# Patient Record
Sex: Male | Born: 1960
Health system: Southern US, Community
[De-identification: ages and names within clinical notes are randomized; demographics above are authoritative.]

## PROBLEM LIST (undated history)

## (undated) DIAGNOSIS — Z87442 Personal history of urinary calculi: Secondary | ICD-10-CM

## (undated) DIAGNOSIS — K37 Unspecified appendicitis: Secondary | ICD-10-CM

## (undated) DIAGNOSIS — K56609 Unspecified intestinal obstruction, unspecified as to partial versus complete obstruction: Secondary | ICD-10-CM

## (undated) DIAGNOSIS — I10 Essential (primary) hypertension: Secondary | ICD-10-CM

## (undated) DIAGNOSIS — K259 Gastric ulcer, unspecified as acute or chronic, without hemorrhage or perforation: Secondary | ICD-10-CM

## (undated) DIAGNOSIS — K219 Gastro-esophageal reflux disease without esophagitis: Secondary | ICD-10-CM

## (undated) HISTORY — PX: KIDNEY STONE SURGERY: SHX686

## (undated) HISTORY — PX: LITHOTRIPSY: SUR834

## (undated) HISTORY — PX: APPENDECTOMY: SHX54

## (undated) HISTORY — DX: Unspecified intestinal obstruction, unspecified as to partial versus complete obstruction: K56.609

## (undated) HISTORY — DX: Unspecified appendicitis: K37

## (undated) HISTORY — PX: ESOPHAGEAL DILATION: SHX303

## (undated) HISTORY — PX: OTHER SURGICAL HISTORY: SHX169

## (undated) HISTORY — DX: Gastro-esophageal reflux disease without esophagitis: K21.9

## (undated) HISTORY — PX: CHOLECYSTECTOMY: SHX55

---

## 1997-09-23 ENCOUNTER — Emergency Department (HOSPITAL_COMMUNITY): Admission: EM | Admit: 1997-09-23 | Discharge: 1997-09-23 | Payer: Self-pay | Admitting: Emergency Medicine

## 1998-08-21 ENCOUNTER — Emergency Department (HOSPITAL_COMMUNITY): Admission: EM | Admit: 1998-08-21 | Discharge: 1998-08-21 | Payer: Self-pay | Admitting: Emergency Medicine

## 1998-08-21 ENCOUNTER — Encounter: Payer: Self-pay | Admitting: Emergency Medicine

## 1999-01-06 ENCOUNTER — Emergency Department (HOSPITAL_COMMUNITY): Admission: EM | Admit: 1999-01-06 | Discharge: 1999-01-06 | Payer: Self-pay | Admitting: Emergency Medicine

## 1999-02-01 ENCOUNTER — Encounter: Payer: Self-pay | Admitting: Emergency Medicine

## 1999-02-01 ENCOUNTER — Emergency Department (HOSPITAL_COMMUNITY): Admission: EM | Admit: 1999-02-01 | Discharge: 1999-02-01 | Payer: Self-pay | Admitting: Emergency Medicine

## 1999-04-29 ENCOUNTER — Encounter: Payer: Self-pay | Admitting: Internal Medicine

## 1999-04-29 ENCOUNTER — Emergency Department (HOSPITAL_COMMUNITY): Admission: EM | Admit: 1999-04-29 | Discharge: 1999-04-29 | Payer: Self-pay | Admitting: Internal Medicine

## 1999-06-12 ENCOUNTER — Encounter: Payer: Self-pay | Admitting: Orthopaedic Surgery

## 1999-06-12 ENCOUNTER — Ambulatory Visit (HOSPITAL_COMMUNITY): Admission: RE | Admit: 1999-06-12 | Discharge: 1999-06-12 | Payer: Self-pay | Admitting: Orthopaedic Surgery

## 2003-05-24 ENCOUNTER — Ambulatory Visit (HOSPITAL_COMMUNITY): Admission: RE | Admit: 2003-05-24 | Discharge: 2003-05-24 | Payer: Self-pay | Admitting: Urology

## 2003-06-01 ENCOUNTER — Emergency Department (HOSPITAL_COMMUNITY): Admission: EM | Admit: 2003-06-01 | Discharge: 2003-06-02 | Payer: Self-pay | Admitting: Emergency Medicine

## 2003-06-02 ENCOUNTER — Ambulatory Visit (HOSPITAL_COMMUNITY): Admission: RE | Admit: 2003-06-02 | Discharge: 2003-06-02 | Payer: Self-pay | Admitting: Urology

## 2003-11-08 ENCOUNTER — Ambulatory Visit (HOSPITAL_COMMUNITY): Admission: RE | Admit: 2003-11-08 | Discharge: 2003-11-08 | Payer: Self-pay | Admitting: Urology

## 2004-09-17 ENCOUNTER — Inpatient Hospital Stay (HOSPITAL_COMMUNITY): Admission: EM | Admit: 2004-09-17 | Discharge: 2004-09-22 | Payer: Self-pay | Admitting: Emergency Medicine

## 2007-02-17 ENCOUNTER — Emergency Department (HOSPITAL_COMMUNITY): Admission: EM | Admit: 2007-02-17 | Discharge: 2007-02-17 | Payer: Self-pay | Admitting: Emergency Medicine

## 2007-03-18 ENCOUNTER — Ambulatory Visit (HOSPITAL_BASED_OUTPATIENT_CLINIC_OR_DEPARTMENT_OTHER): Admission: RE | Admit: 2007-03-18 | Discharge: 2007-03-18 | Payer: Self-pay | Admitting: Urology

## 2009-12-10 ENCOUNTER — Inpatient Hospital Stay (HOSPITAL_COMMUNITY): Admission: EM | Admit: 2009-12-10 | Discharge: 2009-12-12 | Payer: Self-pay | Admitting: Emergency Medicine

## 2009-12-10 ENCOUNTER — Ambulatory Visit: Payer: Self-pay | Admitting: Infectious Diseases

## 2010-02-26 ENCOUNTER — Emergency Department (HOSPITAL_COMMUNITY)
Admission: EM | Admit: 2010-02-26 | Discharge: 2010-02-26 | Payer: Self-pay | Source: Home / Self Care | Admitting: Emergency Medicine

## 2010-05-07 LAB — POCT I-STAT, CHEM 8
BUN: 17 mg/dL (ref 6–23)
Calcium, Ion: 1.37 mmol/L — ABNORMAL HIGH (ref 1.12–1.32)
Chloride: 106 meq/L (ref 96–112)
Creatinine, Ser: 1.5 mg/dL (ref 0.4–1.5)
Glucose, Bld: 126 mg/dL — ABNORMAL HIGH (ref 70–99)
HCT: 46 % (ref 39.0–52.0)
Hemoglobin: 15.6 g/dL (ref 13.0–17.0)
Potassium: 4 mEq/L (ref 3.5–5.1)
Sodium: 139 meq/L (ref 135–145)
TCO2: 26 mmol/L (ref 0–100)

## 2010-05-07 LAB — CSF CELL COUNT WITH DIFFERENTIAL
RBC Count, CSF: 2725 /mm3 — ABNORMAL HIGH
Tube #: 1
WBC, CSF: 2 /mm3 (ref 0–5)

## 2010-05-07 LAB — COMPREHENSIVE METABOLIC PANEL
BUN: 16 mg/dL (ref 6–23)
CO2: 27 mEq/L (ref 19–32)
Chloride: 105 mEq/L (ref 96–112)
Creatinine, Ser: 1.56 mg/dL — ABNORMAL HIGH (ref 0.4–1.5)
GFR calc non Af Amer: 48 mL/min — ABNORMAL LOW (ref >60)
Glucose, Bld: 122 mg/dL — ABNORMAL HIGH (ref 70–99)
Total Bilirubin: 0.7 mg/dL (ref 0.3–1.2)

## 2010-05-07 LAB — B. BURGDORFI ANTIBODIES BY WB
B burgdorferi IgG Abs (IB): NEGATIVE
B burgdorferi IgM Abs (IB): NEGATIVE

## 2010-05-07 LAB — POCT I-STAT 3, VENOUS BLOOD GAS (G3P V)
Acid-base deficit: 1 mmol/L (ref 0.0–2.0)
Bicarbonate: 24.1 mEq/L — ABNORMAL HIGH (ref 20.0–24.0)
O2 Saturation: 72 %
Patient temperature: 97.4
TCO2: 25 mmol/L (ref 0–100)
pCO2, Ven: 40.6 mmHg — ABNORMAL LOW (ref 45.0–50.0)
pH, Ven: 7.378 — ABNORMAL HIGH (ref 7.250–7.300)
pO2, Ven: 37 mmHg (ref 30.0–45.0)

## 2010-05-07 LAB — GC/CHLAMYDIA PROBE AMP, URINE
Chlamydia, Swab/Urine, PCR: NEGATIVE
GC Probe Amp, Urine: NEGATIVE

## 2010-05-07 LAB — GRAM STAIN

## 2010-05-07 LAB — CBC
HCT: 37.9 % — ABNORMAL LOW (ref 39.0–52.0)
Hemoglobin: 14.9 g/dL (ref 13.0–17.0)
MCH: 30.8 pg (ref 26.0–34.0)
MCV: 88.8 fL (ref 78.0–100.0)
MCV: 90.7 fL (ref 78.0–100.0)
RBC: 4.84 MIL/uL (ref 4.22–5.81)
RDW: 13 % (ref 11.5–15.5)
WBC: 5.6 10*3/uL (ref 4.0–10.5)

## 2010-05-07 LAB — TSH: TSH: 0.856 u[IU]/mL (ref 0.350–4.500)

## 2010-05-07 LAB — RPR: RPR Ser Ql: NONREACTIVE

## 2010-05-07 LAB — PROCALCITONIN: Procalcitonin: <0.1 ng/mL

## 2010-05-07 LAB — T3, FREE: T3, Free: 2.2 pg/mL — ABNORMAL LOW (ref 2.3–4.2)

## 2010-05-07 LAB — CSF CULTURE W GRAM STAIN: Culture: NO GROWTH

## 2010-05-07 LAB — RAPID URINE DRUG SCREEN, HOSP PERFORMED
Amphetamines: NOT DETECTED
Barbiturates: NOT DETECTED
Benzodiazepines: NOT DETECTED
Cocaine: NOT DETECTED
Opiates: NOT DETECTED
Tetrahydrocannabinol: NOT DETECTED

## 2010-05-07 LAB — CARDIAC PANEL(CRET KIN+CKTOT+MB+TROPI)
Relative Index: 0.9 (ref 0.0–2.5)
Troponin I: <0.01 ng/mL (ref 0.00–0.06)
Troponin I: <0.01 ng/mL (ref 0.00–0.06)

## 2010-05-07 LAB — DIFFERENTIAL
Basophils Absolute: 0 10*3/uL (ref 0.0–0.1)
Eosinophils Relative: 3 % (ref 0–5)
Lymphocytes Relative: 22 % (ref 12–46)
Neutro Abs: 4.4 10*3/uL (ref 1.7–7.7)
Neutrophils Relative %: 67 % (ref 43–77)

## 2010-05-07 LAB — ACETAMINOPHEN LEVEL: Acetaminophen (Tylenol), Serum: <10 ug/mL — ABNORMAL LOW (ref 10–30)

## 2010-05-07 LAB — URINALYSIS, ROUTINE W REFLEX MICROSCOPIC
Ketones, ur: NEGATIVE mg/dL
Nitrite: NEGATIVE
pH: 7 (ref 5.0–8.0)

## 2010-05-07 LAB — POCT CARDIAC MARKERS
CKMB, poc: <1 ng/mL — ABNORMAL LOW (ref 1.0–8.0)
Myoglobin, poc: 45.5 ng/mL (ref 12–200)
Troponin i, poc: <0.05 ng/mL (ref 0.00–0.09)

## 2010-05-07 LAB — PROTEIN AND GLUCOSE, CSF
Glucose, CSF: 60 mg/dL (ref 43–76)
Total  Protein, CSF: 58 mg/dL — ABNORMAL HIGH (ref 15–45)

## 2010-05-07 LAB — CULTURE, BLOOD (ROUTINE X 2)
Culture  Setup Time: 201110181913
Culture: NO GROWTH

## 2010-05-07 LAB — BASIC METABOLIC PANEL
BUN: 11 mg/dL (ref 6–23)
Chloride: 110 mEq/L (ref 96–112)
Creatinine, Ser: 1.24 mg/dL (ref 0.4–1.5)

## 2010-05-07 LAB — SODIUM, URINE, RANDOM: Sodium, Ur: 87 mEq/L

## 2010-05-07 LAB — ROCKY MTN SPOTTED FVR AB, IGG-BLOOD: RMSF IgG: 0.13 IV

## 2010-05-07 LAB — B. BURGDORFI ANTIBODIES: B burgdorferi Ab IgG+IgM: 1.35 {ISR} — ABNORMAL HIGH

## 2010-05-07 LAB — SALICYLATE LEVEL: Salicylate Lvl: <4 mg/dL (ref 2.8–20.0)

## 2010-05-07 LAB — ROCKY MTN SPOTTED FVR AB, IGM-BLOOD: RMSF IgM: 0.14 IV (ref 0.00–0.89)

## 2010-05-07 LAB — CK TOTAL AND CKMB (NOT AT ARMC)
CK, MB: 1.5 ng/mL (ref 0.3–4.0)
Relative Index: 0.9 (ref 0.0–2.5)
Total CK: 166 U/L (ref 7–232)

## 2010-05-07 LAB — PROTIME-INR: Prothrombin Time: 13.5 seconds (ref 11.6–15.2)

## 2010-05-07 LAB — TROPONIN I: Troponin I: <0.01 ng/mL (ref 0.00–0.06)

## 2010-05-07 LAB — MAGNESIUM: Magnesium: 2.1 mg/dL (ref 1.5–2.5)

## 2010-05-07 LAB — SEDIMENTATION RATE: Sed Rate: 11 mm/h (ref 0–16)

## 2010-05-07 LAB — ETHANOL: Alcohol, Ethyl (B): <5 mg/dL (ref 0–10)

## 2010-05-07 LAB — LACTIC ACID, PLASMA: Lactic Acid, Venous: 1.9 mmol/L (ref 0.5–2.2)

## 2010-05-07 LAB — CREATININE, URINE, RANDOM: Creatinine, Urine: 90.4 mg/dL

## 2010-05-07 LAB — T4, FREE: Free T4: 0.77 ng/dL — ABNORMAL LOW (ref 0.80–1.80)

## 2010-05-24 ENCOUNTER — Emergency Department (HOSPITAL_COMMUNITY)
Admission: EM | Admit: 2010-05-24 | Discharge: 2010-05-24 | Disposition: A | Discharge status: Home / Self Care | Payer: Self-pay | Attending: Emergency Medicine | Admitting: Emergency Medicine

## 2010-05-24 ENCOUNTER — Encounter: Payer: Self-pay | Admitting: Internal Medicine

## 2010-05-24 ENCOUNTER — Emergency Department (HOSPITAL_COMMUNITY): Payer: Self-pay

## 2010-05-24 DIAGNOSIS — K2289 Other specified disease of esophagus: Secondary | ICD-10-CM | POA: Insufficient documentation

## 2010-05-24 DIAGNOSIS — K228 Other specified diseases of esophagus: Secondary | ICD-10-CM

## 2010-05-24 DIAGNOSIS — K222 Esophageal obstruction: Secondary | ICD-10-CM

## 2010-05-24 DIAGNOSIS — X58XXXA Exposure to other specified factors, initial encounter: Secondary | ICD-10-CM | POA: Insufficient documentation

## 2010-05-24 LAB — CBC
HCT: 44.7 % (ref 39.0–52.0)
Hemoglobin: 16 g/dL (ref 13.0–17.0)
MCH: 31.3 pg (ref 26.0–34.0)
MCV: 87.3 fL (ref 78.0–100.0)
RBC: 5.12 MIL/uL (ref 4.22–5.81)

## 2010-05-24 LAB — DIFFERENTIAL
Lymphocytes Relative: 9 % — ABNORMAL LOW (ref 12–46)
Lymphs Abs: 1.7 10*3/uL (ref 0.7–4.0)
Monocytes Relative: 5 % (ref 3–12)
Neutro Abs: 16 10*3/uL — ABNORMAL HIGH (ref 1.7–7.7)
Neutrophils Relative %: 85 % — ABNORMAL HIGH (ref 43–77)

## 2010-05-24 LAB — GLUCOSE, CAPILLARY: Glucose-Capillary: 106 mg/dL — ABNORMAL HIGH (ref 70–99)

## 2010-05-24 LAB — POCT I-STAT, CHEM 8
BUN: 15 mg/dL (ref 6–23)
Calcium, Ion: 1.3 mmol/L (ref 1.12–1.32)
Chloride: 106 mEq/L (ref 96–112)
Sodium: 139 mEq/L (ref 135–145)

## 2010-05-27 NOTE — Procedures (Signed)
Summary: Upper Endoscopy  Patient: Gregory Sweeney Note: All result statuses are Final unless otherwise noted.  Tests: (1) Upper Endoscopy (EGD)   EGD Upper Endoscopy       DONE     [facility]     [address]     [place], [place]  [address]          ENDOSCOPY PROCEDURE REPORT          PATIENT:  Gregory Sweeney, Gregory Sweeney  MR#:  [id number]     BIRTHDATE:  03/30/1965, 45 yrs. old  GENDER:  male          ENDOSCOPIST:  Wilhemina Bonito. Eda Keys, MD     Referred by:  Oris Drone. Hyacinth Meeker, M.D.          PROCEDURE DATE:  05/24/2010     PROCEDURE:  EGD, diagnostic 43235     ASA CLASS:  Class I     INDICATIONS:  food impaction          MEDICATIONS:   Fentanyl 75 mcg IV, Versed 7 mg IV     TOPICAL ANESTHETIC:  Cetacaine Spray          DESCRIPTION OF PROCEDURE:   After the risks benefits and     alternatives of the procedure were thoroughly explained, informed     consent was obtained.  The EG-[id number] ([id number]) endoscope was     introduced through the mouth and advanced to the second portion of     the duodenum, without limitations.  The instrument was slowly     withdrawn as the mucosa was fully examined.     <<PROCEDUREIMAGES>>          There was a laceration of the mucosa in the mid esophagus. No food     impaction (must have passed).  A benign ring-like 14mm stricture     was found in the distal esophagus.  The stomach was entered and     closely examined. The antrum, angularis, and lesser curvature were     well visualized, including a retroflexed view of the cardia and     fundus. The stomach wall was normally distensable. The scope     passed easily through the pylorus into the duodenum.  The duodenal     bulb was normal in appearance, as was the postbulbar duodenum.     Retroflexed views revealed a small hiatal hernia.    The scope was     then withdrawn from the patient and the procedure completed.          COMPLICATIONS:  None          ENDOSCOPIC IMPRESSION:     1)  Laceration in the mid esophagus secondary to retching     2) Benign Stricture in the distal esophagus     3) Normal stomach     4) Normal duodenum          RECOMMENDATIONS:     1) Clear liquids until noon tomorrow, then soft foods rest of     day. Resume prior diet Monday.     2) Call office next 2-3 days to schedule an appointment for     Endoscopy with esophageal dilation in about 4 weeks          ______________________________     Wilhemina Bonito. Eda Keys, MD          CC:  The Patient;  Patty Sermons  n.     eSIGNED:   Wilhemina Bonito. Eda Keys at 05/24/2010 10:43 PM          Latanya Maudlin, [id number]  Note: An exclamation mark (!) indicates a result that was not dispersed into the flowsheet. Document Creation Date: 05/24/2010 10:44 PM _______________________________________________________________________  (1) Order result status: Final Collection or observation date-time: 05/24/2010 22:33 Requested date-time:  Receipt date-time:  Reported date-time:  Referring Physician:   Ordering Physician: Fransico Setters [id number]) Specimen Source:  Source: Launa Grill Order Number: [id number] Lab site:

## 2010-06-12 ENCOUNTER — Telehealth: Payer: Self-pay

## 2010-06-12 ENCOUNTER — Encounter: Payer: Self-pay | Admitting: Internal Medicine

## 2010-06-12 NOTE — Telephone Encounter (Signed)
Thank you :)

## 2010-06-12 NOTE — Telephone Encounter (Signed)
Pt was supposed to be scheduled for an egd with esophageal dil following procedure done 05/24/10 in 4 weeks. Aftermultiple attempts have been unable to reach the patient. Have left multiple messages but have received no return phone call. Letter mailed to pt stating for them to call to schedule an appt.

## 2010-06-20 ENCOUNTER — Encounter: Payer: Self-pay | Admitting: Internal Medicine

## 2010-06-24 DEATH — deceased

## 2010-07-08 NOTE — Op Note (Signed)
NAME:  Gregory Sweeney, Gregory Sweeney NO.:  [id number]   MEDICAL RECORD NO.:  [id number]          PATIENT TYPE:  AMB   LOCATION:  [facility]                         FACILITY:  [facility]   PHYSICIAN:  Lindaann Slough, M.D.  DATE OF BIRTH:  03/30/1965   DATE OF PROCEDURE:  03/18/2007  DATE OF DISCHARGE:                               OPERATIVE REPORT   PREOPERATIVE DIAGNOSIS:  Bladder calculus.   POSTOPERATIVE DIAGNOSIS:  Bladder calculus.   PROCEDURE:  1. Cystoscopy.  2. Holmium laser of the bladder stone.  3. Stone extraction.   SURGEON:  Danae Chen, M.D.   ANESTHESIA:  General.   INDICATIONS:  The patient is a 50 year old male who has a history of  kidney stones.  He passed a stone at the end of December 2008.  Then he  started having difficulty voiding.  Cystoscopy to rule out a urethral  calculus showed a 15 mm calculus at the bladder neck.  The patient has  continued to complain of difficulty voiding.  He is scheduled today for  cystoscopy, holmium laser of the bladder stone and stone extraction.   DESCRIPTION OF PROCEDURE:  Under general anesthesia the patient was  prepped and draped and placed in the dorsal lithotomy position.  A  panendoscope was inserted in the bladder.  The anterior urethra is  normal.  He has moderate prostatic hypertrophy.  There is a 15 mm  calculus in the bladder.  There is no tumor in the bladder.  The  ureteral orifices are in normal position and shape with clear efflux.  With the 375 micro fiber holmium laser, the stone was fragmented.  The  stone fragments were then removed with nitinol basket.  There was no  evidence of remaining stone fragment in the bladder.  The bladder was  then irrigated with normal saline.   The bladder was then emptied and the cystoscope removed.   The patient tolerated the procedure well and left the OR in satisfactory  condition to post anesthesia care unit.      Lindaann Slough, M.D.  Electronically  Signed    MN/MEDQ  D:  03/18/2007  T:  03/18/2007  Job:  [id number]   cc:   Renaye Rakers, M.D.  Fax: [phone]

## 2010-07-11 NOTE — Op Note (Signed)
NAME:  Gregory Sweeney, Gregory Sweeney                          ACCOUNT NO.:  [id number]   MEDICAL RECORD NO.:  [id number]                   PATIENT TYPE:  AMB   LOCATION:  DAY                                  FACILITY:  [facility]   PHYSICIAN:  Jamison Neighbor, M.D.               DATE OF BIRTH:  03/30/1965   DATE OF PROCEDURE:  06/02/2003  DATE OF DISCHARGE:                                 OPERATIVE REPORT   SERVICE:  Urology.   PREOPERATIVE DIAGNOSES:  Left ureteral fragment secondary to ESWL with  associated hydronephrosis.   POSTOPERATIVE DIAGNOSES:  Left ureteral fragment secondary to ESWL with  associated hydronephrosis.   PROCEDURE:  Cystoscopy, left retrograde, left ureteroscopy, left basket  extraction including a ureteral meatotomy, left double J catheter insertion.   SURGEON:  Jamison Neighbor, M.D.   ANESTHESIA:  General.   COMPLICATIONS:  None.   DRAINS:  7 French x 26 cm double J catheter.   BRIEF HISTORY:  This 50 year old male underwent ESWL earlier in the week by  Dr. Brunilda Payor. There appeared to be good fragmentation of the stones but the  patient did develop some significant pain. He presented to the emergency  room last night where a CT scan revealed the presence of a large stone in  the ureter as well as some stone fragments in the more proximal ureter as  well as in the kidney.  The patient had enough pain that he felt that he  would like to have this bypassed and/or extracted. He understands the risks  and benefits of the procedure and gave full and informed consent.   DESCRIPTION OF PROCEDURE:  After successful induction of general anesthesia,  the patient was placed in the dorsal lithotomy position, prepped with  Betadine and draped in the usual sterile fashion.  Cystoscopy was performed,  the urethra was visualized in its entirety and was found to be normal.  Beyond the verumontanum, there was no significant prostatic enlargement. The  bladder itself was carefully inspected  and was free of any tumor stones.  Both ureteral orifices were normal in configuration and location.  Retrograde study was performed and this showed a normal distal ureter and a  more dilated proximal ureter and what appeared to be a good size lead stone  fragment in the mid ureter. The guidewire was passed up to the kidney. The  10 cm balloon dilator was used to dilate the distal ureter in preparation  for ureteroscopy and attempt at extraction.  The ureteroscope was then  inserted alongside the guidewire and a large stone with several smaller  stone fragments was identified in the mid ureter. The small stone fragments  were flushed out of the ureter.  The large stone fragment was grasped under  direct vision and pulled all the way down to the ureteral meatus where it  got stuck and would not come through even though that ureteral  opening had  been dilated.  The basket was cut so that the handle could be removed, the  ureteroscope was then withdrawn. The cystoscope was reinserted alongside  both the guidewire and this basket with the handle cut off. Meatotomy  scissors were used to male a small nick in the meatus which allowed the  stone fragment to come out of the ureter, it was removed and will be sent as  a specimen.  The ureteroscope was then reintroduced and the remainder of the  ureter was identified as far as the scope could be inserted.  No other stone  fragments could be seen. The ureter was wide open and no significant injury  to the ureter had occurred. The patient has had enough problems it was  thought that a stent should be placed.  The 7 French x 26 cm stent was then  passed over the guidewire without any difficulty. This coiled normally in  the pelvis as well as in the bladder. The patient tolerated the procedure  well and was taken to the recovery room in good condition and will be sent  home with Tylox, Pyridium plus and Septra DS and return to see Dr. Brunilda Payor in a  week or  so for stent removal.                                               Jamison Neighbor, M.D.    RJE/MEDQ  D:  06/02/2003  T:  06/02/2003  Job:  [id number]   cc:   Lindaann Slough, M.D.  [address]  [place]  [place] [address]  Fax: [phone]

## 2010-07-11 NOTE — Discharge Summary (Signed)
NAME:  Gregory Sweeney, Gregory Sweeney NO.:  [id number]   MEDICAL RECORD NO.:  [id number]          PATIENT TYPE:  INP   LOCATION:  1606                         [facility]ACILITY:  [facility]   PHYSICIAN:  Angelia Mould. Derrell Lolling, M.D.DATE OF BIRTH:  03/30/1965   DATE OF ADMISSION:  09/17/2004  DATE OF DISCHARGE:  09/22/2004                                 DISCHARGE SUMMARY   DISCHARGE DIAGNOSES:  1.  Nausea and vomiting, either due to partial small-bowel obstruction or      ileus, resolved.  2.  Status post gastric surgery for peptic ulcer disease.  3.  Status post open cholecystectomy.  4.  Status post two laparotomies for small-bowel obstruction.  5.  Status post appendectomy.  6.  History of kidney stones.   PROCEDURES:  None.   HISTORY OF PRESENT ILLNESS:  This is a 50 year old, white man who has had  multiple abdominal surgeries in the past.  He complained of diffuse  abdominal pain for 4-5 days with daily vomiting of bilious fluid.  He also  states he had been having multiple loose bowel movements daily, although  these and had not been high in volume.  He saw Dr. Renaye Rakers in her office  yesterday.  He was placed on proton pump inhibitors and the patient states  that he was going to see her gastroenterologist electively, but he felt bad  and simply came to the emergency room.  He was evaluated by Dr. Doug Sou who felt he had diffuse abdominal tenderness and ileus versus  small-bowel obstruction.  A CT scan apparently suggested partial small-bowel  obstruction of the distal small bowel and one loop of small bowel being more  distended than others.  There was no inflammatory change, no free air, no  free fluid, no hernia noted.  A nasogastric tube was placed. The patient was  given analgesics and after all that was done I was called to evaluate him.  He was admitted for further evaluation and management.   For details his past history, social history, family history, review of  systems, please see the dictated admission note.   PHYSICAL EXAMINATION:  GENERAL:  A pleasant, middle-aged, white man in  minimal distress.  VITAL SIGNS:  Temperature 99.4, pulse 82, respirations 18, blood pressure  122/79.  NECK:  Supple, nontender, no adenopathy. No jugular distension.  Nasogastric  tube was in place.  LUNGS:  Clear to auscultation.  No chest wall tender.  HEART:  Regular rate and rhythm murmur.  ABDOMEN:  Somewhat distended, but soft.  Mild diffuse tenderness with no  guarding or peritoneal signs.  No mass.  No hernia.  Midline scar and right  subcostal scar noted.  I don't feel any inguinal hernias or ventral hernias.   LABORATORY DATA AND X-RAY FINDINGS:  White blood cell count 8600, normal  differential.  Basic metabolic panel normal.   X-rays were described above.   HOSPITAL COURSE:  The patient was admitted for observation and nonoperative  management initially.  He slowly improved.  The urine culture was negative.  A stool for white blood  cells was negative.  Stool for C. difficile was  negative.  Stool for routine pathogens and culture was negative.   Over the next 48 hours, he began feeling better.  His x-rays improved  showing that the small bowel distension resolved and the contrast of the CT  scan moved into his colon quite nicely.  His lab work remained normal.  He  still felt a little tight, but was passing flatus.  We took his NG tube out  on September 20, 2004, and advanced his diet slowly thereafter which she  tolerated just fine.  We considered doing small bowel follow through if his  symptoms recurred, but he moved on to a solid diet without any symptoms  whatsoever and so he was discharged home on September 22, 2004.   FOLLOW UP:  He was asked to follow up with Dr. Parke Simmers and possibly consider  referral to gastroenterology.  He was asked to follow up with me p.r.n.       HMI/MEDQ  D:  09/29/2004  T:  09/29/2004  Job:  [id number]   cc:   Renaye Rakers,  M.D.  [phone] [address]  [place]  [place] [address]  Fax: [phone]

## 2010-07-11 NOTE — H&P (Signed)
NAME:  Gregory Sweeney, Gregory Sweeney NO.:  [id number]   MEDICAL RECORD NO.:  [id number]          PATIENT TYPE:  EMS   LOCATION:  ED                           FACILITY[facility]   PHYSICIAN:  Angelia Mould. Derrell Lolling, M.D.DATE OF BIRTH:  03/30/1965   DATE OF ADMISSION:  09/17/2004  DATE OF DISCHARGE:                                HISTORY & PHYSICAL   CHIEF COMPLAINT:  Abdominal pain with repeated vomiting and repeated  diarrhea.   HISTORY OF PRESENT ILLNESS:  This is a 50 year old white man who has had  multiple abdominal operations in the past.  He complains of diffuse  abdominal pain for the past 4-5 days.  He states that he has been vomiting  daily, bilious-type fluid.  He also states that he has been having multiple  loose bowel movements per day, although not high-volume.  They have been  every day and several a day.  He saw Dr. Ocie Bob yesterday.  The  diagnosis was not clear.  He was placed on proton pump inhibitors and  referred to a gastroenterologist that he has not seen yet.  Tonight, he felt  bad and came to the emergency room and saw Dr. Doug Sou, who felt that  he had diffuse abdominal tenderness.  Abdominal x-rays suggested ileus  versus small bowel obstruction.  CT scan suggested more likely small bowel  obstruction of the distal small bowel, and one loop of small bowel is more  distended than the others.  There are no inflammatory changes.  No free air.  There is no free fluid.  No hernia was noted on the CT scan.  I was called.  The nasogastric tube was placed.  Patient was given some analgesics, and he  feels much better now.   PAST MEDICAL HISTORY:  He has had some type of gastric surgery for peptic  ulcer at age 72, possibly a partial gastrectomy.  He has had a laparotomy  for small bowel obstruction at age 50.  He states that he had an  appendectomy at age 8.  A laparotomy for small bowel obstruction at age 7.  An open cholecystectomy at age 74.  He  has a history of kidney stones.  Otherwise, no medical or surgical problems.   CURRENT MEDICATIONS:  He has been taking proton pump inhibitors for the past  24 hours but really does not take any chronic medications.   DRUG ALLERGIES:  1.  AMPICILLIN.  2.  MORPHINE.   SOCIAL HISTORY:  The patient has lived in [place] for th past 10 years.  He has no children.  He is engaged to be married, and his fiancee is here  with him.  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  He denies  the use of alcohol or tobacco.   FAMILY HISTORY:  Mother died at age 62, unknown cause.  Father died.  Was  hypertensive and a stroke.  He has one brother and one sister who are all  living and well.   REVIEW OF SYSTEMS:  A 15-system review of systems is  performed.  It is  noncontributory except as described above.   PHYSICAL EXAMINATION:  VITAL SIGNS:  Temp 99.4, pulse 82, respirations 18,  blood pressure 122/79.  GENERAL:  A pleasant, middle-aged black male who appears fit and does not  appear to be in much distress at all.  HEENT:  Eyes: Sclerae are clear.  Extraocular movements are intact.  Ears,  nose, mouth, throat, lips, tongue, and oropharynx are without gross lesions.  Mucous membranes are moist.  NECK:  Supple.  Nontender.  No thyroid mass or adenopathy.  No jugular  venous distention.  LUNGS:  Clear to auscultation.  No chest wall tenderness.  HEART:  Regular rate and rhythm.  No murmur.  No tachycardia.  ABDOMEN:  Distended 1+.  Soft.  Mild diffuse tenderness but really no  guarding, peritoneal signs, or rebound.  I do not feel a mass.  There is a  midline scar.  There is a right subcostal scar.  I do not feel any hernias  in the abdominal scars, and I do not feel any inguinal hernias.  EXTREMITIES:  He moves all four extremities well without pain or deformity.  NEUROLOGIC:  No gross motor or sensory deficits.   ADMISSION DATA:  White blood cell count 8600 with a normal  differential.  Basic metabolic panel normal.   X-ray exams are as above.   ASSESSMENT:  1.  Abdominal pain, vomiting, and diarrhea:  Most likely, this is some form      of a partial small bowel obstruction.  The diarrhea may be a      manifestation of this or may be unrelated.  2.  Status post gastric surgeries for peptic ulcer disease.  3.  Status post open cholecystectomy.  4.  Status post two laparotomies for small bowel obstruction.  5.  Status post appendectomy.  6.  History of kidney stones.   PLAN:  The patient will be admitted.  Nasogastric suction has already been  instituted.  We will keep him on proton pump inhibitors and allow some  analgesics.  We will repeat his lab work and his exam and x-rays in the  morning.   I told him we may be able to resolve this with nonoperative methods, but if  he continued to be obstructed or developed significant pain or leukocytosis  and if there ever became a suspicion of compromised bowel, he would need  another laparotomy.  He is comfortable with this plan.       HMI/MEDQ  D:  09/17/2004  T:  09/17/2004  Job:  [id number]   cc:   Renaye Rakers, M.D.  [phone] [address], [address]  [place]  [place] [address]  Fax: [phone]

## 2010-09-07 ENCOUNTER — Emergency Department (HOSPITAL_COMMUNITY): Payer: Self-pay

## 2010-09-07 ENCOUNTER — Emergency Department (HOSPITAL_COMMUNITY)
Admission: EM | Admit: 2010-09-07 | Discharge: 2010-09-07 | Disposition: A | Discharge status: Home / Self Care | Payer: Self-pay | Attending: Emergency Medicine | Admitting: Emergency Medicine

## 2010-09-07 DIAGNOSIS — IMO0002 Reserved for concepts with insufficient information to code with codable children: Secondary | ICD-10-CM | POA: Insufficient documentation

## 2010-09-07 DIAGNOSIS — K315 Obstruction of duodenum: Secondary | ICD-10-CM | POA: Insufficient documentation

## 2010-09-07 DIAGNOSIS — X58XXXA Exposure to other specified factors, initial encounter: Secondary | ICD-10-CM | POA: Insufficient documentation

## 2010-09-07 DIAGNOSIS — R109 Unspecified abdominal pain: Secondary | ICD-10-CM | POA: Insufficient documentation

## 2010-09-07 DIAGNOSIS — S1120XA Unspecified open wound of pharynx and cervical esophagus, initial encounter: Secondary | ICD-10-CM | POA: Insufficient documentation

## 2010-09-07 DIAGNOSIS — T18108A Unspecified foreign body in esophagus causing other injury, initial encounter: Secondary | ICD-10-CM | POA: Insufficient documentation

## 2010-09-07 DIAGNOSIS — K222 Esophageal obstruction: Secondary | ICD-10-CM

## 2010-09-07 LAB — DIFFERENTIAL
Eosinophils Absolute: 0.5 10*3/uL (ref 0.0–0.7)
Eosinophils Relative: 5 % (ref 0–5)
Lymphs Abs: 2.4 10*3/uL (ref 0.7–4.0)
Monocytes Relative: 5 % (ref 3–12)

## 2010-09-07 LAB — BASIC METABOLIC PANEL
BUN: 18 mg/dL (ref 6–23)
CO2: 26 mEq/L (ref 19–32)
Calcium: 11.1 mg/dL — ABNORMAL HIGH (ref 8.4–10.5)
Creatinine, Ser: 1.28 mg/dL (ref 0.50–1.35)

## 2010-09-07 LAB — CBC
HCT: 45.9 % (ref 39.0–52.0)
MCH: 31.1 pg (ref 26.0–34.0)
MCV: 87.6 fL (ref 78.0–100.0)
Platelets: 221 10*3/uL (ref 150–400)
RBC: 5.24 MIL/uL (ref 4.22–5.81)
RDW: 13 % (ref 11.5–15.5)

## 2010-09-09 ENCOUNTER — Telehealth: Payer: Self-pay

## 2010-09-09 NOTE — Telephone Encounter (Signed)
Pt scheduled for EGD with balloon dil for 09/17/10@1 :30pm with Dr. Marina Goodell. Pt aware of appt date and time, instructions mailed to pt.

## 2010-09-09 NOTE — Telephone Encounter (Signed)
Message copied by Michele Mcalpine on Tue Sep 09, 2010  2:36 PM ------      Message from: Melvia Heaps MD D      Created: Sun Sep 07, 2010 10:34 PM       Pt had a food impaction.  He needs a f/u EGD with dilitation with Dr. Marina Goodell (savory or balloon) in 1-2 weeks.

## 2010-09-17 ENCOUNTER — Other Ambulatory Visit: Payer: Self-pay | Admitting: Internal Medicine

## 2010-09-18 ENCOUNTER — Telehealth: Payer: Self-pay | Admitting: Internal Medicine

## 2010-11-05 ENCOUNTER — Other Ambulatory Visit: Payer: Self-pay | Admitting: Internal Medicine

## 2010-11-13 LAB — POCT HEMOGLOBIN-HEMACUE: Operator id: 268271

## 2010-11-28 LAB — BASIC METABOLIC PANEL WITH GFR
CO2: 27
Glucose, Bld: 96
Potassium: 4.5
Sodium: 137

## 2010-11-28 LAB — BASIC METABOLIC PANEL
BUN: 17
Calcium: 10.3
Chloride: 106
Creatinine, Ser: 1.34
GFR calc Af Amer: >60
GFR calc non Af Amer: 57 — ABNORMAL LOW

## 2010-11-28 LAB — URINALYSIS, ROUTINE W REFLEX MICROSCOPIC
Bilirubin Urine: NEGATIVE
Glucose, UA: NEGATIVE
Hgb urine dipstick: NEGATIVE
Specific Gravity, Urine: 1.027
Urobilinogen, UA: 0.2

## 2010-11-28 LAB — URINE CULTURE
Colony Count: NO GROWTH
Culture: NO GROWTH

## 2011-12-13 ENCOUNTER — Emergency Department (HOSPITAL_COMMUNITY): Payer: BC Managed Care – PPO

## 2011-12-13 ENCOUNTER — Encounter (HOSPITAL_COMMUNITY): Payer: Self-pay | Admitting: Emergency Medicine

## 2011-12-13 ENCOUNTER — Emergency Department (HOSPITAL_COMMUNITY)
Admission: EM | Admit: 2011-12-13 | Discharge: 2011-12-13 | Disposition: A | Discharge status: Home / Self Care | Payer: BC Managed Care – PPO | Attending: Emergency Medicine | Admitting: Emergency Medicine

## 2011-12-13 DIAGNOSIS — N200 Calculus of kidney: Secondary | ICD-10-CM

## 2011-12-13 DIAGNOSIS — Z9089 Acquired absence of other organs: Secondary | ICD-10-CM | POA: Insufficient documentation

## 2011-12-13 HISTORY — DX: Gastric ulcer, unspecified as acute or chronic, without hemorrhage or perforation: K25.9

## 2011-12-13 LAB — CBC WITH DIFFERENTIAL/PLATELET
Basophils Absolute: 0 10*3/uL (ref 0.0–0.1)
Basophils Relative: 1 % (ref 0–1)
Eosinophils Absolute: 0.4 10*3/uL (ref 0.0–0.7)
Eosinophils Relative: 4 % (ref 0–5)
HCT: 43.8 % (ref 39.0–52.0)
Hemoglobin: 15.3 g/dL (ref 13.0–17.0)
Lymphocytes Relative: 29 % (ref 12–46)
Lymphs Abs: 2.5 10*3/uL (ref 0.7–4.0)
MCH: 30.8 pg (ref 26.0–34.0)
MCHC: 34.9 g/dL (ref 30.0–36.0)
MCV: 88.3 fL (ref 78.0–100.0)
Monocytes Absolute: 0.6 K/uL (ref 0.1–1.0)
Monocytes Relative: 7 % (ref 3–12)
Neutro Abs: 5.2 K/uL (ref 1.7–7.7)
Neutrophils Relative %: 59 % (ref 43–77)
Platelets: 235 10*3/uL (ref 150–400)
RBC: 4.96 MIL/uL (ref 4.22–5.81)
RDW: 12.7 % (ref 11.5–15.5)
WBC: 8.7 10*3/uL (ref 4.0–10.5)

## 2011-12-13 LAB — URINALYSIS, ROUTINE W REFLEX MICROSCOPIC
Bilirubin Urine: NEGATIVE
Glucose, UA: NEGATIVE mg/dL
Hgb urine dipstick: NEGATIVE
Ketones, ur: NEGATIVE mg/dL
Leukocytes, UA: NEGATIVE
Nitrite: NEGATIVE
Protein, ur: NEGATIVE mg/dL
Specific Gravity, Urine: 1.018 (ref 1.005–1.030)
Urobilinogen, UA: 0.2 mg/dL (ref 0.0–1.0)
pH: 6 (ref 5.0–8.0)

## 2011-12-13 LAB — BASIC METABOLIC PANEL
GFR calc Af Amer: 70 mL/min — ABNORMAL LOW (ref >90)
GFR calc non Af Amer: 61 mL/min — ABNORMAL LOW (ref >90)
Glucose, Bld: 101 mg/dL — ABNORMAL HIGH (ref 70–99)
Potassium: 4.4 mEq/L (ref 3.5–5.1)
Sodium: 136 mEq/L (ref 135–145)

## 2011-12-13 LAB — BASIC METABOLIC PANEL WITH GFR
BUN: 17 mg/dL (ref 6–23)
CO2: 28 meq/L (ref 19–32)
Calcium: 11.6 mg/dL — ABNORMAL HIGH (ref 8.4–10.5)
Chloride: 101 meq/L (ref 96–112)
Creatinine, Ser: 1.31 mg/dL (ref 0.50–1.35)

## 2011-12-13 MED ORDER — NAPROXEN 500 MG PO TABS: 500.0000 mg | ORAL_TABLET | Freq: Two times a day (BID) | ORAL | Status: DC

## 2011-12-13 MED ORDER — OXYCODONE-ACETAMINOPHEN 5-325 MG PO TABS: 1.0000 | ORAL_TABLET | Freq: Four times a day (QID) | ORAL | Status: DC | PRN

## 2011-12-13 MED ORDER — OXYCODONE-ACETAMINOPHEN 5-325 MG PO TABS
1.0000 | ORAL_TABLET | Freq: Once | ORAL | Status: AC
  Administered 2011-12-13: 1 via ORAL
  Filled 2011-12-13: qty 1

## 2011-12-13 NOTE — ED Notes (Signed)
PA at bedside.

## 2011-12-13 NOTE — ED Notes (Signed)
Pt reports, "I think I am passing a kidney stone. Pt c/o left flank pain with dark colored urine onset yesterday. Pt also report nausea.

## 2011-12-13 NOTE — ED Provider Notes (Signed)
Medical screening examination/treatment/procedure(s) were performed by non-physician practitioner and as supervising physician I was immediately available for consultation/collaboration.   Gavin Pound. Bethzaida Boord, MD 12/13/11 1226

## 2011-12-13 NOTE — ED Provider Notes (Signed)
History     CSN: [id number]  Arrival date & time 12/13/11  4098   First MD Initiated Contact with Patient 12/13/11 [phone]      Chief Complaint  Patient presents with  . Nephrolithiasis  . Flank Pain    (Consider location/radiation/quality/duration/timing/severity/associated sxs/prior treatment) HPI Comments: Patient with a history of kidney stones presents emergency department complaining of left flank pain.  Onset of symptoms began yesterday afternoon and are described as a sharp intense pain is worsened with movement.  Patient denies radiation.  Severity 8/10.  Associated symptoms include dark colored urine and nausea.  Patient denies fever, night sweats, chills, dysuria, hematuria, abdominal pain, trauma or back pain.  Patient is a 51 y.o. male presenting with flank pain. The history is provided by the patient.  Flank Pain Pertinent negatives include no abdominal pain, chest pain, chills, coughing, diaphoresis, fatigue, fever, headaches, nausea, rash or vomiting.    Past Medical History  Diagnosis Date  . Kidney stone   . Gastric ulcer     Past Surgical History  Procedure Date  . Kidney stone surgery   . Cholecystectomy   . Appendectomy     No family history on file.  History  Substance Use Topics  . Smoking status: Never Smoker   . Smokeless tobacco: Not on file  . Alcohol Use: No      Review of Systems  Constitutional: Negative for fever, chills, diaphoresis and fatigue.  HENT: Negative for ear pain and sinus pressure.   Eyes: Negative for visual disturbance.  Respiratory: Negative for cough.   Cardiovascular: Negative for chest pain.  Gastrointestinal: Negative for nausea, vomiting and abdominal pain.  Genitourinary: Positive for flank pain. Negative for dysuria, urgency and difficulty urinating.  Skin: Negative for color change and rash.  Neurological: Negative for dizziness and headaches.  Psychiatric/Behavioral: Negative for confusion.  All other  systems reviewed and are negative.    Allergies  Banana; Morphine and related; Other; Penicillins; Shrimp; and Latex  Home Medications  No current outpatient prescriptions on file.  BP 111/68  Pulse 74  Temp 98.3 F (36.8 C) (Oral)  Resp 22  SpO2 98%  Physical Exam  Nursing note and vitals reviewed. Constitutional: He is oriented to person, place, and time. He appears well-developed and well-nourished. He appears distressed.  HENT:  Head: Normocephalic and atraumatic.  Eyes: Conjunctivae normal and EOM are normal.  Neck: Normal range of motion. Neck supple.  Cardiovascular: Normal rate, regular rhythm and normal heart sounds.   Pulmonary/Chest: Effort normal.  Abdominal: Soft. Normal appearance and bowel sounds are normal. He exhibits no distension, no ascites and no pulsatile midline mass. There is tenderness (CVA tenderness). There is CVA tenderness.  Musculoskeletal: Normal range of motion.  Neurological: He is alert and oriented to person, place, and time.  Skin: Skin is warm and dry. He is not diaphoretic.  Psychiatric: He has a normal mood and affect. His behavior is normal.    ED Course  Procedures (including critical care time)  Labs Reviewed  BASIC METABOLIC PANEL - Abnormal; Notable for the following:    Glucose, Bld 101 (*)     Calcium 11.6 (*)     GFR calc non Af Amer 61 (*)     GFR calc Af Amer 70 (*)     All other components within normal limits  URINALYSIS, ROUTINE W REFLEX MICROSCOPIC  CBC WITH DIFFERENTIAL   Ct Abdomen Pelvis Wo Contrast  12/13/2011  *RADIOLOGY REPORT*  Clinical Data:  Nephrolithiasis and flank pain  CT ABDOMEN AND PELVIS WITHOUT CONTRAST  Technique:  Multidetector CT imaging of the abdomen and pelvis was performed following the standard protocol without intravenous contrast.  Comparison: 08/28/2010  Findings:  The lung bases are clear.  No pericardial or pleural effusion.  No focal liver abnormality.  Previous cholecystectomy.  No  biliary dilatation.  The pancreas appears normal.  The spleen is unremarkable.  Both adrenal glands are normal.  The right kidney appears normal. There is right-sided hydroureter without evidence for ureterolithiasis.  A stone within the inferior pole of the left kidney measures 0.6 x 0.3 cm.  There is mild left hydroureter.  No stone identified along the course of the ureter.  Within the bladder base there is a 5.8 mm calcific density, image 81.  This appears to be within the proximal portion of the prostatic urethra. The bladder wall appears uniformly thickened measuring up to 9 mm.  No enlarged upper abdominal or pelvic lymph nodes.  No inguinal adenopathy.  There is no ascites within the upper abdomen or the pelvis. Normal appearance of the stomach.  Postop change involving the mid small bowel loops are identified.  The colon appears normal.  Review of the visualized osseous structures is significant for mild thoracic spondylosis.  IMPRESSION:  1.  Calcific density near the bladder base likely represents a stone within the proximal portion of the prostatic urethra. 2.  No evidence for hydronephrosis. 3.  Left renal calculus. 4.  Nonspecific bladder wall thickening may be related to chronic bladder outlet obstruction.   Original Report Authenticated By: Rosealee Albee, M.D.      No diagnosis found.    MDM  Kidney stone  Pt has been diagnosed with a Kidney Stone via CT, incidental finding of bladder thickening discussed with pt as well as recommendation for a prostate check. There is no evidence of significant hydronephrosis, serum creatine WNL, vitals sign stable and the pt does not have irratractable vomiting. Pt will be dc home with pain medications & has been advised to follow up with PCP.          Jaci Carrel, [place] 12/13/11 1204

## 2013-07-05 ENCOUNTER — Other Ambulatory Visit: Payer: Self-pay | Admitting: Gastroenterology

## 2013-07-10 ENCOUNTER — Encounter (HOSPITAL_COMMUNITY): Payer: Self-pay | Admitting: Pharmacy Technician

## 2013-07-13 ENCOUNTER — Encounter (HOSPITAL_COMMUNITY): Payer: Self-pay | Admitting: *Deleted

## 2013-07-19 NOTE — Progress Notes (Signed)
Patient called having a tooth extraction am 07-21-2013 prior to dr hung perocedure, pt given dr hung number to call and make dr hung aware having tooth extraction on am 07-21-2013

## 2013-07-21 ENCOUNTER — Ambulatory Visit (HOSPITAL_COMMUNITY): Payer: BC Managed Care – PPO | Admitting: Anesthesiology

## 2013-07-21 ENCOUNTER — Encounter (HOSPITAL_COMMUNITY)
Admission: RE | Disposition: A | Discharge status: Home / Self Care | Payer: Self-pay | Source: Ambulatory Visit | Attending: Gastroenterology

## 2013-07-21 ENCOUNTER — Ambulatory Visit (HOSPITAL_COMMUNITY)
Admission: RE | Admit: 2013-07-21 | Discharge: 2013-07-21 | Disposition: A | Discharge status: Home / Self Care | Payer: BC Managed Care – PPO | Source: Ambulatory Visit | Attending: Gastroenterology | Admitting: Gastroenterology

## 2013-07-21 ENCOUNTER — Encounter (HOSPITAL_COMMUNITY): Payer: BC Managed Care – PPO | Admitting: Anesthesiology

## 2013-07-21 ENCOUNTER — Encounter (HOSPITAL_COMMUNITY): Payer: Self-pay | Admitting: *Deleted

## 2013-07-21 DIAGNOSIS — Z91040 Latex allergy status: Secondary | ICD-10-CM | POA: Insufficient documentation

## 2013-07-21 DIAGNOSIS — K449 Diaphragmatic hernia without obstruction or gangrene: Secondary | ICD-10-CM | POA: Insufficient documentation

## 2013-07-21 DIAGNOSIS — K315 Obstruction of duodenum: Secondary | ICD-10-CM | POA: Insufficient documentation

## 2013-07-21 DIAGNOSIS — Z885 Allergy status to narcotic agent status: Secondary | ICD-10-CM | POA: Insufficient documentation

## 2013-07-21 DIAGNOSIS — K222 Esophageal obstruction: Secondary | ICD-10-CM | POA: Insufficient documentation

## 2013-07-21 DIAGNOSIS — I10 Essential (primary) hypertension: Secondary | ICD-10-CM | POA: Insufficient documentation

## 2013-07-21 DIAGNOSIS — Z91018 Allergy to other foods: Secondary | ICD-10-CM | POA: Insufficient documentation

## 2013-07-21 DIAGNOSIS — Z880 Allergy status to penicillin: Secondary | ICD-10-CM | POA: Insufficient documentation

## 2013-07-21 DIAGNOSIS — Z91013 Allergy to seafood: Secondary | ICD-10-CM | POA: Insufficient documentation

## 2013-07-21 HISTORY — DX: Essential (primary) hypertension: I10

## 2013-07-21 HISTORY — DX: Personal history of urinary calculi: Z87.442

## 2013-07-21 HISTORY — PX: ESOPHAGOGASTRODUODENOSCOPY (EGD) WITH PROPOFOL: SHX5813

## 2013-07-21 SURGERY — ESOPHAGOGASTRODUODENOSCOPY (EGD) WITH PROPOFOL
Anesthesia: Monitor Anesthesia Care

## 2013-07-21 MED ORDER — MIDAZOLAM HCL 2 MG/2ML IJ SOLN
INTRAMUSCULAR | Status: AC
Start: 2013-07-21 — End: 2013-07-21
  Filled 2013-07-21: qty 2

## 2013-07-21 MED ORDER — KETAMINE HCL 10 MG/ML IJ SOLN
INTRAMUSCULAR | Status: AC
  Filled 2013-07-21: qty 1

## 2013-07-21 MED ORDER — SODIUM CHLORIDE 0.9 % IV SOLN: INTRAVENOUS | Status: DC

## 2013-07-21 MED ORDER — PROPOFOL 10 MG/ML IV BOLUS
INTRAVENOUS | Status: AC
Start: 2013-07-21 — End: 2013-07-21
  Filled 2013-07-21: qty 20

## 2013-07-21 MED ORDER — LACTATED RINGERS IV SOLN
INTRAVENOUS | Status: DC
  Administered 2013-07-21: 13:00:00 via INTRAVENOUS

## 2013-07-21 MED ORDER — PROPOFOL INFUSION 10 MG/ML OPTIME
INTRAVENOUS | Status: DC | PRN
  Administered 2013-07-21: 100 ug/kg/min via INTRAVENOUS

## 2013-07-21 MED ORDER — LIDOCAINE HCL (CARDIAC) 20 MG/ML IV SOLN
INTRAVENOUS | Status: DC | PRN
  Administered 2013-07-21: 100 mg via INTRAVENOUS

## 2013-07-21 MED ORDER — LIDOCAINE HCL (CARDIAC) 20 MG/ML IV SOLN
INTRAVENOUS | Status: AC
  Filled 2013-07-21: qty 5

## 2013-07-21 MED ORDER — KETAMINE HCL 10 MG/ML IJ SOLN
INTRAMUSCULAR | Status: DC | PRN
  Administered 2013-07-21: 20 mg via INTRAVENOUS
  Administered 2013-07-21: 10 mg via INTRAVENOUS

## 2013-07-21 MED ORDER — MIDAZOLAM HCL 5 MG/5ML IJ SOLN
INTRAMUSCULAR | Status: DC | PRN
  Administered 2013-07-21: 2 mg via INTRAVENOUS

## 2013-07-21 SURGICAL SUPPLY — 14 items

## 2013-07-21 NOTE — Discharge Instructions (Signed)
Gastrointestinal Endoscopy  Care After  Refer to this sheet in the next few weeks. These instructions provide you with information on caring for yourself after your procedure. Your caregiver may also give you more specific instructions. Your treatment has been planned according to current medical practices, but problems sometimes occur. Call your caregiver if you have any problems or questions after your procedure.  HOME CARE INSTRUCTIONS  · If you were given medicine to help you relax (sedative), do not drive, operate machinery, or sign important documents for 24 hours.  · Avoid alcohol and hot or warm beverages for the first 24 hours after the procedure.  · Only take over-the-counter or prescription medicines for pain, discomfort, or fever as directed by your caregiver. You may resume taking your normal medicines unless your caregiver tells you otherwise. Ask your caregiver when you may resume taking medicines that may cause bleeding, such as aspirin, clopidogrel, or warfarin.  · You may return to your normal diet and activities on the day after your procedure, or as directed by your caregiver. Walking may help to reduce any bloated feeling in your abdomen.  · Drink enough fluids to keep your urine clear or pale yellow.  · You may gargle with salt water if you have a sore throat.  SEEK IMMEDIATE MEDICAL CARE IF:  · You have severe nausea or vomiting.  · You have severe abdominal pain, abdominal cramps that last longer than 6 hours, or abdominal swelling (distention).  · You have severe shoulder or back pain.  · You have trouble swallowing.  · You have shortness of breath, your breathing is shallow, or you are breathing faster than normal.  · You have a fever or a rapid heartbeat.  · You vomit blood or material that looks like coffee grounds.  · You have bloody, black, or tarry stools.  MAKE SURE YOU:  · Understand these instructions.  · Will watch your condition.  · Will get help right away if you are not doing  well or get worse.  Document Released: 09/24/2003 Document Revised: 08/11/2011 Document Reviewed: 05/12/2011  ExitCare® Patient Information ©2014 [employer].  Esophagogastroduodenoscopy  Care After  Refer to this sheet in the next few weeks. These instructions provide you with information on caring for yourself after your procedure. Your caregiver may also give you more specific instructions. Your treatment has been planned according to current medical practices, but problems sometimes occur. Call your caregiver if you have any problems or questions after your procedure.   HOME CARE INSTRUCTIONS  · Do not eat or drink anything until the numbing medicine (local anesthetic) has worn off and your gag reflex has returned. You will know that the local anesthetic has worn off when you can swallow comfortably.  · Do not drive for 12 hours after the procedure or as directed by your caregiver.  · Only take medicines as directed by your caregiver.  SEEK MEDICAL CARE IF:   · You cannot stop coughing.  · You are not urinating at all or less than usual.  SEEK IMMEDIATE MEDICAL CARE IF:  · You have difficulty swallowing.  · You cannot eat or drink.  · You have worsening throat or chest pain.  · You have dizziness, lightheadedness, or you faint.  · You have nausea or vomiting.  · You have chills.  · You have a fever.  · You have severe abdominal pain.  · You have black, tarry, or bloody stools.  Document Released: 01/27/2012 Document   Reviewed: 01/27/2012  ExitCare® Patient Information ©2014 [employer].

## 2013-07-21 NOTE — Transfer of Care (Signed)
Immediate Anesthesia Transfer of Care Note  Patient: Gregory Sweeney  Procedure(s) Performed: Procedure(s): ESOPHAGOGASTRODUODENOSCOPY (EGD) WITH PROPOFOL (N/A)  Patient Location: PACU and Endoscopy Unit  Anesthesia Type:MAC  Level of Consciousness: awake, alert , oriented and patient cooperative  Airway & Oxygen Therapy: Patient Spontanous Breathing and Patient connected to face mask oxygen  Post-op Assessment: Report given to PACU RN, Post -op Vital signs reviewed and stable and Patient moving all extremities  Post vital signs: Reviewed and stable  Complications: No apparent anesthesia complications

## 2013-07-21 NOTE — Anesthesia Postprocedure Evaluation (Signed)
  Anesthesia Post-op Note  Patient: Gregory Sweeney  Procedure(s) Performed: Procedure(s) (LRB): ESOPHAGOGASTRODUODENOSCOPY (EGD) WITH PROPOFOL (N/A)  Patient Location: PACU  Anesthesia Type: MAC  Level of Consciousness: awake and alert   Airway and Oxygen Therapy: Patient Spontanous Breathing  Post-op Pain: mild  Post-op Assessment: Post-op Vital signs reviewed, Patient's Cardiovascular Status Stable, Respiratory Function Stable, Patent Airway and No signs of Nausea or vomiting  Last Vitals:  Filed Vitals:   07/21/13 1520  BP: 163/99  Pulse: 62  Temp:   Resp: 21    Post-op Vital Signs: stable   Complications: No apparent anesthesia complications

## 2013-07-21 NOTE — Transfer of Care (Signed)
Immediate Anesthesia Transfer of Care Note  Patient: Gregory Sweeney  Procedure(s) Performed: Procedure(s): ESOPHAGOGASTRODUODENOSCOPY (EGD) WITH PROPOFOL (N/A)  Patient Location: PACU and Endoscopy Unit  Anesthesia Type:MAC  Level of Consciousness: awake, alert , oriented and patient cooperative  Airway & Oxygen Therapy: Patient Spontanous Breathing and Patient connected to nasal cannula oxygen  Post-op Assessment: Report given to PACU RN, Post -op Vital signs reviewed and stable and Patient moving all extremities  Post vital signs: Reviewed and stable  Complications: No apparent anesthesia complications

## 2013-07-21 NOTE — Op Note (Signed)
[facility] [address] [place], [address]   OPERATIVE PROCEDURE REPORT  PATIENT: Gregory Sweeney  MR#: [id number] BIRTHDATE: 1960-10-16  GENDER: Male ENDOSCOPIST: Jeani Hawking, MD ASSISTANT:   Jamal Maes, RN and Oletha Blend, technician PROCEDURE DATE: 07/21/2013 PROCEDURE:   EGD with balloon dilation ASA CLASS:   Class III INDICATIONS:Duodenal stricture MEDICATIONS: MAC sedation, administered by CRNA TOPICAL ANESTHETIC:   None  DESCRIPTION OF PROCEDURE:   After the risks benefits and alternatives of the procedure were thoroughly explained, informed consent was obtained.  The Pentax Gastroscope [id number]  endoscope was introduced through the mouth  and advanced to the second portion of the duodenum Without limitations.      The instrument was slowly withdrawn as the mucosa was fully examined.     FINDINGS: In the distal esophagus the benign peptic stricture was identified in the setting of a 3 cm hiatal hernia.  The pediatric endoscope was employed to traverse the distal duodeanl bulb stricture.  During this examination it appeared that the stricture opened up.  The adult EGD scope was then used to deploy the balloon.  The duodenal stricture was dilated first and the balloon was inflated to 15 mm.  It was held in place for 1 minute.  Post dilation the expected mucosal tears were noted and the endoscope was abel to traverse the region more freely.  The distal esophageal stricture was then dilated at 15 mm and held in place for 1 minute. No mucosa tears were noted and the area was widely patent. Retroflexed views revealed no abnormalities.     The scope was then withdrawn from the patient and the procedure terminated.  COMPLICATIONS: There were no complications.  IMPRESSION: 1) Duodenal stricture s/p dilation. 2) Esophageal stricture s/p dilation. 3) 3-4 cm hiatal hernia.  RECOMMENDATIONS: 1) PPI QD. 2) Follow up in 1  month. _______________________________ eSigned:  Jeani Hawking, MD 07/21/2013 2:43 PM    PATIENT NAME:  Gregory Sweeney, Gregory Sweeney MR#: [id number]

## 2013-07-21 NOTE — H&P (Signed)
  Gregory Sweeney HPI: During a work up for abdominal pain he was identified to have a proximal duodenal stricture.  It was benign in appearance.  Additionally he also had a hiatal hernia.  He is here today to undergo a dilation of the stricture.  Past Medical History  Diagnosis Date  . Gastric ulcer   . Hypertension   . History of kidney stones     Past Surgical History  Procedure Laterality Date  . Kidney stone surgery    . Cholecystectomy    . Appendectomy      History reviewed. No pertinent family history.  Social History:  reports that he has never smoked. He does not have any smokeless tobacco history on file. He reports that he does not drink alcohol or use illicit drugs.  Allergies:  Allergies  Allergen Reactions  . Banana Anaphylaxis  . Morphine And Related Hives, Shortness Of Breath, Itching and Nausea And Vomiting    headache  . Other Anaphylaxis    Just pecans  . Penicillins Hives and Shortness Of Breath  . Shrimp [Shellfish Allergy] Anaphylaxis    Just shrimp  . Latex Rash    Medications:  Scheduled:  Continuous: . sodium chloride    . lactated ringers 20 mL/hr at 07/21/13 1324    No results found for this or any previous visit (from the past 24 hour(s)).   No results found.  ROS:  As stated above in the HPI otherwise negative.  Blood pressure 151/89, pulse 49, temperature 98.1 F (36.7 C), temperature source Oral, resp. rate 18, height 6\' 3"  (1.905 m), weight 213 lb (96.616 kg), SpO2 100.00%.    PE: Gen: NAD, Alert and Oriented HEENT:  [place]/AT, EOMI Neck: Supple, no LAD Lungs: CTA Bilaterally CV: RRR without M/G/R ABM: Soft, NTND, +BS Ext: No C/C/E  Assessment/Plan: 1) Duodenal stricture - Benign.  Plan: 1) EGD with dilation.  Theda Belfast 07/21/2013, 1:55 PM

## 2013-07-21 NOTE — Anesthesia Preprocedure Evaluation (Addendum)
Anesthesia Evaluation  Patient identified by MRN, date of birth, ID band Patient awake    Reviewed: Allergy & Precautions, H&P , NPO status , Patient's Chart, lab work & pertinent test results  History of Anesthesia Complications (+) AWARENESS UNDER ANESTHESIA  Airway Mallampati: II TM Distance: >3 FB Neck ROM: Full    Dental no notable dental hx.    Pulmonary neg pulmonary ROS,  breath sounds clear to auscultation  Pulmonary exam normal       Cardiovascular hypertension, Pt. on medications negative cardio ROS  Rhythm:Regular Rate:Normal     Neuro/Psych negative neurological ROS  negative psych ROS   GI/Hepatic negative GI ROS, Neg liver ROS,   Endo/Other  negative endocrine ROS  Renal/GU negative Renal ROS  negative genitourinary   Musculoskeletal negative musculoskeletal ROS (+)   Abdominal   Peds negative pediatric ROS (+)  Hematology negative hematology ROS (+)   Anesthesia Other Findings   Reproductive/Obstetrics negative OB ROS                          Anesthesia Physical Anesthesia Plan  ASA: II  Anesthesia Plan: MAC   Post-op Pain Management:    Induction:   Airway Management Planned: Natural Airway and Nasal Cannula  Additional Equipment:   Intra-op Plan:   Post-operative Plan:   Informed Consent: I have reviewed the patients History and Physical, chart, labs and discussed the procedure including the risks, benefits and alternatives for the proposed anesthesia with the patient or authorized representative who has indicated his/her understanding and acceptance.   Dental advisory given  Plan Discussed with: CRNA  Anesthesia Plan Comments:        Anesthesia Quick Evaluation

## 2013-07-23 ENCOUNTER — Emergency Department (HOSPITAL_COMMUNITY)
Admission: EM | Admit: 2013-07-23 | Discharge: 2013-07-23 | Disposition: A | Discharge status: Home / Self Care | Payer: BC Managed Care – PPO | Attending: Emergency Medicine | Admitting: Emergency Medicine

## 2013-07-23 ENCOUNTER — Encounter (HOSPITAL_COMMUNITY): Payer: Self-pay | Admitting: Emergency Medicine

## 2013-07-23 DIAGNOSIS — Z880 Allergy status to penicillin: Secondary | ICD-10-CM | POA: Insufficient documentation

## 2013-07-23 DIAGNOSIS — Z79899 Other long term (current) drug therapy: Secondary | ICD-10-CM | POA: Insufficient documentation

## 2013-07-23 DIAGNOSIS — Z87442 Personal history of urinary calculi: Secondary | ICD-10-CM | POA: Insufficient documentation

## 2013-07-23 DIAGNOSIS — I10 Essential (primary) hypertension: Secondary | ICD-10-CM | POA: Insufficient documentation

## 2013-07-23 DIAGNOSIS — J029 Acute pharyngitis, unspecified: Secondary | ICD-10-CM

## 2013-07-23 DIAGNOSIS — G8918 Other acute postprocedural pain: Secondary | ICD-10-CM

## 2013-07-23 DIAGNOSIS — Z8711 Personal history of peptic ulcer disease: Secondary | ICD-10-CM | POA: Insufficient documentation

## 2013-07-23 DIAGNOSIS — R110 Nausea: Secondary | ICD-10-CM | POA: Insufficient documentation

## 2013-07-23 MED ORDER — MAGIC MOUTHWASH W/LIDOCAINE: 5.0000 mL | Freq: Three times a day (TID) | ORAL | Status: DC | PRN

## 2013-07-23 NOTE — Discharge Instructions (Signed)
You may find relief with over the counter chloraseptic spray as well as salt water gargles. Be sure to stick to the diet and instructions provided to you by your surgeon.  Keep to a liquid diet such as water, juice, and soups if you are still very sore.  Then progress to soft food such as apple sauce, mash potatoes, and yogurt. If you continue to have a lot of pain or discomfort, call your surgeon for further instructions.  Return to ER if you cannot keep down fluids or have difficulty breathing.

## 2013-07-23 NOTE — ED Notes (Signed)
Pt reports esophagus stretching Friday and now c/o throat pain. Has not contacted MD as he cannot find his paperwork. Has not noticed any bleeding.

## 2013-07-23 NOTE — ED Provider Notes (Signed)
  Medical screening examination/treatment/procedure(s) were performed by non-physician practitioner and as supervising physician I was immediately available for consultation/collaboration.   EKG Interpretation None         Gerhard Munch, MD 07/23/13 [phone]

## 2013-07-23 NOTE — ED Provider Notes (Signed)
CSN: [id number]     Arrival date & time 07/23/13  0813 History   First MD Initiated Contact with Patient 07/23/13 1007     Chief Complaint  Patient presents with  . Sore Throat     (Consider location/radiation/quality/duration/timing/severity/associated sxs/prior Treatment) HPI Pt is a 53yo male with hx of gastric ulcers c/o sore throat x2 days. Pt reports having his esophagus stretched on Friday by Dr. Elnoria Howard, GI, as he was having difficulty swallowing solids.  Pt states he was advised to stay on a liquid diet until Monday, 6/1 but states he has tried to eat some food because he has not eaten since surgery.  Pt states it feels like there is glass in his throat and it hurts to swallow. States he forgot the "throat spray" medication he was given by his Careers adviser. He does not recall name of medication and has not attempted to call his surgeon.  Denies fever, n/v/d. Denies cough or difficulty breathing. Denies bleeding.    Past Medical History  Diagnosis Date  . Gastric ulcer   . Hypertension   . History of kidney stones    Past Surgical History  Procedure Laterality Date  . Kidney stone surgery    . Cholecystectomy    . Appendectomy    . Esophageal dilation     No family history on file. History  Substance Use Topics  . Smoking status: Never Smoker   . Smokeless tobacco: Not on file  . Alcohol Use: No    Review of Systems  Constitutional: Negative for fever and chills.  HENT: Positive for sore throat. Negative for congestion, trouble swallowing and voice change.   Respiratory: Negative for cough and shortness of breath.   Cardiovascular: Negative for chest pain and palpitations.  Gastrointestinal: Positive for nausea. Negative for vomiting, abdominal pain and diarrhea.  All other systems reviewed and are negative.     Allergies  Banana; Morphine and related; Other; Penicillins; Shrimp; and Latex  Home Medications   Prior to Admission medications   Medication Sig Start  Date End Date Taking? Authorizing Provider  Alum & Mag Hydroxide-Simeth (MAGIC MOUTHWASH W/LIDOCAINE) SOLN Take 5 mLs by mouth 3 (three) times daily as needed for mouth pain. 07/23/13   Junius Finner, PA-C  lisinopril (PRINIVIL,ZESTRIL) 10 MG tablet Take 10 mg by mouth every evening.    Historical Provider, MD   BP 152/85  Pulse 77  Temp(Src) 97.5 F (36.4 C) (Oral)  Resp 14  SpO2 100% Physical Exam  Nursing note and vitals reviewed. Constitutional: He appears well-developed and well-nourished.  Pt appears well, non-toxic. NAD.   HENT:  Head: Normocephalic and atraumatic.  Right Ear: Hearing, tympanic membrane, external ear and ear canal normal.  Left Ear: Hearing, tympanic membrane, external ear and ear canal normal.  Nose: Nose normal.  Mouth/Throat: Uvula is midline, oropharynx is clear and moist and mucous membranes are normal. No oropharyngeal exudate, posterior oropharyngeal edema, posterior oropharyngeal erythema or tonsillar abscesses.  Post-nasal drip present. Throat otherwise looks well. No tonsillar erythema, edema or exudates. No bleeding.   Eyes: Conjunctivae are normal. No scleral icterus.  Neck: Normal range of motion. Neck supple. No JVD present. No tracheal deviation present.  Cardiovascular: Normal rate, regular rhythm and normal heart sounds.   Pulmonary/Chest: Effort normal and breath sounds normal. No stridor. No respiratory distress. He has no wheezes. He has no rales. He exhibits no tenderness.  No respiratory distress, able to speak in full sentences w/o difficulty. Lungs: CTAB  Abdominal: Soft. Bowel sounds are normal. He exhibits no distension and no mass. There is no tenderness. There is no rebound and no guarding.  Musculoskeletal: Normal range of motion.  Lymphadenopathy:    He has no cervical adenopathy.  Neurological: He is alert.  Skin: Skin is warm and dry.    ED Course  Procedures (including critical care time) Labs Review Labs Reviewed - No data  to display  Imaging Review No results found.   EKG Interpretation None      MDM   Final diagnoses:  Sore throat  Post-op pain   Pt c/o sore throat after endoscopy 2 days ago. Reports he forgot his pain medication. Pt appears well, non-toxic. Afebrile. No respiratory distress. Able to swallow liquids.  Will give magic mouthwash and advised to use OTC chloraseptic spray to help with sore throat. Advised to call Dr. Elnoria HowardHung tomorrow morning, 6/1, if still having pain and concerns. Do not believe further workup is needed at this time. Return precautions provided. Pt verbalized understanding and agreement with tx plan.     Junius Finnerrin O'Malley, PA-C 07/23/13 1610

## 2013-07-24 ENCOUNTER — Encounter (HOSPITAL_COMMUNITY): Payer: Self-pay | Admitting: Gastroenterology

## 2013-08-23 ENCOUNTER — Other Ambulatory Visit: Payer: Self-pay | Admitting: Family Medicine

## 2013-08-30 ENCOUNTER — Other Ambulatory Visit: Payer: Self-pay | Admitting: Family Medicine

## 2013-08-30 DIAGNOSIS — Z87442 Personal history of urinary calculi: Secondary | ICD-10-CM

## 2013-09-01 ENCOUNTER — Ambulatory Visit
Admission: RE | Admit: 2013-09-01 | Discharge: 2013-09-01 | Disposition: A | Discharge status: Home / Self Care | Payer: BC Managed Care – PPO | Source: Ambulatory Visit | Attending: Family Medicine | Admitting: Family Medicine

## 2013-09-01 DIAGNOSIS — Z87442 Personal history of urinary calculi: Secondary | ICD-10-CM

## 2014-06-24 LAB — HM COLONOSCOPY: HM Colonoscopy: NORMAL

## 2014-10-17 ENCOUNTER — Telehealth: Payer: Self-pay

## 2014-10-17 NOTE — Telephone Encounter (Signed)
Pre visit call completed 

## 2014-10-19 ENCOUNTER — Ambulatory Visit (INDEPENDENT_AMBULATORY_CARE_PROVIDER_SITE_OTHER): Payer: BLUE CROSS/BLUE SHIELD | Admitting: Physician Assistant

## 2014-10-19 ENCOUNTER — Encounter: Payer: Self-pay | Admitting: Physician Assistant

## 2014-10-19 VITALS — BP 106/72 | HR 80 | Temp 98.2°F | Resp 16 | Ht 75.0 in | Wt 204.5 lb

## 2014-10-19 DIAGNOSIS — I10 Essential (primary) hypertension: Secondary | ICD-10-CM | POA: Diagnosis not present

## 2014-10-19 DIAGNOSIS — Z23 Encounter for immunization: Secondary | ICD-10-CM

## 2014-10-19 NOTE — Progress Notes (Signed)
Pre visit review using our clinic review tool, if applicable. No additional management support is needed unless otherwise documented below in the visit note/SLS  

## 2014-10-19 NOTE — Progress Notes (Signed)
Patient presents to clinic today to establish care.  Acute Concerns: No acute concerns today.   Chronic Issues: Hypertension -- Currently on lisinopril over the past 8 months. Is taking medication maybe once weekly. Is checking BP at home with good readings. Patient denies chest pain, palpitations, lightheadedness, dizziness, vision changes or frequent headaches.  BP: 106/72 mmHg   Has been working out daily with significant cardiovascular workout without any SOB. Endorses previous issue with SOB when climbing stairs prior to beginning exercise regimen.  Health Maintenance: Dental -- up-to-date Vision -- up-to-date Immunizations -- Unsure of last tetanus. Will be getting today. Colonoscopy -- Last in 2015. Will obtain records. No abnormal findings per patient.  Past Medical History  Diagnosis Date  . Gastric ulcer   . Hypertension   . History of kidney stones   . GERD (gastroesophageal reflux disease)   . Appendicitis   . Bowel obstruction     Past Surgical History  Procedure Laterality Date  . Kidney stone surgery    . Cholecystectomy    . Appendectomy    . Esophageal dilation    . Esophagogastroduodenoscopy (egd) with propofol N/A 07/21/2013    Procedure: ESOPHAGOGASTRODUODENOSCOPY (EGD) WITH PROPOFOL;  Surgeon: Theda Belfast, MD;  Location: [facility];  Service: Endoscopy;  Laterality: N/A;  . Bowel obstruction      No current outpatient prescriptions on file prior to visit.   No current facility-administered medications on file prior to visit.    Allergies  Allergen Reactions  . Ampicillin Anaphylaxis  . Banana Anaphylaxis  . Latex Shortness Of Breath and Rash  . Morphine And Related Hives, Shortness Of Breath, Itching and Nausea And Vomiting    headache  . Other Anaphylaxis    Just pecans  . Penicillins Hives and Shortness Of Breath  . Shrimp [Shellfish Allergy] Anaphylaxis    Just shrimp    Family History  Problem Relation Age of Onset  .  Healthy Mother   . Healthy Father   . Diabetes Paternal Educational psychologist  . Cancer Neg Hx   . Heart disease Neg Hx   . Healthy Brother     x1  . Healthy Sister     x2    Social History   Social History  . Marital Status: Single    Spouse Name: N/A  . Number of Children: 0  . Years of Education: N/A   Occupational History  . 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 Smoking status: Never Smoker   . Smokeless tobacco: Never Used  . Alcohol Use: No  . Drug Use: No  . Sexual Activity: Not Currently   Other Topics Concern  . Not on file   Social History Narrative   Review of Systems  Constitutional: Negative for fever and weight loss.  HENT: Negative for ear discharge, ear pain, hearing loss and tinnitus.   Eyes: Negative for blurred vision, double vision, photophobia and pain.  Respiratory: Negative for cough and shortness of breath.   Cardiovascular: Negative for chest pain and palpitations.  Gastrointestinal: Negative for heartburn, nausea, vomiting, abdominal pain, diarrhea, constipation, blood in stool and melena.  Genitourinary: Negative for dysuria, urgency, frequency, hematuria and flank pain.  Musculoskeletal: Negative for falls.  Neurological: Negative for dizziness, loss of consciousness and headaches.  Endo/Heme/Allergies: Negative for environmental allergies.  Psychiatric/Behavioral: Negative for depression, suicidal ideas, hallucinations and substance  abuse. The patient is not nervous/anxious and does not have insomnia.     BP 106/72 mmHg  Pulse 80  Temp(Src) 98.2 F (36.8 C) (Oral)  Resp 16  Ht 6\' 3"  (1.905 m)  Wt 204 lb 8 oz (92.761 kg)  BMI 25.56 kg/m2  SpO2 97%  Physical Exam  Constitutional: He is oriented to person, place, and time and well-developed, well-nourished, and in no distress.  HENT:  Head: Normocephalic and atraumatic.  Right Ear: External ear normal.  Left Ear:  External ear normal.  Nose: Nose normal.  Mouth/Throat: Oropharynx is clear and moist. No oropharyngeal exudate.  TM within normal limits bilaterally.  Eyes: Conjunctivae are normal. Pupils are equal, round, and reactive to light.  Neck: Neck supple.  Cardiovascular: Normal rate, regular rhythm, normal heart sounds and intact distal pulses.   Pulmonary/Chest: Effort normal and breath sounds normal. No respiratory distress. He has no wheezes. He has no rales. He exhibits no tenderness.  Neurological: He is alert and oriented to person, place, and time.  Skin: Skin is warm and dry. No rash noted.  Psychiatric: Affect normal.  Vitals reviewed.    Assessment/Plan: Need for prophylactic vaccination with combined diphtheria-tetanus-pertussis (DTP) vaccine TDaP given by nursing staff.  Essential hypertension BP 106/72. Endorses home BP measurements in this range. Was noting lightheadedness, cough and SOB when taking lisinopril. BP today is without medication x 1 week. Patient feeling better and is exercising more. Will discontinue lisinopril altogether as medications are not warranted. Continue DASH diet and exercise. Check BP a few times per week. Will follow-up 3 months. Return sooner if BP begins to creep up. Recommend 81 mg ASA daily.

## 2014-10-19 NOTE — Patient Instructions (Signed)
Stop the lisinopril as it is not needed and causing you side effects as well as making BP too low. Keep active with your exercise regimen and try to eat a well-balanced diet. Watch your salt intake as it raises BP.  Keep checking BP at home. If you notice it climbing, call me.  Otherwise I will see you in 2 months for a follow-up. We will get your old medical records.

## 2014-10-21 DIAGNOSIS — Z23 Encounter for immunization: Secondary | ICD-10-CM | POA: Insufficient documentation

## 2014-10-21 DIAGNOSIS — I10 Essential (primary) hypertension: Secondary | ICD-10-CM | POA: Insufficient documentation

## 2014-10-21 NOTE — Assessment & Plan Note (Signed)
BP 106/72. Endorses home BP measurements in this range. Was noting lightheadedness, cough and SOB when taking lisinopril. BP today is without medication x 1 week. Patient feeling better and is exercising more. Will discontinue lisinopril altogether as medications are not warranted. Continue DASH diet and exercise. Check BP a few times per week. Will follow-up 3 months. Return sooner if BP begins to creep up. Recommend 81 mg ASA daily.

## 2014-10-21 NOTE — Assessment & Plan Note (Signed)
TDaP given by nursing staff. 

## 2014-11-09 ENCOUNTER — Ambulatory Visit: Payer: BLUE CROSS/BLUE SHIELD | Admitting: Physician Assistant

## 2014-11-14 ENCOUNTER — Ambulatory Visit (INDEPENDENT_AMBULATORY_CARE_PROVIDER_SITE_OTHER): Payer: BLUE CROSS/BLUE SHIELD | Admitting: Physician Assistant

## 2014-11-14 ENCOUNTER — Encounter: Payer: Self-pay | Admitting: Physician Assistant

## 2014-11-14 VITALS — BP 136/90 | HR 68 | Temp 98.4°F | Resp 16 | Ht 75.0 in | Wt 208.1 lb

## 2014-11-14 DIAGNOSIS — Z23 Encounter for immunization: Secondary | ICD-10-CM | POA: Diagnosis not present

## 2014-11-14 DIAGNOSIS — I10 Essential (primary) hypertension: Secondary | ICD-10-CM | POA: Diagnosis not present

## 2014-11-14 NOTE — Assessment & Plan Note (Signed)
BP at 136/90 today. Asymptomatic. Will hold off on medication due to hypotension previously. Diet is big factor in current BP measurements. DASH diet discussed and handout given. Patient to schedule CPE.

## 2014-11-14 NOTE — Progress Notes (Signed)
    Patient presents to clinic today for follow-up and BP check. Patient was taken off of his lisinopril at last visit due to low BP noted in clinic and on home BP measurements. Patient also having lightheadedness at the time. Since stopping medication, patient notes resolution in symptoms. Is staying active and well hydrated but patient and wife note poor diet. Patient denies chest pain, palpitations, lightheadedness, dizziness, vision changes or frequent headaches.  Past Medical History  Diagnosis Date  . Gastric ulcer   . Hypertension   . History of kidney stones   . GERD (gastroesophageal reflux disease)   . Appendicitis   . Bowel obstruction     No current outpatient prescriptions on file prior to visit.   No current facility-administered medications on file prior to visit.    Allergies  Allergen Reactions  . Ampicillin Anaphylaxis  . Banana Anaphylaxis  . Latex Shortness Of Breath and Rash  . Morphine And Related Hives, Shortness Of Breath, Itching and Nausea And Vomiting    headache  . Other Anaphylaxis    Just pecans  . Penicillins Hives and Shortness Of Breath  . Shrimp [Shellfish Allergy] Anaphylaxis    Just shrimp    Family History  Problem Relation Age of Onset  . Healthy Mother   . Healthy Father   . Diabetes Paternal Educational psychologist  . Cancer Neg Hx   . Heart disease Neg Hx   . Healthy Brother     x1  . Healthy Sister     x2    Social History   Social History  . Marital Status: Single    Spouse Name: N/A  . Number of Children: 0  . Years of Education: N/A   Occupational History  . 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 Smoking status: Never Smoker   . Smokeless tobacco: Never Used  . Alcohol Use: No  . Drug Use: No  . Sexual Activity: Not Currently   Other Topics Concern  . None   Social History Narrative   Review of Systems - See HPI.  All other ROS are  negative.  BP 136/90 mmHg  Pulse 68  Temp(Src) 98.4 F (36.9 C) (Oral)  Resp 16  Ht  (1.905 m)  Wt 208 lb 2 oz (94.405 kg)  BMI 26.01 kg/m2  SpO2 98%  Physical Exam  Constitutional: He is oriented to person, place, and time and well-developed, well-nourished, and in no distress.  HENT:  Head: Normocephalic and atraumatic.  Eyes: Conjunctivae are normal.  Cardiovascular: Normal rate, regular rhythm, normal heart sounds and intact distal pulses.   Pulmonary/Chest: Effort normal and breath sounds normal. No respiratory distress. He has no wheezes. He has no rales. He exhibits no tenderness.  Neurological: He is alert and oriented to person, place, and time.  Skin: Skin is warm and dry. No rash noted.  Psychiatric: Affect normal.  Vitals reviewed.   No results found for this or any previous visit (from the past 2160 hour(s)).  Assessment/Plan: Essential hypertension BP at 136/90 today. Asymptomatic. Will hold off on medication due to hypotension previously. Diet is big factor in current BP measurements. DASH diet discussed and handout given. Patient to schedule CPE.  Need for prophylactic vaccination and inoculation against influenza Flu shot given by nursing staff

## 2014-11-14 NOTE — Assessment & Plan Note (Signed)
Flu shot given by nursing staff. 

## 2014-11-14 NOTE — Patient Instructions (Signed)
Please stay hydrated and keep up with your exercise. Limit salt intake as from what you are telling me the salt-content of your diet is elevating your BP.  Follow-up with me at your convenience for a complete physical.  DASH Eating Plan DASH stands for "Dietary Approaches to Stop Hypertension." The DASH eating plan is a healthy eating plan that has been shown to reduce high blood pressure (hypertension). Additional health benefits may include reducing the risk of type 2 diabetes mellitus, heart disease, and stroke. The DASH eating plan may also help with weight loss. WHAT DO I NEED TO KNOW ABOUT THE DASH EATING PLAN? For the DASH eating plan, you will follow these general guidelines:  Choose foods with a percent daily value for sodium of less than 5% (as listed on the food label).  Use salt-free seasonings or herbs instead of table salt or sea salt.  Check with your health care provider or pharmacist before using salt substitutes.  Eat lower-sodium products, often labeled as "lower sodium" or "no salt added."  Eat fresh foods.  Eat more vegetables, fruits, and low-fat dairy products.  Choose whole grains. Look for the word "whole" as the first word in the ingredient list.  Choose fish and skinless chicken or [place] more often than red meat. Limit fish, poultry, and meat to 6 oz (170 g) each day.  Limit sweets, desserts, sugars, and sugary drinks.  Choose heart-healthy fats.  Limit cheese to 1 oz (28 g) per day.  Eat more home-cooked food and less restaurant, buffet, and fast food.  Limit fried foods.  Cook foods using methods other than frying.  Limit canned vegetables. If you do use them, rinse them well to decrease the sodium.  When eating at a restaurant, ask that your food be prepared with less salt, or no salt if possible. WHAT FOODS CAN I EAT? Seek help from a dietitian for individual calorie needs. Grains Whole grain or whole wheat bread. Brown rice. Whole grain or  whole wheat pasta. Quinoa, bulgur, and whole grain cereals. Low-sodium cereals. Corn or whole wheat flour tortillas. Whole grain cornbread. Whole grain crackers. Low-sodium crackers. Vegetables Fresh or frozen vegetables (raw, steamed, roasted, or grilled). Low-sodium or reduced-sodium tomato and vegetable juices. Low-sodium or reduced-sodium tomato sauce and paste. Low-sodium or reduced-sodium canned vegetables.  Fruits All fresh, canned (in natural juice), or frozen fruits. Meat and Other Protein Products Ground beef (85% or leaner), grass-fed beef, or beef trimmed of fat. Skinless chicken or [place]. Ground chicken or [place]. Pork trimmed of fat. All fish and seafood. Eggs. Dried beans, peas, or lentils. Unsalted nuts and seeds. Unsalted canned beans. Dairy Low-fat dairy products, such as skim or 1% milk, 2% or reduced-fat cheeses, low-fat ricotta or cottage cheese, or plain low-fat yogurt. Low-sodium or reduced-sodium cheeses. Fats and Oils Tub margarines without trans fats. Light or reduced-fat mayonnaise and salad dressings (reduced sodium). Avocado. Safflower, olive, or canola oils. Natural peanut or almond butter. Other Unsalted popcorn and pretzels. The items listed above may not be a complete list of recommended foods or beverages. Contact your dietitian for more options. WHAT FOODS ARE NOT RECOMMENDED? Grains White bread. White pasta. White rice. Refined cornbread. Bagels and croissants. Crackers that contain trans fat. Vegetables Creamed or fried vegetables. Vegetables in a cheese sauce. Regular canned vegetables. Regular canned tomato sauce and paste. Regular tomato and vegetable juices. Fruits Dried fruits. Canned fruit in light or heavy syrup. Fruit juice. Meat and Other Protein Products Fatty cuts of  meat. Ribs, chicken wings, bacon, sausage, bologna, salami, chitterlings, fatback, hot dogs, bratwurst, and packaged luncheon meats. Salted nuts and seeds. Canned beans with  salt. Dairy Whole or 2% milk, cream, half-and-half, and cream cheese. Whole-fat or sweetened yogurt. Full-fat cheeses or blue cheese. Nondairy creamers and whipped toppings. Processed cheese, cheese spreads, or cheese curds. Condiments Onion and garlic salt, seasoned salt, table salt, and sea salt. Canned and packaged gravies. Worcestershire sauce. Tartar sauce. Barbecue sauce. Teriyaki sauce. Soy sauce, including reduced sodium. Steak sauce. Fish sauce. Oyster sauce. Cocktail sauce. Horseradish. Ketchup and mustard. Meat flavorings and tenderizers. Bouillon cubes. Hot sauce. Tabasco sauce. Marinades. Taco seasonings. Relishes. Fats and Oils Butter, stick margarine, lard, shortening, ghee, and bacon fat. Coconut, palm kernel, or palm oils. Regular salad dressings. Other Pickles and olives. Salted popcorn and pretzels. The items listed above may not be a complete list of foods and beverages to avoid. Contact your dietitian for more information. WHERE CAN I FIND MORE INFORMATION? National Heart, [employer], and [employer] Institute: travelstabloid.com Document Released: 01/29/2011 Document Revised: 06/26/2013 Document Reviewed: 12/14/2012 [facility] Patient Information 2015 [place], [place]. This information is not intended to replace advice given to you by your health care provider. Make sure you discuss any questions you have with your health care provider.

## 2014-11-27 ENCOUNTER — Encounter: Payer: Self-pay | Admitting: Physician Assistant

## 2015-06-11 ENCOUNTER — Telehealth: Payer: Self-pay | Admitting: *Deleted

## 2015-06-11 ENCOUNTER — Encounter: Payer: Self-pay | Admitting: *Deleted

## 2015-06-11 NOTE — Telephone Encounter (Signed)
Pre-Visit Call completed with patient and chart updated.   Pre-Visit Info documented in Specialty Comments under [employer].    

## 2015-06-12 ENCOUNTER — Ambulatory Visit (INDEPENDENT_AMBULATORY_CARE_PROVIDER_SITE_OTHER): Payer: BLUE CROSS/BLUE SHIELD | Admitting: Physician Assistant

## 2015-06-12 ENCOUNTER — Encounter: Payer: Self-pay | Admitting: Physician Assistant

## 2015-06-12 VITALS — BP 110/68 | HR 74 | Temp 97.9°F | Resp 16 | Ht 75.0 in | Wt 199.1 lb

## 2015-06-12 DIAGNOSIS — Z125 Encounter for screening for malignant neoplasm of prostate: Secondary | ICD-10-CM | POA: Diagnosis not present

## 2015-06-12 DIAGNOSIS — I10 Essential (primary) hypertension: Secondary | ICD-10-CM | POA: Diagnosis not present

## 2015-06-12 DIAGNOSIS — Z0000 Encounter for general adult medical examination without abnormal findings: Secondary | ICD-10-CM | POA: Diagnosis not present

## 2015-06-12 LAB — LIPID PANEL
CHOL/HDL RATIO: 4
Cholesterol: 178 mg/dL (ref 0–200)
HDL: 40.9 mg/dL (ref >39.00)
LDL Cholesterol: 121 mg/dL — ABNORMAL HIGH (ref 0–99)
NONHDL: 137.22
Triglycerides: 83 mg/dL (ref 0.0–149.0)
VLDL: 16.6 mg/dL (ref 0.0–40.0)

## 2015-06-12 LAB — COMPREHENSIVE METABOLIC PANEL
ALK PHOS: 56 U/L (ref 39–117)
ALT: 19 U/L (ref 0–53)
AST: 21 U/L (ref 0–37)
Albumin: 4.4 g/dL (ref 3.5–5.2)
BILIRUBIN TOTAL: 0.6 mg/dL (ref 0.2–1.2)
BUN: 17 mg/dL (ref 6–23)
CO2: 29 meq/L (ref 19–32)
Calcium: 11.1 mg/dL — ABNORMAL HIGH (ref 8.4–10.5)
Chloride: 105 mEq/L (ref 96–112)
Creatinine, Ser: 1.31 mg/dL (ref 0.40–1.50)
GFR: 72.48 mL/min (ref >60.00)
GLUCOSE: 110 mg/dL — AB (ref 70–99)
POTASSIUM: 4.4 meq/L (ref 3.5–5.1)
SODIUM: 142 meq/L (ref 135–145)
TOTAL PROTEIN: 7.6 g/dL (ref 6.0–8.3)

## 2015-06-12 LAB — URINALYSIS, ROUTINE W REFLEX MICROSCOPIC
BILIRUBIN URINE: NEGATIVE
Hgb urine dipstick: NEGATIVE
Ketones, ur: NEGATIVE
Leukocytes, UA: NEGATIVE
Nitrite: NEGATIVE
RBC / HPF: NONE SEEN (ref 0–?)
Specific Gravity, Urine: 1.025 (ref 1.000–1.030)
TOTAL PROTEIN, URINE-UPE24: 30 — AB
UROBILINOGEN UA: 0.2 (ref 0.0–1.0)
Urine Glucose: NEGATIVE
WBC, UA: NONE SEEN (ref 0–?)
pH: 6 (ref 5.0–8.0)

## 2015-06-12 LAB — PSA: PSA: 0.51 ng/mL (ref 0.10–4.00)

## 2015-06-12 LAB — CBC
HCT: 45.2 % (ref 39.0–52.0)
Hemoglobin: 15.1 g/dL (ref 13.0–17.0)
MCHC: 33.3 g/dL (ref 30.0–36.0)
MCV: 90.2 fl (ref 78.0–100.0)
Platelets: 233 10*3/uL (ref 150.0–400.0)
RBC: 5.01 Mil/uL (ref 4.22–5.81)
RDW: 14.3 % (ref 11.5–15.5)
WBC: 7 10*3/uL (ref 4.0–10.5)

## 2015-06-12 LAB — TSH: TSH: 1.47 u[IU]/mL (ref 0.35–4.50)

## 2015-06-12 LAB — HEMOGLOBIN A1C: Hgb A1c MFr Bld: 6 % (ref 4.6–6.5)

## 2015-06-12 NOTE — Assessment & Plan Note (Signed)
BP 110/68 today. Asymptomatic. Well-controlled with diet and exercise. Body mass index is 24.89 kg/(m^2).Marland Kitchen. Will continue diet and exercise. No changes today.

## 2015-06-12 NOTE — Patient Instructions (Signed)
Please go to the lab for blood work.   Our office will call you with your results unless you have chosen to receive results via MyChart.  If your blood work is normal we will follow-up each year for physicals and as scheduled for chronic medical problems.  If anything is abnormal we will treat accordingly and get you in for a follow-up.  Stay well hydrated and increase fiber to prevent straining with bowel movements as this causes hemorrhoids.  Preventive Care for Adults, Male A healthy lifestyle and preventive care can promote health and wellness. Preventive health guidelines for men include the following key practices:  A routine yearly physical is a good way to check with your health care provider about your health and preventative screening. It is a chance to share any concerns and updates on your health and to receive a thorough exam.  Visit your dentist for a routine exam and preventative care every 6 months. Brush your teeth twice a day and floss once a day. Good oral hygiene prevents tooth decay and gum disease.  The frequency of eye exams is based on your age, health, family medical history, use of contact lenses, and other factors. Follow your health care provider's recommendations for frequency of eye exams.  Eat a healthy diet. Foods such as vegetables, fruits, whole grains, low-fat dairy products, and lean protein foods contain the nutrients you need without too many calories. Decrease your intake of foods high in solid fats, added sugars, and salt. Eat the right amount of calories for you.Get information about a proper diet from your health care provider, if necessary.  Regular physical exercise is one of the most important things you can do for your health. Most adults should get at least 150 minutes of moderate-intensity exercise (any activity that increases your heart rate and causes you to sweat) each week. In addition, most adults need muscle-strengthening exercises on 2 or  more days a week.  Maintain a healthy weight. The body mass index (BMI) is a screening tool to identify possible weight problems. It provides an estimate of body fat based on height and weight. Your health care provider can find your BMI and can help you achieve or maintain a healthy weight.For adults 20 years and older:  A BMI below 18.5 is considered underweight.  A BMI of 18.5 to 24.9 is normal.  A BMI of 25 to 29.9 is considered overweight.  A BMI of 30 and above is considered obese.  Maintain normal blood lipids and cholesterol levels by exercising and minimizing your intake of saturated fat. Eat a balanced diet with plenty of fruit and vegetables. Blood tests for lipids and cholesterol should begin at age 31 and be repeated every 5 years. If your lipid or cholesterol levels are high, you are over 50, or you are at high risk for heart disease, you may need your cholesterol levels checked more frequently.Ongoing high lipid and cholesterol levels should be treated with medicines if diet and exercise are not working.  If you smoke, find out from your health care provider how to quit. If you do not use tobacco, do not start.  Lung cancer screening is recommended for adults aged 55-80 years who are at high risk for developing lung cancer because of a history of smoking. A yearly low-dose CT scan of the lungs is recommended for people who have at least a 30-pack-year history of smoking and are a current smoker or have quit within the past 15 years. A  pack year of smoking is smoking an average of 1 pack of cigarettes a day for 1 year (for example: 1 pack a day for 30 years or 2 packs a day for 15 years). Yearly screening should continue until the smoker has stopped smoking for at least 15 years. Yearly screening should be stopped for people who develop a health problem that would prevent them from having lung cancer treatment.  If you choose to drink alcohol, do not have more than 2 drinks per day.  One drink is considered to be 12 ounces (355 mL) of beer, 5 ounces (148 mL) of wine, or 1.5 ounces (44 mL) of liquor.  Avoid use of street drugs. Do not share needles with anyone. Ask for help if you need support or instructions about stopping the use of drugs.  High blood pressure causes heart disease and increases the risk of stroke. Your blood pressure should be checked at least every 1-2 years. Ongoing high blood pressure should be treated with medicines, if weight loss and exercise are not effective.  If you are 65-68 years old, ask your health care provider if you should take aspirin to prevent heart disease.  Diabetes screening is done by taking a blood sample to check your blood glucose level after you have not eaten for a certain period of time (fasting). If you are not overweight and you do not have risk factors for diabetes, you should be screened once every 3 years starting at age 36. If you are overweight or obese and you are 10-53 years of age, you should be screened for diabetes every year as part of your cardiovascular risk assessment.  Colorectal cancer can be detected and often prevented. Most routine colorectal cancer screening begins at the age of 27 and continues through age 27. However, your health care provider may recommend screening at an earlier age if you have risk factors for colon cancer. On a yearly basis, your health care provider may provide home test kits to check for hidden blood in the stool. Use of a small camera at the end of a tube to directly examine the colon (sigmoidoscopy or colonoscopy) can detect the earliest forms of colorectal cancer. Talk to your health care provider about this at age 61, when routine screening begins. Direct exam of the colon should be repeated every 5-10 years through age 84, unless early forms of precancerous polyps or small growths are found.  People who are at an increased risk for hepatitis B should be screened for this virus. You are  considered at high risk for hepatitis B if:  You were born in a country where hepatitis B occurs often. Talk with your health care provider about which countries are considered high risk.  Your parents were born in a high-risk country and you have not received a shot to protect against hepatitis B (hepatitis B vaccine).  You have HIV or AIDS.  You use needles to inject street drugs.  You live with, or have sex with, someone who has hepatitis B.  You are a man who has sex with other men (MSM).  You get hemodialysis treatment.  You take certain medicines for conditions such as cancer, organ transplantation, and autoimmune conditions.  Hepatitis C blood testing is recommended for all people born from [birth year] through 1965 and any individual with known risks for hepatitis C.  Practice safe sex. Use condoms and avoid high-risk sexual practices to reduce the spread of sexually transmitted infections (STIs). STIs include gonorrhea,  chlamydia, syphilis, trichomonas, herpes, HPV, and human immunodeficiency virus (HIV). Herpes, HIV, and HPV are viral illnesses that have no cure. They can result in disability, cancer, and death.  If you are a man who has sex with other men, you should be screened at least once per year for:  HIV.  Urethral, rectal, and pharyngeal infection of gonorrhea, chlamydia, or both.  If you are at risk of being infected with HIV, it is recommended that you take a prescription medicine daily to prevent HIV infection. This is called preexposure prophylaxis (PrEP). You are considered at risk if:  You are a man who has sex with other men (MSM) and have other risk factors.  You are a heterosexual man, are sexually active, and are at increased risk for HIV infection.  You take drugs by injection.  You are sexually active with a partner who has HIV.  Talk with your health care provider about whether you are at high risk of being infected with HIV. If you choose to begin PrEP,  you should first be tested for HIV. You should then be tested every 3 months for as long as you are taking PrEP.  A one-time screening for abdominal aortic aneurysm (AAA) and surgical repair of large AAAs by ultrasound are recommended for men ages 25 to 69 years who are current or former smokers.  Healthy men should no longer receive prostate-specific antigen (PSA) blood tests as part of routine cancer screening. Talk with your health care provider about prostate cancer screening.  Testicular cancer screening is not recommended for adult males who have no symptoms. Screening includes self-exam, a health care provider exam, and other screening tests. Consult with your health care provider about any symptoms you have or any concerns you have about testicular cancer.  Use sunscreen. Apply sunscreen liberally and repeatedly throughout the day. You should seek shade when your shadow is shorter than you. Protect yourself by wearing long sleeves, pants, a wide-brimmed hat, and sunglasses year round, whenever you are outdoors.  Once a month, do a whole-body skin exam, using a mirror to look at the skin on your back. Tell your health care provider about new moles, moles that have irregular borders, moles that are larger than a pencil eraser, or moles that have changed in shape or color.  Stay current with required vaccines (immunizations).  Influenza vaccine. All adults should be immunized every year.  Tetanus, diphtheria, and acellular pertussis (Td, Tdap) vaccine. An adult who has not previously received Tdap or who does not know his vaccine status should receive 1 dose of Tdap. This initial dose should be followed by tetanus and diphtheria toxoids (Td) booster doses every 10 years. Adults with an unknown or incomplete history of completing a 3-dose immunization series with Td-containing vaccines should begin or complete a primary immunization series including a Tdap dose. Adults should receive a Td booster  every 10 years.  Varicella vaccine. An adult without evidence of immunity to varicella should receive 2 doses or a second dose if he has previously received 1 dose.  Human papillomavirus (HPV) vaccine. Males aged 11-21 years who have not received the vaccine previously should receive the 3-dose series. Males aged 22-26 years may be immunized. Immunization is recommended through the age of 24 years for any male who has sex with males and did not get any or all doses earlier. Immunization is recommended for any person with an immunocompromised condition through the age of 75 years if he did not get  any or all doses earlier. During the 3-dose series, the second dose should be obtained 4-8 weeks after the first dose. The third dose should be obtained 24 weeks after the first dose and 16 weeks after the second dose.  Zoster vaccine. One dose is recommended for adults aged 56 years or older unless certain conditions are present.  Measles, mumps, and rubella (MMR) vaccine. Adults born before [birth year] generally are considered immune to measles and mumps. Adults born in [birth year] or later should have 1 or more doses of MMR vaccine unless there is a contraindication to the vaccine or there is laboratory evidence of immunity to each of the three diseases. A routine second dose of MMR vaccine should be obtained at least 28 days after the first dose for students attending postsecondary schools, health care workers, or international travelers. People who received inactivated measles vaccine or an unknown type of measles vaccine during 1963-1967 should receive 2 doses of MMR vaccine. People who received inactivated mumps vaccine or an unknown type of mumps vaccine before 1979 and are at high risk for mumps infection should consider immunization with 2 doses of MMR vaccine. Unvaccinated health care workers born before [birth year] who lack laboratory evidence of measles, mumps, or rubella immunity or laboratory confirmation of disease  should consider measles and mumps immunization with 2 doses of MMR vaccine or rubella immunization with 1 dose of MMR vaccine.  Pneumococcal 13-valent conjugate (PCV13) vaccine. When indicated, a person who is uncertain of his immunization history and has no record of immunization should receive the PCV13 vaccine. All adults 43 years of age and older should receive this vaccine. An adult aged 65 years or older who has certain medical conditions and has not been previously immunized should receive 1 dose of PCV13 vaccine. This PCV13 should be followed with a dose of pneumococcal polysaccharide (PPSV23) vaccine. Adults who are at high risk for pneumococcal disease should obtain the PPSV23 vaccine at least 8 weeks after the dose of PCV13 vaccine. Adults older than 57 years of age who have normal immune system function should obtain the PPSV23 vaccine dose at least 1 year after the dose of PCV13 vaccine.  Pneumococcal polysaccharide (PPSV23) vaccine. When PCV13 is also indicated, PCV13 should be obtained first. All adults aged 68 years and older should be immunized. An adult younger than age 69 years who has certain medical conditions should be immunized. Any person who resides in a nursing home or long-term care facility should be immunized. An adult smoker should be immunized. People with an immunocompromised condition and certain other conditions should receive both PCV13 and PPSV23 vaccines. People with human immunodeficiency virus (HIV) infection should be immunized as soon as possible after diagnosis. Immunization during chemotherapy or radiation therapy should be avoided. Routine use of PPSV23 vaccine is not recommended for American Indians, [place] Natives, or people younger than 65 years unless there are medical conditions that require PPSV23 vaccine. When indicated, people who have unknown immunization and have no record of immunization should receive PPSV23 vaccine. One-time revaccination 5 years after the  first dose of PPSV23 is recommended for people aged 19-64 years who have chronic kidney failure, nephrotic syndrome, asplenia, or immunocompromised conditions. People who received 1-2 doses of PPSV23 before age 2 years should receive another dose of PPSV23 vaccine at age 75 years or later if at least 5 years have passed since the previous dose. Doses of PPSV23 are not needed for people immunized with PPSV23 at or after age 65 years.  Meningococcal vaccine. Adults with asplenia or persistent complement component deficiencies should receive 2 doses of quadrivalent meningococcal conjugate (MenACWY-D) vaccine. The doses should be obtained at least 2 months apart. Microbiologists working with certain meningococcal bacteria, [place] recruits, people at risk during an outbreak, and people who travel to or live in countries with a high rate of meningitis should be immunized. A first-year college student up through age 32 years who is living in a residence hall should receive a dose if he did not receive a dose on or after his 16th birthday. Adults who have certain high-risk conditions should receive one or more doses of vaccine.  Hepatitis A vaccine. Adults who wish to be protected from this disease, have chronic liver disease, work with hepatitis A-infected animals, work in hepatitis A research labs, or travel to or work in countries with a high rate of hepatitis A should be immunized. Adults who were previously unvaccinated and who anticipate close contact with an international adoptee during the first 60 days after arrival in the [place] from a country with a high rate of hepatitis A should be immunized.  Hepatitis B vaccine. Adults should be immunized if they wish to be protected from this disease, are under age 69 years and have diabetes, have chronic liver disease, have had more than one sex partner in the past 6 months, may be exposed to blood or other infectious body fluids, are household contacts or  sex partners of hepatitis B positive people, are clients or workers in certain care facilities, or travel to or work in countries with a high rate of hepatitis B.  Haemophilus influenzae type b (Hib) vaccine. A previously unvaccinated person with asplenia or sickle cell disease or having a scheduled splenectomy should receive 1 dose of Hib vaccine. Regardless of previous immunization, a recipient of a hematopoietic stem cell transplant should receive a 3-dose series 6-12 months after his successful transplant. Hib vaccine is not recommended for adults with HIV infection. Preventive Service / Frequency Ages 81 to 65  Blood pressure check.** / Every 3-5 years.  Lipid and cholesterol check.** / Every 5 years beginning at age 11.  Hepatitis C blood test.** / For any individual with known risks for hepatitis C.  Skin self-exam. / Monthly.  Influenza vaccine. / Every year.  Tetanus, diphtheria, and acellular pertussis (Tdap, Td) vaccine.** / Consult your health care provider. 1 dose of Td every 10 years.  Varicella vaccine.** / Consult your health care provider.  HPV vaccine. / 3 doses over 6 months, if 32 or younger.  Measles, mumps, rubella (MMR) vaccine.** / You need at least 1 dose of MMR if you were born in 1957 or later. You may also need a second dose.  Pneumococcal 13-valent conjugate (PCV13) vaccine.** / Consult your health care provider.  Pneumococcal polysaccharide (PPSV23) vaccine.** / 1 to 2 doses if you smoke cigarettes or if you have certain conditions.  Meningococcal vaccine.** / 1 dose if you are age 49 to 66 years and a Market researcher living in a residence hall, or have one of several medical conditions. You may also need additional booster doses.  Hepatitis A vaccine.** / Consult your health care provider.  Hepatitis B vaccine.** / Consult your health care provider.  Haemophilus influenzae type b (Hib) vaccine.** / Consult your health care provider. Ages 42  to 9  Blood pressure check.** / Every year.  Lipid and cholesterol check.** / Every 5 years beginning at age 36.  Lung cancer screening. /  Every year if you are aged 23-80 years and have a 30-pack-year history of smoking and currently smoke or have quit within the past 15 years. Yearly screening is stopped once you have quit smoking for at least 15 years or develop a health problem that would prevent you from having lung cancer treatment.  Fecal occult blood test (FOBT) of stool. / Every year beginning at age 33 and continuing until age 39. You may not have to do this test if you get a colonoscopy every 10 years.  Flexible sigmoidoscopy** or colonoscopy.** / Every 5 years for a flexible sigmoidoscopy or every 10 years for a colonoscopy beginning at age 2 and continuing until age 24.  Hepatitis C blood test.** / For all people born from [birth year] through 1965 and any individual with known risks for hepatitis C.  Skin self-exam. / Monthly.  Influenza vaccine. / Every year.  Tetanus, diphtheria, and acellular pertussis (Tdap/Td) vaccine.** / Consult your health care provider. 1 dose of Td every 10 years.  Varicella vaccine.** / Consult your health care provider.  Zoster vaccine.** / 1 dose for adults aged 63 years or older.  Measles, mumps, rubella (MMR) vaccine.** / You need at least 1 dose of MMR if you were born in 1959 or later. You may also need a second dose.  Pneumococcal 13-valent conjugate (PCV13) vaccine.** / Consult your health care provider.  Pneumococcal polysaccharide (PPSV23) vaccine.** / 1 to 2 doses if you smoke cigarettes or if you have certain conditions.  Meningococcal vaccine.** / Consult your health care provider.  Hepatitis A vaccine.** / Consult your health care provider.  Hepatitis B vaccine.** / Consult your health care provider.  Haemophilus influenzae type b (Hib) vaccine.** / Consult your health care provider. Ages 64 and over  Blood pressure check.** /  Every year.  Lipid and cholesterol check.**/ Every 5 years beginning at age 30.  Lung cancer screening. / Every year if you are aged 48-80 years and have a 30-pack-year history of smoking and currently smoke or have quit within the past 15 years. Yearly screening is stopped once you have quit smoking for at least 15 years or develop a health problem that would prevent you from having lung cancer treatment.  Fecal occult blood test (FOBT) of stool. / Every year beginning at age 39 and continuing until age 75. You may not have to do this test if you get a colonoscopy every 10 years.  Flexible sigmoidoscopy** or colonoscopy.** / Every 5 years for a flexible sigmoidoscopy or every 10 years for a colonoscopy beginning at age 35 and continuing until age 46.  Hepatitis C blood test.** / For all people born from [birth year] through 1967 and any individual with known risks for hepatitis C.  Abdominal aortic aneurysm (AAA) screening.** / A one-time screening for ages 68 to 41 years who are current or former smokers.  Skin self-exam. / Monthly.  Influenza vaccine. / Every year.  Tetanus, diphtheria, and acellular pertussis (Tdap/Td) vaccine.** / 1 dose of Td every 10 years.  Varicella vaccine.** / Consult your health care provider.  Zoster vaccine.** / 1 dose for adults aged 10 years or older.  Pneumococcal 13-valent conjugate (PCV13) vaccine.** / 1 dose for all adults aged 62 years and older.  Pneumococcal polysaccharide (PPSV23) vaccine.** / 1 dose for all adults aged 40 years and older.  Meningococcal vaccine.** / Consult your health care provider.  Hepatitis A vaccine.** / Consult your health care provider.  Hepatitis B vaccine.** / Consult  your health care provider.  Haemophilus influenzae type b (Hib) vaccine.** / Consult your health care provider. **Family history and personal history of risk and conditions may change your health care provider's recommendations.   This information is not  intended to replace advice given to you by your health care provider. Make sure you discuss any questions you have with your health care provider.   Document Released: 04/07/2001 Document Revised: 03/02/2014 Document Reviewed: 07/07/2010 Elsevier Interactive Patient Education [employer].

## 2015-06-12 NOTE — Assessment & Plan Note (Signed)
The natural history of prostate cancer and ongoing controversy regarding screening and potential treatment outcomes of prostate cancer has been discussed with the patient. The meaning of a false positive PSA and a false negative PSA has been discussed. He indicates understanding of the limitations of this screening test and wishes  to proceed with screening PSA testing and DRE. DRE within normal limits. PSA ordered

## 2015-06-12 NOTE — Progress Notes (Signed)
Pre visit review using our clinic review tool, if applicable. No additional management support is needed unless otherwise documented below in the visit note/SLS  

## 2015-06-12 NOTE — Assessment & Plan Note (Signed)
Depression screen negative. Health Maintenance reviewed -- Immunizations and colonoscopy up-to-date. Preventive schedule discussed and handout given in AVS. Will obtain fasting labs today.  

## 2015-06-12 NOTE — Progress Notes (Signed)
Patient presents to clinic today for annual exam.  Patient is fasting for labs.   Chronic Issues: Hypertension -- Is currently controlled with diet and exercise. Patient denies chest pain, palpitations, lightheadedness, dizziness, vision changes or frequent headaches. Previously on ACEI with severe cough.   BP Readings from Last 3 Encounters:  06/12/15 110/68  11/14/14 136/90  10/19/14 106/72   GERD -- History of GERD with ulcer and stricture. Followed by GI. Is s/p recent esophageal dilation. Denies any current symptoms after his procedure.   Health Maintenance: Declines HIV and Hep C Screening.  Immunizations -- up-to-date Colonoscopy -- up-to-date  Past Medical History  Diagnosis Date  . Gastric ulcer   . Hypertension   . History of kidney stones   . GERD (gastroesophageal reflux disease)   . Appendicitis   . Bowel obstruction [facility](HCC)     Past Surgical History  Procedure Laterality Date  . Kidney stone surgery    . Cholecystectomy    . Appendectomy    . Esophageal dilation    . Esophagogastroduodenoscopy (egd) with propofol N/A 07/21/2013    Procedure: ESOPHAGOGASTRODUODENOSCOPY (EGD) WITH PROPOFOL;  Surgeon: Theda BelfastPatrick D Hung, MD;  Location: [facility];  Service: Endoscopy;  Laterality: N/A;  . Bowel obstruction      No current outpatient prescriptions on file prior to visit.   No current facility-administered medications on file prior to visit.    Allergies  Allergen Reactions  . Ampicillin Anaphylaxis  . Banana Anaphylaxis  . Latex Hives, Shortness Of Breath, Itching and Rash  . Morphine And Related Hives, Shortness Of Breath, Itching and Nausea And Vomiting    headache  . Other Anaphylaxis    Just pecans  . Penicillins Hives and Shortness Of Breath  . Shrimp [Shellfish Allergy] Anaphylaxis    Just shrimp    Family History  Problem Relation Age of Onset  . Healthy Mother   . Healthy Father   . Diabetes Paternal Educational psychologistAunt     Great Aunt  . Cancer Neg  Hx   . Heart disease Neg Hx   . Healthy Brother     x1  . Healthy Sister     x2    Social History   Social History  . Marital Status: Single    Spouse Name: N/A  . Number of Children: 0  . Years of Education: N/A   Occupational History  . 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 Smoking status: Never Smoker   . Smokeless tobacco: Never Used  . Alcohol Use: No  . Drug Use: No  . Sexual Activity: Not Currently   Other Topics Concern  . Not on file   Social History Narrative   Review of Systems  Constitutional: Negative for fever and weight loss.  HENT: Negative for ear discharge, ear pain, hearing loss and tinnitus.   Eyes: Negative for blurred vision, double vision, photophobia and pain.  Respiratory: Negative for cough and shortness of breath.   Cardiovascular: Negative for chest pain and palpitations.  Gastrointestinal: Negative for heartburn, nausea, vomiting, abdominal pain, diarrhea, constipation, blood in stool and melena.  Genitourinary: Negative for dysuria, urgency, frequency, hematuria and flank pain.       Nocturia x 0-1.  Musculoskeletal: Negative for falls.  Neurological: Negative for dizziness, loss of consciousness and headaches.  Endo/Heme/Allergies: Negative for environmental allergies.  Psychiatric/Behavioral: Negative for depression, suicidal ideas, hallucinations and substance  abuse. The patient is not nervous/anxious and does not have insomnia.    BP 110/68 mmHg  Pulse 74  Temp(Src) 97.9 F (36.6 C) (Oral)  Resp 16  Ht  (1.905 m)  Wt 199 lb 2 oz (90.323 kg)  BMI 24.89 kg/m2  SpO2 98%  Physical Exam  Constitutional: He is oriented to person, place, and time and well-developed, well-nourished, and in no distress.  HENT:  Head: Normocephalic and atraumatic.  Right Ear: External ear normal.  Left Ear: External ear normal.  Nose: Nose normal.  Mouth/Throat: Oropharynx is clear  and moist. No oropharyngeal exudate.  Eyes: Conjunctivae and EOM are normal. Pupils are equal, round, and reactive to light.  Neck: Neck supple. No thyromegaly present.  Cardiovascular: Normal rate, regular rhythm, normal heart sounds and intact distal pulses.   Pulmonary/Chest: Effort normal and breath sounds normal. No respiratory distress. He has no wheezes. He has no rales. He exhibits no tenderness.  Abdominal: Soft. Bowel sounds are normal. He exhibits no distension and no mass. There is no tenderness. There is no rebound and no guarding.  Genitourinary: Testes/scrotum normal. Rectal exam shows no fissure. Guaiac negative stool. Prostate is not enlarged.  Lymphadenopathy:    He has no cervical adenopathy.  Neurological: He is alert and oriented to person, place, and time.  Skin: Skin is warm and dry. No rash noted.  Psychiatric: Affect normal.  Vitals reviewed.  No results found for this or any previous visit (from the past 2160 hour(s)).  Assessment/Plan: Essential hypertension BP 110/68 today. Asymptomatic. Well-controlled with diet and exercise. Body mass index is 24.89 kg/(m^2).Marland Kitchen Will continue diet and exercise. No changes today.   Visit for preventive health examination Depression screen negative. Health Maintenance reviewed -- Immunizations and colonoscopy up-to-date. Preventive schedule discussed and handout given in AVS. Will obtain fasting labs today.   Prostate cancer screening The natural history of prostate cancer and ongoing controversy regarding screening and potential treatment outcomes of prostate cancer has been discussed with the patient. The meaning of a false positive PSA and a false negative PSA has been discussed. He indicates understanding of the limitations of this screening test and wishes  to proceed with screening PSA testing and DRE. DRE within normal limits. PSA ordered

## 2015-06-17 ENCOUNTER — Encounter: Payer: Self-pay | Admitting: *Deleted

## 2015-06-17 ENCOUNTER — Telehealth: Payer: Self-pay | Admitting: *Deleted

## 2015-06-17 DIAGNOSIS — R8290 Unspecified abnormal findings in urine: Secondary | ICD-10-CM

## 2015-06-17 NOTE — Telephone Encounter (Signed)
-----   Message from Waldon MerlWilliam C Martin, PA-C sent at 06/16/2015 10:02 PM EDT ----- Lab results are in.  (1) CBC -- Blood count looks good. (2) CMP -- Kidney and Liver function look good. Calcium was slightly high and it seems he has a history of this. Would like to check his parathyroid hormone level to make sure they are not causing calcium resorption. Would like him to come in to lab for a PTH level. (3) Lipid Panel -- Cholesterol overall looks great. LDL (bad cholesterol) is borderline high at 121. Increase exercise and limit foods high in saturated fats and cholesterol. (4) PSA and TSH -- Prostate level looks good. Thyroid function is within normal limits.  (5) UA -- Mild protein noted in urine. Do not think it is anything worrisome but should recheck. Will need a repeat UA.  (6) A!C -- Is at 6.0 which places him in pre-diabetic range. Recommend increased exercise and for him to be mindful or high carb meals.   Please schedule patient for lab in 1 week for repeat UA and for a PTH level.

## 2015-06-18 NOTE — Telephone Encounter (Signed)
Left message with patients wife to have us to return call.

## 2015-06-19 NOTE — Telephone Encounter (Signed)
Called and spoke with the pt on (Mon-06/17/15) and informed him of recent lab results and note.  Pt verbalized understanding and agreed.  Future labs ordered and sent.  Pt was scheduled a lab appt for repeat UA and PTH w/ Calcium on (Tues.06/25/15 @ 7:15am).  Pt requested a copy of results and note.  Results and note was placed up front to be picked up.//AB/CMA

## 2015-06-25 ENCOUNTER — Other Ambulatory Visit (INDEPENDENT_AMBULATORY_CARE_PROVIDER_SITE_OTHER): Payer: BLUE CROSS/BLUE SHIELD

## 2015-06-25 DIAGNOSIS — R8290 Unspecified abnormal findings in urine: Secondary | ICD-10-CM | POA: Diagnosis not present

## 2015-06-25 DIAGNOSIS — R7989 Other specified abnormal findings of blood chemistry: Secondary | ICD-10-CM

## 2015-06-25 DIAGNOSIS — E349 Endocrine disorder, unspecified: Secondary | ICD-10-CM

## 2015-06-25 LAB — URINALYSIS, ROUTINE W REFLEX MICROSCOPIC
BILIRUBIN URINE: NEGATIVE
Hgb urine dipstick: NEGATIVE
KETONES UR: NEGATIVE
Leukocytes, UA: NEGATIVE
Nitrite: NEGATIVE
PH: 6.5 (ref 5.0–8.0)
RBC / HPF: NONE SEEN (ref 0–?)
Specific Gravity, Urine: 1.01 (ref 1.000–1.030)
TOTAL PROTEIN, URINE-UPE24: NEGATIVE
UROBILINOGEN UA: 0.2 (ref 0.0–1.0)
Urine Glucose: NEGATIVE
WBC UA: NONE SEEN (ref 0–?)

## 2015-06-26 LAB — PTH, INTACT AND CALCIUM
Calcium: 10.1 mg/dL (ref 8.4–10.5)
PTH: 114 pg/mL — ABNORMAL HIGH (ref 14–64)

## 2015-06-26 NOTE — Addendum Note (Signed)
Addended by: Neldon LabellaMABE, Rosalia Mcavoy S on: 06/26/2015 04:28 PM   Modules accepted: Orders

## 2015-08-02 ENCOUNTER — Encounter: Payer: Self-pay | Admitting: Endocrinology

## 2015-08-02 ENCOUNTER — Ambulatory Visit (INDEPENDENT_AMBULATORY_CARE_PROVIDER_SITE_OTHER): Payer: BLUE CROSS/BLUE SHIELD | Admitting: Endocrinology

## 2015-08-02 VITALS — BP 149/93 | HR 57 | Temp 98.2°F | Ht 75.0 in | Wt 203.0 lb

## 2015-08-02 DIAGNOSIS — E213 Hyperparathyroidism, unspecified: Secondary | ICD-10-CM | POA: Diagnosis not present

## 2015-08-02 DIAGNOSIS — N209 Urinary calculus, unspecified: Secondary | ICD-10-CM

## 2015-08-02 LAB — VITAMIN D 25 HYDROXY (VIT D DEFICIENCY, FRACTURES): VITD: 19.12 ng/mL — ABNORMAL LOW (ref 30.00–100.00)

## 2015-08-02 LAB — PHOSPHORUS: Phosphorus: 2.5 mg/dL (ref 2.3–4.6)

## 2015-08-02 NOTE — Patient Instructions (Signed)
blood tests are requested for you today.  We'll let you know about the results.   Please see a surgery specialist.  you will receive a phone call, about a day and time for an appointment.         

## 2015-08-02 NOTE — Progress Notes (Signed)
Subjective:    Patient ID: Gregory Sweeney, male    DOB: 12/08/1960, 55 y.o.   MRN: [id number]  HPI Pt was noted to have slight hypercalcemia in 2011 (it was normal in) 2010. He has never had osteoporosis, thyroid probs, parathyroid probs, sarcoidosis, cancer, pancreatitis, depression, or bony fracture.  Pt denies taking antacids, Li++, or HCTZ. He has h/o urolithiasis.  He takes a multivitamin and vitamin-D, 5000 units per day. He has slight pain at the lower back, but no assoc hematuria Past Medical History  Diagnosis Date  . Gastric ulcer   . Hypertension   . History of kidney stones   . GERD (gastroesophageal reflux disease)   . Appendicitis   . Bowel obstruction [facility](HCC)     Past Surgical History  Procedure Laterality Date  . Kidney stone surgery    . Cholecystectomy    . Appendectomy    . Esophageal dilation    . Esophagogastroduodenoscopy (egd) with propofol N/A 07/21/2013    Procedure: ESOPHAGOGASTRODUODENOSCOPY (EGD) WITH PROPOFOL;  Surgeon: Theda BelfastPatrick D Hung, MD;  Location: [facility];  Service: Endoscopy;  Laterality: N/A;  . Bowel obstruction      Social History   Social History  . Marital Status: Single    Spouse Name: N/A  . Number of Children: 0  . Years of Education: N/A   Occupational History  . 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 Smoking status: Never Smoker   . Smokeless tobacco: Never Used  . Alcohol Use: No  . Drug Use: No  . Sexual Activity: Not Currently   Other Topics Concern  . Not on file   Social History Narrative    No current outpatient prescriptions on file prior to visit.   No current facility-administered medications on file prior to visit.    Allergies  Allergen Reactions  . Ampicillin Anaphylaxis  . Banana Anaphylaxis  . Latex Hives, Shortness Of Breath, Itching and Rash  . Morphine And Related Hives, Shortness Of Breath, Itching and Nausea And Vomiting   headache  . Other Anaphylaxis    Just pecans  . Penicillins Hives and Shortness Of Breath  . Shrimp [Shellfish Allergy] Anaphylaxis    Just shrimp    Family History  Problem Relation Age of Onset  . Healthy Mother   . Healthy Father   . Diabetes Paternal Educational psychologistAunt     Great Aunt  . Cancer Neg Hx   . Heart disease Neg Hx   . Healthy Brother     x1  . Healthy Sister     x2  . Hypercalcemia Neg Hx     BP 149/93 mmHg  Pulse 57  Temp(Src) 98.2 F (36.8 C) (Oral)  Ht 6\' 3"  (1.905 m)  Wt 203 lb (92.08 kg)  BMI 25.37 kg/m2  SpO2 99%  Review of Systems denies weight loss, gynecomastia, memory loss, numbness, arthralgias, abdominal pain, urinary frequency, hypoglycemia, skin rash, visual loss, sob, diarrhea, rhinorrhea, easy bruising, and depression.     Objective:   Physical Exam VS: see vs page GEN: no distress HEAD: head: no deformity eyes: no periorbital swelling, no proptosis external nose and ears are normal mouth: no lesion seen NECK: supple, thyroid is not enlarged CHEST WALL: no deformity.  No kyphosis LUNGS: clear to auscultation CV: reg rate and rhythm, no murmur ABD: abdomen is soft, nontender.  no hepatosplenomegaly.  not distended.  no hernia  MUSCULOSKELETAL: muscle bulk and strength are grossly normal.  no obvious joint swelling.  gait is normal and steady EXTEMITIES: no deformity.  no edema PULSES: dorsalis pedis intact bilat.  no carotid bruit NEURO:  cn 2-12 grossly intact.   readily moves all 4's.  sensation is intact to touch on all 4's SKIN:  Normal texture and temperature.  No rash or suspicious lesion is visible.   NODES:  None palpable at the neck PSYCH: alert, well-oriented.  Does not appear anxious nor depressed.   Lab Results  Component Value Date   PTH 114* 06/25/2015   CALCIUM 10.1 06/25/2015   CAION 1.30 05/24/2010   PHOS 2.5 08/02/2015   [place] (2015): 7 mm nonobstructing stone left lower pole  I have reviewed outside records, and  summarized: Pt was seen in ER in 2013 with urolithiasis.   At most recent wellness visit, he was noted to have hypercalcemia.       Assessment & Plan:  Hyperparathyroidism, new.  Urolithiasis.  In this setting, he should have parathyroid surgery.   Low-back pain: pt feels this is due to urolithiasis.    Patient is advised the following: Patient Instructions  blood tests are requested for you today.  We'll let you know about the results. Please see a surgery specialist.  you will receive a phone call, about a day and time for an appointment.    Romero Belling, MD

## 2015-08-02 NOTE — Progress Notes (Signed)
Pre visit review using our clinic tool,if applicable. No additional management support is needed unless otherwise documented below in the visit note.  

## 2015-08-05 LAB — PTH, INTACT AND CALCIUM
Calcium: 10.4 mg/dL (ref 8.4–10.5)
PTH: 109 pg/mL — ABNORMAL HIGH (ref 14–64)

## 2015-08-09 ENCOUNTER — Other Ambulatory Visit (HOSPITAL_COMMUNITY): Payer: Self-pay | Admitting: General Surgery

## 2015-08-12 ENCOUNTER — Other Ambulatory Visit (HOSPITAL_COMMUNITY): Payer: Self-pay | Admitting: General Surgery

## 2015-08-12 DIAGNOSIS — E210 Primary hyperparathyroidism: Secondary | ICD-10-CM

## 2015-08-22 ENCOUNTER — Encounter (HOSPITAL_COMMUNITY)
Admission: RE | Admit: 2015-08-22 | Discharge: 2015-08-22 | Disposition: A | Discharge status: Home / Self Care | Payer: BLUE CROSS/BLUE SHIELD | Source: Ambulatory Visit | Attending: General Surgery | Admitting: General Surgery

## 2015-08-22 DIAGNOSIS — E210 Primary hyperparathyroidism: Secondary | ICD-10-CM | POA: Diagnosis present

## 2015-08-22 MED ORDER — TECHNETIUM TC 99M SESTAMIBI GENERIC - CARDIOLITE
25.3000 | Freq: Once | INTRAVENOUS | Status: AC | PRN
  Administered 2015-08-22: 25 via INTRAVENOUS

## 2015-08-30 ENCOUNTER — Ambulatory Visit: Payer: BLUE CROSS/BLUE SHIELD | Admitting: Physician Assistant

## 2015-08-30 DIAGNOSIS — Z0289 Encounter for other administrative examinations: Secondary | ICD-10-CM

## 2015-09-02 ENCOUNTER — Other Ambulatory Visit: Payer: Self-pay | Admitting: General Surgery

## 2015-09-02 DIAGNOSIS — E210 Primary hyperparathyroidism: Secondary | ICD-10-CM

## 2015-09-03 ENCOUNTER — Encounter: Payer: Self-pay | Admitting: Physician Assistant

## 2015-09-09 ENCOUNTER — Other Ambulatory Visit: Payer: BLUE CROSS/BLUE SHIELD

## 2015-09-13 ENCOUNTER — Other Ambulatory Visit: Payer: BLUE CROSS/BLUE SHIELD

## 2015-09-13 ENCOUNTER — Ambulatory Visit
Admission: RE | Admit: 2015-09-13 | Discharge: 2015-09-13 | Disposition: A | Discharge status: Home / Self Care | Payer: BLUE CROSS/BLUE SHIELD | Source: Ambulatory Visit | Attending: General Surgery | Admitting: General Surgery

## 2015-09-13 DIAGNOSIS — E210 Primary hyperparathyroidism: Secondary | ICD-10-CM

## 2015-09-13 MED ORDER — IOPAMIDOL (ISOVUE-300) INJECTION 61%
75.0000 mL | Freq: Once | INTRAVENOUS | Status: AC | PRN
  Administered 2015-09-13: 75 mL via INTRAVENOUS

## 2015-12-06 ENCOUNTER — Ambulatory Visit: Payer: Self-pay | Admitting: Surgery

## 2015-12-18 ENCOUNTER — Encounter (HOSPITAL_COMMUNITY): Payer: Self-pay | Admitting: *Deleted

## 2015-12-25 NOTE — Patient Instructions (Signed)
Gregory ReadingJames A Sweeney  12/25/2015   Your procedure is scheduled on:12/30/2015     [facility]  [facility] 3rd floor to  Short Stay Center at   1045 AM.  Call this number if you have problems the morning of surgery [phone]   Remember: ONLY 1 PERSON MAY GO WITH YOU TO SHORT STAY TO GET  READY MORNING OF YOUR SURGERY.  Do not eat food or drink liquids :After Midnight.     Take these medicines the morning of surgery with A SIP OF WATER:                                 You may not have any metal on your body including hair pins and              piercings  Do not wear jewelry,  lotions, powders or perfumes, deodorant                          Men may shave face and neck.   Do not bring valuables to the hospital. [facility] IS NOT             RESPONSIBLE   FOR VALUABLES.  Contacts, dentures or bridgework may not be worn into surgery.  Leave suitcase in the car. After surgery it may be brought to your room.       Special Instructions: coughing and deep breathing exercises, leg exercises               Please read over the following fact sheets you were given: _____________________________________________________________________             [facility] - Pre[facility] Before surgery, you can play an important role.  Because skin is not sterile, your skin needs to be as free of germs as possible.  You can reduce the number of germs on your skin by washing with CHG (chlorahexidine gluconate) soap before surgery.  CHG is an antiseptic cleaner which kills germs and bonds with the skin to continue killing germs even after washing. Please DO NOT use if you have an allergy to CHG or antibacterial soaps.  If your skin becomes reddened/irritated stop using the CHG and inform your nurse when you arrive at Short Stay. Do not shave (including legs and underarms) for at least 48 hours prior to the first CHG shower.  You may shave your  face/neck. Please follow these instructions carefully:  1.  Shower with CHG Soap the night before surgery and the  morning of Surgery.  2.  If you choose to wash your hair, wash your hair first as usual with your  normal  shampoo.  3.  After you shampoo, rinse your hair and body thoroughly to remove the  shampoo.                           4.  Use CHG as you would any other liquid soap.  You can apply chg directly  to the skin and wash                       Gently with a scrungie or clean washcloth.  5.  Apply the CHG  Soap to your body ONLY FROM THE NECK DOWN.   Do not use on face/ open                           Wound or open sores. Avoid contact with eyes, ears mouth and genitals (private parts).                       Wash face,  Genitals (private parts) with your normal soap.             6.  Wash thoroughly, paying special attention to the area where your surgery  will be performed.  7.  Thoroughly rinse your body with warm water from the neck down.  8.  DO NOT shower/wash with your normal soap after using and rinsing off  the CHG Soap.                9.  Pat yourself dry with a clean towel.            10.  Wear clean pajamas.            11.  Place clean sheets on your bed the night of your first shower and do not  sleep with pets. Day of Surgery : Do not apply any lotions/deodorants the morning of surgery.  Please wear clean clothes to the hospital/surgery center.  FAILURE TO FOLLOW THESE INSTRUCTIONS MAY RESULT IN THE CANCELLATION OF YOUR SURGERY PATIENT SIGNATURE_________________________________  NURSE SIGNATURE__________________________________  ________________________________________________________________________

## 2015-12-27 ENCOUNTER — Ambulatory Visit (HOSPITAL_COMMUNITY)
Admission: RE | Admit: 2015-12-27 | Discharge: 2015-12-27 | Disposition: A | Discharge status: Home / Self Care | Payer: BLUE CROSS/BLUE SHIELD | Source: Ambulatory Visit | Attending: Anesthesiology | Admitting: Anesthesiology

## 2015-12-27 ENCOUNTER — Encounter (HOSPITAL_COMMUNITY): Payer: Self-pay

## 2015-12-27 ENCOUNTER — Encounter (HOSPITAL_COMMUNITY)
Admission: RE | Admit: 2015-12-27 | Discharge: 2015-12-27 | Disposition: A | Discharge status: Home / Self Care | Payer: BLUE CROSS/BLUE SHIELD | Source: Ambulatory Visit | Attending: Surgery | Admitting: Surgery

## 2015-12-27 DIAGNOSIS — Z01810 Encounter for preprocedural cardiovascular examination: Secondary | ICD-10-CM | POA: Insufficient documentation

## 2015-12-27 DIAGNOSIS — I2510 Atherosclerotic heart disease of native coronary artery without angina pectoris: Secondary | ICD-10-CM | POA: Diagnosis not present

## 2015-12-27 DIAGNOSIS — R9431 Abnormal electrocardiogram [ECG] [EKG]: Secondary | ICD-10-CM | POA: Insufficient documentation

## 2015-12-27 DIAGNOSIS — Z01812 Encounter for preprocedural laboratory examination: Secondary | ICD-10-CM | POA: Insufficient documentation

## 2015-12-27 DIAGNOSIS — Z01818 Encounter for other preprocedural examination: Secondary | ICD-10-CM | POA: Insufficient documentation

## 2015-12-27 LAB — BASIC METABOLIC PANEL
Anion gap: 4 — ABNORMAL LOW (ref 5–15)
BUN: 12 mg/dL (ref 6–20)
CALCIUM: 10.1 mg/dL (ref 8.9–10.3)
CO2: 26 mmol/L (ref 22–32)
CREATININE: 1.16 mg/dL (ref 0.61–1.24)
Chloride: 108 mmol/L (ref 101–111)
GFR calc non Af Amer: >60 mL/min (ref >60)
Glucose, Bld: 130 mg/dL — ABNORMAL HIGH (ref 65–99)
Potassium: 4.7 mmol/L (ref 3.5–5.1)
SODIUM: 138 mmol/L (ref 135–145)

## 2015-12-27 LAB — CBC
HCT: 43.7 % (ref 39.0–52.0)
Hemoglobin: 14.8 g/dL (ref 13.0–17.0)
MCH: 30.3 pg (ref 26.0–34.0)
MCHC: 33.9 g/dL (ref 30.0–36.0)
MCV: 89.4 fL (ref 78.0–100.0)
PLATELETS: 212 10*3/uL (ref 150–400)
RBC: 4.89 MIL/uL (ref 4.22–5.81)
RDW: 12.9 % (ref 11.5–15.5)
WBC: 6.6 10*3/uL (ref 4.0–10.5)

## 2015-12-29 ENCOUNTER — Encounter (HOSPITAL_COMMUNITY): Payer: Self-pay | Admitting: Surgery

## 2015-12-29 NOTE — H&P (Signed)
General Surgery [facility]  Gregory Sweeney DOB: 11/14/1960 Single / Language: Len[place] Ponds[place] / Race: Black or African American Male  History of Present Illness  Patient words: parathyroidism.  The patient is a 55 year old male who presents with a parathyroid neoplasm.  Patient was previously evaluated by my partner, Dr. Jaclynn GuarneriBen Hoxworth. Patient was originally referred by Dr. Romero BellingSean Sweeney for primary hyperparathyroidism. Patient has laboratory values showing an elevated calcium level of 11.6 and an elevated intact PTH level of 114. Nuclear medicine parathyroid scan performed June 2017 failed to localize a parathyroid adenoma in the thyroid bed. There was an area of intense uptake in the left submandibular region. Subsequent CT scan of the neck was performed on September 13, 2015. This identified a 17 mm nodule suspicious for parathyroid adenoma in the right inferior position near the thoracic inlet. It also demonstrated a 12 mm oval nodule corresponding to the lesion seen on sestamibi scan in the left upper neck. Both of these were felt to be suspicious for parathyroid adenoma. Patient is now referred to discuss surgical exploration and removal of these 2 candidates for parathyroid adenomas.   Allergies Ampicillin *PENICILLINS* Banana (Diagnostic) *DIAGNOSTIC PRODUCTS* Latex Exam Gloves *MEDICAL DEVICES AND SUPPLIES* Morphine Sulfate (PF) *ANALGESICS - OPIOID* Shellfish Penicillin G Benzathine & Proc *PENICILLINS*  Medication History No Current Medications Medications Reconciled  Vitals Weight: 200.8 lb Height: 75in Body Surface Area: 2.2 m Body Mass Index: 25.1 kg/m  Temp.: 55F(Oral)  Pulse: 70 (Regular)  BP: 120/78 (Sitting, Left Arm, Standard)   Physical Exam The physical exam findings are as follows: Note:General - appears comfortable, no distress; not diaphorectic  HEENT - normocephalic; sclerae clear, gaze conjugate; mucous membranes  moist, dentition good; voice normal  Neck - symmetric on extension; no palpable anterior or posterior cervical adenopathy; no palpable masses in the thyroid bed  Chest - clear bilaterally without rhonchi, rales, or wheeze  Cor - regular rhythm with normal rate; no significant murmur  Ext - non-tender without significant edema or lymphedema  Neuro - grossly intact; no tremor   Assessment & Plan  PRIMARY HYPERPARATHYROIDISM (E21.0)  Patient presents with primary hyperparathyroidism. We reviewed his nuclear medicine study and his CT scan as well as his laboratory studies.  It appears the patient may have 2 parathyroid adenomas, a larger lesion in the right inferior position, and a smaller lesion and a left superior position. Both are slightly ectopic.  I have recommended exploring the neck through a traditional thyroid incision. We would plan to identify all 4 parathyroid glands if possible. We would plan a right inferior parathyroidectomy with frozen section. We would also explore the left upper neck and remove the suspicious lesion for frozen section biopsy as well.  I discussed the procedure with the patient and his family. I discussed the location of the surgical incision. I recommended overnight observation following the procedure. We discussed risk and benefits including risk of injury to laryngeal nerves. We discussed the fact that other parathyroid glands could be ectopic. Patient understands and wishes to proceed with surgery in the near future.  The risks and benefits of the procedure have been discussed at length with the patient. The patient understands the proposed procedure, potential alternative treatments, and the course of recovery to be expected. All of the patient's questions have been answered at this time. The patient wishes to proceed with surgery.  Velora Hecklerodd M. Sama Arauz, MD, FACS General & Endocrine Surgery [facility] Office: [phone]

## 2015-12-30 ENCOUNTER — Ambulatory Visit (HOSPITAL_COMMUNITY): Payer: BLUE CROSS/BLUE SHIELD | Admitting: Anesthesiology

## 2015-12-30 ENCOUNTER — Observation Stay (HOSPITAL_COMMUNITY)
Admission: RE | Admit: 2015-12-30 | Discharge: 2015-12-31 | Disposition: A | Discharge status: Home / Self Care | Payer: BLUE CROSS/BLUE SHIELD | Source: Ambulatory Visit | Attending: Surgery | Admitting: Surgery

## 2015-12-30 ENCOUNTER — Encounter (HOSPITAL_COMMUNITY): Payer: Self-pay | Admitting: *Deleted

## 2015-12-30 ENCOUNTER — Encounter (HOSPITAL_COMMUNITY)
Admission: RE | Disposition: A | Discharge status: Home / Self Care | Payer: Self-pay | Source: Ambulatory Visit | Attending: Surgery

## 2015-12-30 DIAGNOSIS — Z91040 Latex allergy status: Secondary | ICD-10-CM | POA: Diagnosis not present

## 2015-12-30 DIAGNOSIS — Z91018 Allergy to other foods: Secondary | ICD-10-CM | POA: Diagnosis not present

## 2015-12-30 DIAGNOSIS — Z887 Allergy status to serum and vaccine status: Secondary | ICD-10-CM | POA: Diagnosis not present

## 2015-12-30 DIAGNOSIS — E210 Primary hyperparathyroidism: Secondary | ICD-10-CM | POA: Diagnosis not present

## 2015-12-30 DIAGNOSIS — E213 Hyperparathyroidism, unspecified: Secondary | ICD-10-CM | POA: Diagnosis present

## 2015-12-30 DIAGNOSIS — Z880 Allergy status to penicillin: Secondary | ICD-10-CM | POA: Insufficient documentation

## 2015-12-30 HISTORY — PX: PARATHYROIDECTOMY: SHX19

## 2015-12-30 SURGERY — PARATHYROIDECTOMY
Anesthesia: General | Site: Neck

## 2015-12-30 MED ORDER — ONDANSETRON HCL 4 MG/2ML IJ SOLN
INTRAMUSCULAR | Status: DC | PRN
  Administered 2015-12-30: 4 mg via INTRAVENOUS

## 2015-12-30 MED ORDER — HYDROCODONE-ACETAMINOPHEN 5-325 MG PO TABS
1.0000 | ORAL_TABLET | ORAL | Status: DC | PRN
  Administered 2015-12-30: 2 via ORAL
  Filled 2015-12-30: qty 2

## 2015-12-30 MED ORDER — PROPOFOL 10 MG/ML IV BOLUS
INTRAVENOUS | Status: DC | PRN
  Administered 2015-12-30: 200 mg via INTRAVENOUS

## 2015-12-30 MED ORDER — MEPERIDINE HCL 50 MG/ML IJ SOLN: 6.2500 mg | INTRAMUSCULAR | Status: DC | PRN

## 2015-12-30 MED ORDER — FENTANYL CITRATE (PF) 100 MCG/2ML IJ SOLN
INTRAMUSCULAR | Status: AC
  Filled 2015-12-30: qty 2

## 2015-12-30 MED ORDER — ONDANSETRON 4 MG PO TBDP: 4.0000 mg | ORAL_TABLET | Freq: Four times a day (QID) | ORAL | Status: DC | PRN

## 2015-12-30 MED ORDER — DEXAMETHASONE SODIUM PHOSPHATE 10 MG/ML IJ SOLN
INTRAMUSCULAR | Status: AC
  Filled 2015-12-30: qty 1

## 2015-12-30 MED ORDER — LABETALOL HCL 5 MG/ML IV SOLN
5.0000 mg | INTRAVENOUS | Status: AC | PRN
  Administered 2015-12-30 (×4): 5 mg via INTRAVENOUS

## 2015-12-30 MED ORDER — ACETAMINOPHEN 325 MG PO TABS: 650.0000 mg | ORAL_TABLET | Freq: Four times a day (QID) | ORAL | Status: DC | PRN

## 2015-12-30 MED ORDER — HYDROMORPHONE HCL 1 MG/ML IJ SOLN
INTRAMUSCULAR | Status: AC
  Administered 2015-12-30: 0.25 mg via INTRAVENOUS
  Filled 2015-12-30: qty 1

## 2015-12-30 MED ORDER — MIDAZOLAM HCL 2 MG/2ML IJ SOLN
INTRAMUSCULAR | Status: AC
  Filled 2015-12-30: qty 2

## 2015-12-30 MED ORDER — KCL IN DEXTROSE-NACL 20-5-0.45 MEQ/L-%-% IV SOLN
INTRAVENOUS | Status: DC
  Administered 2015-12-31: 01:00:00 via INTRAVENOUS
  Filled 2015-12-30: qty 1000

## 2015-12-30 MED ORDER — MIDAZOLAM HCL 2 MG/2ML IJ SOLN: 0.5000 mg | Freq: Once | INTRAMUSCULAR | Status: DC | PRN

## 2015-12-30 MED ORDER — PROPOFOL 10 MG/ML IV BOLUS
INTRAVENOUS | Status: AC
  Filled 2015-12-30: qty 20

## 2015-12-30 MED ORDER — HYDROMORPHONE HCL 1 MG/ML IJ SOLN
1.0000 mg | INTRAMUSCULAR | Status: DC | PRN
  Administered 2015-12-30: 1 mg via INTRAVENOUS
  Filled 2015-12-30: qty 1

## 2015-12-30 MED ORDER — ACETAMINOPHEN 650 MG RE SUPP: 650.0000 mg | Freq: Four times a day (QID) | RECTAL | Status: DC | PRN

## 2015-12-30 MED ORDER — DEXAMETHASONE SODIUM PHOSPHATE 10 MG/ML IJ SOLN
INTRAMUSCULAR | Status: DC | PRN
  Administered 2015-12-30: 10 mg via INTRAVENOUS

## 2015-12-30 MED ORDER — CIPROFLOXACIN IN D5W 400 MG/200ML IV SOLN
400.0000 mg | INTRAVENOUS | Status: AC
  Administered 2015-12-30: 400 mg via INTRAVENOUS

## 2015-12-30 MED ORDER — MIDAZOLAM HCL 5 MG/5ML IJ SOLN
INTRAMUSCULAR | Status: DC | PRN
  Administered 2015-12-30: 2 mg via INTRAVENOUS

## 2015-12-30 MED ORDER — LIDOCAINE 2% (20 MG/ML) 5 ML SYRINGE
INTRAMUSCULAR | Status: AC
  Filled 2015-12-30: qty 5

## 2015-12-30 MED ORDER — SUCCINYLCHOLINE CHLORIDE 20 MG/ML IJ SOLN
INTRAMUSCULAR | Status: AC
  Filled 2015-12-30: qty 1

## 2015-12-30 MED ORDER — SUGAMMADEX SODIUM 200 MG/2ML IV SOLN
INTRAVENOUS | Status: DC | PRN
  Administered 2015-12-30: 200 mg via INTRAVENOUS

## 2015-12-30 MED ORDER — ACETAMINOPHEN 10 MG/ML IV SOLN
INTRAVENOUS | Status: AC
  Filled 2015-12-30: qty 100

## 2015-12-30 MED ORDER — DIPHENHYDRAMINE HCL 50 MG/ML IJ SOLN
25.0000 mg | Freq: Once | INTRAMUSCULAR | Status: AC
  Administered 2015-12-30: 25 mg via INTRAVENOUS
  Filled 2015-12-30: qty 1

## 2015-12-30 MED ORDER — BUPIVACAINE HCL 0.25 % IJ SOLN
INTRAMUSCULAR | Status: DC | PRN
  Administered 2015-12-30: 10 mL

## 2015-12-30 MED ORDER — PROMETHAZINE HCL 25 MG/ML IJ SOLN: 6.2500 mg | INTRAMUSCULAR | Status: DC | PRN

## 2015-12-30 MED ORDER — LIDOCAINE 2% (20 MG/ML) 5 ML SYRINGE
INTRAMUSCULAR | Status: DC | PRN
  Administered 2015-12-30: 100 mg via INTRAVENOUS

## 2015-12-30 MED ORDER — CHLORHEXIDINE GLUCONATE CLOTH 2 % EX PADS: 6.0000 | MEDICATED_PAD | Freq: Once | CUTANEOUS | Status: DC

## 2015-12-30 MED ORDER — ACETAMINOPHEN 10 MG/ML IV SOLN
1000.0000 mg | Freq: Four times a day (QID) | INTRAVENOUS | Status: DC | PRN
  Administered 2015-12-30: 1000 mg via INTRAVENOUS
  Filled 2015-12-30: qty 100

## 2015-12-30 MED ORDER — DIPHENHYDRAMINE HCL 25 MG PO CAPS: 25.0000 mg | ORAL_CAPSULE | ORAL | Status: DC | PRN

## 2015-12-30 MED ORDER — CIPROFLOXACIN IN D5W 400 MG/200ML IV SOLN
INTRAVENOUS | Status: AC
  Filled 2015-12-30: qty 200

## 2015-12-30 MED ORDER — ONDANSETRON HCL 4 MG/2ML IJ SOLN
INTRAMUSCULAR | Status: AC
  Filled 2015-12-30: qty 2

## 2015-12-30 MED ORDER — LACTATED RINGERS IV SOLN
INTRAVENOUS | Status: DC
  Administered 2015-12-30: 1000 mL via INTRAVENOUS
  Administered 2015-12-30: 15:00:00 via INTRAVENOUS

## 2015-12-30 MED ORDER — ONDANSETRON HCL 4 MG/2ML IJ SOLN: 4.0000 mg | Freq: Four times a day (QID) | INTRAMUSCULAR | Status: DC | PRN

## 2015-12-30 MED ORDER — BUPIVACAINE HCL (PF) 0.25 % IJ SOLN
INTRAMUSCULAR | Status: AC
Start: 2015-12-30 — End: 2015-12-30
  Filled 2015-12-30: qty 30

## 2015-12-30 MED ORDER — EPHEDRINE SULFATE 50 MG/ML IJ SOLN
INTRAMUSCULAR | Status: DC | PRN
  Administered 2015-12-30: 5 mg via INTRAVENOUS

## 2015-12-30 MED ORDER — 0.9 % SODIUM CHLORIDE (POUR BTL) OPTIME
TOPICAL | Status: DC | PRN
  Administered 2015-12-30: 1000 mL

## 2015-12-30 MED ORDER — SUCCINYLCHOLINE CHLORIDE 200 MG/10ML IV SOSY
PREFILLED_SYRINGE | INTRAVENOUS | Status: DC | PRN
  Administered 2015-12-30: 120 mg via INTRAVENOUS

## 2015-12-30 MED ORDER — FENTANYL CITRATE (PF) 100 MCG/2ML IJ SOLN
INTRAMUSCULAR | Status: DC | PRN
  Administered 2015-12-30 (×4): 50 ug via INTRAVENOUS

## 2015-12-30 MED ORDER — HYDROMORPHONE HCL 1 MG/ML IJ SOLN
0.2500 mg | INTRAMUSCULAR | Status: DC | PRN
  Administered 2015-12-30: 0.5 mg via INTRAVENOUS
  Administered 2015-12-30 (×6): 0.25 mg via INTRAVENOUS

## 2015-12-30 MED ORDER — ROCURONIUM BROMIDE 50 MG/5ML IV SOSY
PREFILLED_SYRINGE | INTRAVENOUS | Status: AC
  Filled 2015-12-30: qty 5

## 2015-12-30 MED ORDER — LABETALOL HCL 5 MG/ML IV SOLN
INTRAVENOUS | Status: AC
  Administered 2015-12-30: 5 mg via INTRAVENOUS
  Filled 2015-12-30: qty 4

## 2015-12-30 MED ORDER — ROCURONIUM BROMIDE 10 MG/ML (PF) SYRINGE
PREFILLED_SYRINGE | INTRAVENOUS | Status: DC | PRN
  Administered 2015-12-30: 40 mg via INTRAVENOUS

## 2015-12-30 SURGICAL SUPPLY — 39 items
ATTRACTOMAT 16X20 MAGNETIC DRP (DRAPES) ×2 IMPLANT
BENZOIN TINCTURE PRP APPL 2/3 (GAUZE/BANDAGES/DRESSINGS) IMPLANT
BLADE HEX COATED 2.75 (ELECTRODE) ×2 IMPLANT
BLADE SURG 15 STRL LF DISP TIS (BLADE) ×1 IMPLANT
BLADE SURG 15 STRL SS (BLADE) ×1
CHLORAPREP W/TINT 26ML (MISCELLANEOUS) ×4 IMPLANT
CLIP TI MEDIUM 6 (CLIP) ×4 IMPLANT
CLIP TI WIDE RED SMALL 6 (CLIP) ×4 IMPLANT
COVER SURGICAL LIGHT HANDLE (MISCELLANEOUS) ×2 IMPLANT
DERMABOND ADVANCED (GAUZE/BANDAGES/DRESSINGS)
DERMABOND ADVANCED .7 DNX12 (GAUZE/BANDAGES/DRESSINGS) IMPLANT
DRAPE LAPAROTOMY T 98X78 PEDS (DRAPES) ×2 IMPLANT
ELECT PENCIL ROCKER SW 15FT (MISCELLANEOUS) ×2 IMPLANT
ELECT REM PT RETURN 9FT ADLT (ELECTROSURGICAL) ×2
ELECTRODE REM PT RTRN 9FT ADLT (ELECTROSURGICAL) ×1 IMPLANT
GAUZE SPONGE 4X4 12PLY STRL (GAUZE/BANDAGES/DRESSINGS) ×2 IMPLANT
GAUZE SPONGE 4X4 16PLY XRAY LF (GAUZE/BANDAGES/DRESSINGS) ×2 IMPLANT
GLOVE SURG ORTHO 8.0 STRL STRW (GLOVE) ×2 IMPLANT
GOWN STRL REUS W/TWL XL LVL3 (GOWN DISPOSABLE) ×6 IMPLANT
HEMOSTAT SURGICEL 2X4 FIBR (HEMOSTASIS) ×2 IMPLANT
ILLUMINATOR WAVEGUIDE N/F (MISCELLANEOUS) ×2 IMPLANT
KIT BASIN OR (CUSTOM PROCEDURE TRAY) ×2 IMPLANT
LIGHT WAVEGUIDE WIDE FLAT (MISCELLANEOUS) IMPLANT
NEEDLE HYPO 25X1 1.5 SAFETY (NEEDLE) ×2 IMPLANT
PACK BASIC VI WITH GOWN DISP (CUSTOM PROCEDURE TRAY) ×2 IMPLANT
STAPLER VISISTAT 35W (STAPLE) ×2 IMPLANT
STRIP CLOSURE SKIN 1/2X4 (GAUZE/BANDAGES/DRESSINGS) ×2 IMPLANT
SUT MNCRL AB 4-0 PS2 18 (SUTURE) ×2 IMPLANT
SUT SILK 2 0 (SUTURE)
SUT SILK 2-0 18XBRD TIE 12 (SUTURE) IMPLANT
SUT SILK 3 0 (SUTURE)
SUT SILK 3-0 18XBRD TIE 12 (SUTURE) IMPLANT
SUT VIC AB 3-0 SH 18 (SUTURE) ×2 IMPLANT
SYR BULB IRRIGATION 50ML (SYRINGE) ×2 IMPLANT
SYR CONTROL 10ML LL (SYRINGE) ×2 IMPLANT
TAPE CLOTH SURG 4X10 WHT LF (GAUZE/BANDAGES/DRESSINGS) ×2 IMPLANT
TOWEL OR 17X26 10 PK STRL BLUE (TOWEL DISPOSABLE) ×2 IMPLANT
TOWEL OR NON WOVEN STRL DISP B (DISPOSABLE) ×2 IMPLANT
YANKAUER SUCT BULB TIP 10FT TU (MISCELLANEOUS) ×2 IMPLANT

## 2015-12-30 NOTE — Interval H&P Note (Signed)
History and Physical Interval Note:  12/30/2015 1:46 PM  Gregory Sweeney  has presented today for surgery, with the diagnosis of PRIMARY HYPERPARATHYROIDISM.  The various methods of treatment have been discussed with the patient and family. After consideration of risks, benefits and other options for treatment, the patient has consented to    Procedure(s): NECK EXPLORATION AND PARATHYROIDECTOMY (N/A) as a surgical intervention .    The patient's history has been reviewed, patient examined, no change in status, stable for surgery.  I have reviewed the patient's chart and labs.  Questions were answered to the patient's satisfaction.    Velora Hecklerodd M. Ruble Buttler, MD, FACS General & Endocrine Surgery [facility] Office: [phone]  Yvette Roark MJudie Petit

## 2015-12-30 NOTE — Anesthesia Procedure Notes (Signed)
Procedure Name: Intubation Performed by: Doran ClayALDAY, Krayton Wortley R Pre-anesthesia Checklist: Patient identified, Emergency Drugs available, Suction available, Patient being monitored and Timeout performed Patient Re-evaluated:Patient Re-evaluated prior to inductionOxygen Delivery Method: Circle system utilized Preoxygenation: Pre-oxygenation with 100% oxygen Intubation Type: IV induction Laryngoscope Size: Miller and 3 Grade View: Grade I Tube type: Oral Tube size: 7.5 mm Number of attempts: 1 Airway Equipment and Method: Stylet Placement Confirmation: ETT inserted through vocal cords under direct vision,  positive ETCO2 and breath sounds checked- equal and bilateral Secured at: 23 cm Tube secured with: Tape Dental Injury: Teeth and Oropharynx as per pre-operative assessment

## 2015-12-30 NOTE — Brief Op Note (Signed)
12/30/2015  3:19 PM  PATIENT:  Chauncey ReadingJames A Snedeker  55 y.o. male  PRE-OPERATIVE DIAGNOSIS:  PRIMARY HYPERPARATHYROIDISM  POST-OPERATIVE DIAGNOSIS:  PRIMARY HYPERPARATHYROIDISM  PROCEDURE:  1. Neck exploration 2. Right inferior parathyroidectomy  SURGEON:  Surgeon(s) and Role:    * Darnell Levelodd Kais Monje, MD - Primary    * Glenna FellowsBenjamin Hoxworth, MD - Assisting  ANESTHESIA:   general  EBL:  No intake/output data recorded.  BLOOD ADMINISTERED:none  DRAINS: none   LOCAL MEDICATIONS USED:  MARCAINE     SPECIMEN:  Excision  DISPOSITION OF SPECIMEN:  PATHOLOGY  COUNTS:  YES  TOURNIQUET:  * No tourniquets in log *  DICTATION: .Other Dictation: Dictation Number [id number]  PLAN OF CARE: Admit for overnight observation  PATIENT DISPOSITION:  PACU - hemodynamically stable.   Delay start of Pharmacological VTE agent (>24hrs) due to surgical blood loss or risk of bleeding: yes  Velora Hecklerodd M. Gianfranco Araki, MD, FACS General & Endocrine Surgery [facility] Office: [phone]

## 2015-12-30 NOTE — Transfer of Care (Signed)
Immediate Anesthesia Transfer of Care Note  Patient: Gregory Sweeney  Procedure(s) Performed: Procedure(s): NECK EXPLORATION AND PARATHYROIDECTOMY (N/A)  Patient Location: PACU  Anesthesia Type:General  Level of Consciousness:  sedated, patient cooperative and responds to stimulation  Airway & Oxygen Therapy:Patient Spontanous Breathing and Patient connected to face mask oxgen  Post-op Assessment:  Report given to PACU RN and Post -op Vital signs reviewed and stable  Post vital signs:  Reviewed and stable  Last Vitals:  Vitals:   12/30/15 1105  BP: 137/88  Pulse: 68  Resp: 16  Temp: 37 C    Complications: No apparent anesthesia complications

## 2015-12-30 NOTE — Anesthesia Preprocedure Evaluation (Signed)
Anesthesia Evaluation  Patient identified by MRN, date of birth, ID band Patient awake    Reviewed: Allergy & Precautions, NPO status , Patient's Chart, lab work & pertinent test results  History of Anesthesia Complications Negative for: history of anesthetic complications  Airway Mallampati: I  TM Distance: >3 FB Neck ROM: Full    Dental  (+) Dental Advisory Given, Edentulous Upper, Missing, Poor Dentition   Pulmonary neg pulmonary ROS,    breath sounds clear to auscultation       Cardiovascular (-) hypertensionnegative cardio ROS   Rhythm:Regular Rate:Normal     Neuro/Psych negative neurological ROS     GI/Hepatic negative GI ROS, Neg liver ROS, neg GERD  ,  Endo/Other  hyperparathyroid  Renal/GU negative Renal ROS     Musculoskeletal   Abdominal   Peds  Hematology negative hematology ROS (+)   Anesthesia Other Findings   Reproductive/Obstetrics                             Anesthesia Physical Anesthesia Plan  ASA: II  Anesthesia Plan: General   Post-op Pain Management:    Induction: Intravenous  Airway Management Planned: Oral ETT  Additional Equipment:   Intra-op Plan:   Post-operative Plan: Extubation in OR  Informed Consent: I have reviewed the patients History and Physical, chart, labs and discussed the procedure including the risks, benefits and alternatives for the proposed anesthesia with the patient or authorized representative who has indicated his/her understanding and acceptance.   Dental advisory given  Plan Discussed with: CRNA and Surgeon  Anesthesia Plan Comments: (Plan routine monitors, GETA)        Anesthesia Quick Evaluation

## 2015-12-30 NOTE — Anesthesia Postprocedure Evaluation (Signed)
Anesthesia Post Note  Patient: Gregory ReadingJames A Sweeney  Procedure(s) Performed: Procedure(s) (LRB): NECK EXPLORATION AND PARATHYROIDECTOMY (N/A)  Patient location during evaluation: PACU Anesthesia Type: General Level of consciousness: awake and alert, oriented and patient cooperative Pain management: pain level controlled Vital Signs Assessment: post-procedure vital signs reviewed and stable Respiratory status: spontaneous breathing, nonlabored ventilation and respiratory function stable Cardiovascular status: blood pressure returned to baseline and stable Postop Assessment: no signs of nausea or vomiting Anesthetic complications: no    Last Vitals:  Vitals:   12/30/15 1705 12/30/15 1710  BP: (!) 184/99 (!) 181/100  Pulse:    Resp:    Temp:      Last Pain:  Vitals:   12/30/15 1700  TempSrc:   PainSc: 7                  Darl Brisbin,E. Tashon Capp

## 2015-12-31 ENCOUNTER — Encounter (HOSPITAL_COMMUNITY): Payer: Self-pay | Admitting: Surgery

## 2015-12-31 DIAGNOSIS — E210 Primary hyperparathyroidism: Secondary | ICD-10-CM | POA: Diagnosis not present

## 2015-12-31 LAB — BASIC METABOLIC PANEL
ANION GAP: 8 (ref 5–15)
BUN: 15 mg/dL (ref 6–20)
CHLORIDE: 104 mmol/L (ref 101–111)
CO2: 24 mmol/L (ref 22–32)
Calcium: 9.9 mg/dL (ref 8.9–10.3)
Creatinine, Ser: 1.27 mg/dL — ABNORMAL HIGH (ref 0.61–1.24)
GFR calc non Af Amer: >60 mL/min (ref >60)
Glucose, Bld: 142 mg/dL — ABNORMAL HIGH (ref 65–99)
POTASSIUM: 4.4 mmol/L (ref 3.5–5.1)
Sodium: 136 mmol/L (ref 135–145)

## 2015-12-31 MED ORDER — HYDROCODONE-ACETAMINOPHEN 5-325 MG PO TABS: 1.0000 | ORAL_TABLET | ORAL | 0 refills | Status: DC | PRN

## 2015-12-31 NOTE — Progress Notes (Signed)
Discharge instructions discussed with patient and wife, verbalized agreement and understanding, prescription given to patient

## 2015-12-31 NOTE — Discharge Summary (Signed)
Physician Discharge Summary [facility]  Patient ID: Gregory ReadingJames A Scheib MRN: [id number] DOB/AGE: 57/05/1960 57 y.o.  Admit date: 12/30/2015 Discharge date: 12/31/2015  Admission Diagnoses:  Primary hyperparathyroidism  Discharge Diagnoses:  Principal Problem:   Hyperparathyroidism (HCC) Active Problems:   Hyperparathyroidism, primary [facility](HCC)   Discharged Condition: good  Hospital Course: Patient was admitted for observation following parathyroid surgery.  Post op course was uncomplicated.  Pain was well controlled.  Tolerated diet.  Post op calcium level on morning following surgery was 9.9 mg/dl.  Patient was prepared for discharge home on POD#1.  Consults: None  Treatments: surgery: neck exploration and parathyroidectomy  Discharge Exam: Blood pressure 135/70, pulse 67, temperature 98.2 F (36.8 C), temperature source Oral, resp. rate 18, height 6' 3.5" (1.918 m), weight 90.3 kg (199 lb), SpO2 99 %. HEENT - clear Neck - wound dry and intact; Steri-strips in place; mild STS; voice approx normal  Disposition: Home  Discharge Instructions    Apply dressing    Complete by:  As directed    Apply light gauze dressing to neck before discharge home today.   Diet - low sodium heart healthy    Complete by:  As directed    Discharge instructions    Complete by:  As directed    [facility]  THYROID & PARATHYROID SURGERY:  POST-OP INSTRUCTIONS  Always review your discharge instruction sheet from the facility where your surgery was performed.  A prescription for pain medication may be given to you upon discharge.  Take your pain medication as prescribed.  If narcotic pain medicine is not needed, then you may take acetaminophen (Tylenol) or ibuprofen (Advil) as needed.  Take your usually prescribed medications unless otherwise directed.  If you need a refill on your pain medication, please contact your pharmacy. They will contact our office to  request authorization.  Prescriptions will not be processed by our office after 5 pm or on weekends.  Start with a light diet upon arrival home, such as soup and crackers or toast.  Be sure to drink plenty of fluids daily.  Resume your normal diet the day after surgery.  Most patients will experience some swelling and bruising on the chest and neck area.  Ice packs will help.  Swelling and bruising can take several days to resolve.   It is common to experience some constipation after surgery.  Increasing fluid intake and taking a stool softener will usually help or prevent this problem.  A mild laxative (Milk of Magnesia or Miralax) should be taken according to package directions if there has been no bowel movement after 48 hours.  You have steri-strips and a gauze dressing over your incision.  You may remove the gauze bandage on the second day after surgery, and you may shower at that time.  Leave your steri-strips (small skin tapes) in place directly over the incision.  These strips should remain on the skin for 5-7 days and then be removed.  You may get them wet in the shower and pat them dry.  You may resume regular (light) daily activities beginning the next day - such as daily self-care, walking, climbing stairs - gradually increasing activities as tolerated.  You may have sexual intercourse when it is comfortable.  Refrain from any heavy lifting or straining until approved by your doctor.  You may drive when you no longer are taking prescription pain medication, you can comfortably wear a seatbelt, and you can safely maneuver your  car and apply brakes.  You should see your doctor in the office for a follow-up appointment approximately two to three weeks after your surgery.  Make sure that you call for this appointment within a day or two after you arrive home to insure a convenient appointment time.  WHEN TO CALL YOUR DOCTOR: -- Fever greater than 101.5 -- Inability to urinate -- Nausea  and/or vomiting - persistent -- Extreme swelling or bruising -- Continued bleeding from incision -- Increased pain, redness, or drainage from the incision -- Difficulty swallowing or breathing -- Muscle cramping or spasms -- Numbness or tingling in hands or around lips  The clinic staff is available to answer your questions during regular business hours.  Please don't hesitate to call and ask to speak to one of the nurses if you have concerns.  Velora Hecklerodd M. Vernisha Bacote, MD, FACS General & Endocrine Surgery [facility] Office: [phone]  Website: [email or web address]   Increase activity slowly    Complete by:  As directed    No dressing needed    Complete by:  As directed        Medication List    TAKE these medications   HYDROcodone-acetaminophen 5-325 MG tablet Commonly known as:  NORCO/VICODIN Take 1-2 tablets by mouth every 4 (four) hours as needed for moderate pain.      Follow-up Information    Rhonda Linan M, MD. Schedule an appointment as soon as possible for a visit in 3 week(s).   Specialty:  General Surgery Contact information: [address] [place] [address] [phone]           Velora Hecklerodd M. Tenee Wish, MD, [facility] Office: [phone]   Signed: Velora HecklerGERKIN,Araf Clugston M 12/31/2015, 7:50 AM

## 2015-12-31 NOTE — Op Note (Signed)
NAME:  Gregory Sweeney, Gregory Sweeney                ACCOUNT NO.:  [id number]  MEDICAL RECORD NO.:  [id number]  LOCATION:  1525                         [facility]  PHYSICIAN:  Velora Hecklerodd M Florina Glas, MD      DATE OF BIRTH:  [birth year]  DATE OF PROCEDURE:  12/30/2015                              OPERATIVE REPORT   PREOPERATIVE DIAGNOSIS:  Primary hyperparathyroidism.  POSTOPERATIVE DIAGNOSIS:  Primary hyperparathyroidism.  PROCEDURES: 1. Neck exploration. 2. Right inferior parathyroidectomy.  SURGEON:  Velora Hecklerodd M Cornesha Radziewicz, MD  ASSISTANT:  Lorne SkeensBenjamin T. Hoxworth, MD  ANESTHESIA:  General.  ESTIMATED BLOOD LOSS:  Minimal.  PREPARATION:  Chlorhexidine.  COMPLICATIONS:  None.  INDICATIONS:  The patient is a 55 year old male, referred by Dr. Romero BellingSean Ellison for evaluation of suspected primary hyperparathyroidism.  The patient had an elevated serum calcium level of 11.6 and an elevated intact PTH level of 114.  Nuclear Medicine parathyroid scan failed to localize a parathyroid adenoma.  An area of intense uptake was noted in the left submandibular region.  CT scan of the neck was performed on September 13, 2015.  This showed a 17-mm nodule suspicious for parathyroid adenoma in the right inferior position near the thoracic inlet.  It also demonstrated a 12-mm oval nodule corresponding to the lesion seen on sestamibi scan in the left upper neck.  The patient now comes to Surgery for neck exploration and parathyroidectomy.  BODY OF REPORT:  [facility]  The patient was brought to the operating room and placed in a supine position on the operating room table.  Following administration of general anesthesia, the patient was positioned and then prepped and draped in usual aseptic fashion.  After ascertaining that an adequate level of anesthesia had been achieved, a Kocher incision was made with a 15-blade.  Dissection was carried through the subcutaneous tissues  and platysma.  Hemostasis was achieved with the electrocautery.  Subplatysmal flaps were elevated cephalad and caudad from the thyroid notch to the sternal notch.  A Mahorner self-retaining retractor was placed for exposure.  Strap muscles were incised in the midline and dissection was begun on the right side.  Strap muscles were reflected laterally exposing the right thyroid lobe.  Middle thyroid vein was divided between medium Ligaclips.  Right lobe was completely mobilized.  There appeared to be a normal superior parathyroid on the right.  Exploration inferiorly revealed an enlarged parathyroid gland extending from the inferior thyroid pole alongside the esophagus.  This was gently dissected out and elevated.  Vascular pedicle was divided between Ligaclips and the gland was excised.  It was submitted to Pathology.  It measured approximately 2 cm in size.  Frozen section biopsy confirmed hypercellular parathyroid tissue consistent with parathyroid adenoma.  Next, we began dissection on the left side of the neck.  Again strap muscles were reflected laterally.  Left thyroid lobe was mobilized. Venous tributaries were divided between Ligaclips.  Exploration inferiorly shows no evidence of enlarged parathyroid gland.  There appeared to be a normal parathyroid gland at the inferior pole of the left thyroid lobe.  The left superior pole was completely dissected out and  mobilized up to the vasculature.  There appeared to be a normal parathyroid gland just above the level of the inferior thyroid arteries insertion.  There does not appear to be any evidence of enlarged parathyroid gland.  There were no palpable lymph nodes.  There were no other masses noted.  There were no thyroid nodules.  At this point, a decision was made to discontinue exploration. Hemostasis was achieved throughout the wound.  Fibrillar was placed throughout the wound.  Strap muscles were reapproximated in the  midline with interrupted 3-0 Vicryl sutures.  Platysma was closed with interrupted 3-0 Vicryl sutures.  Skin was closed with a running 4-0 Monocryl subcuticular suture.  Local anesthetic was infiltrated.  The wound was washed and dried and Steri-Strips were applied.  Sterile dressings were applied.  The patient was awakened from anesthesia and brought to the recovery room.  The patient tolerated the procedure well.   Velora Hecklerodd M. Rashee Marschall, MD, FACS General & Endocrine Surgery [facility] Office: [phone]   TMG/MEDQ  D:  12/30/2015  T:  12/31/2015  Job:  [id number]  cc:   Gregary SignsSean A. Everardo AllEllison, MD  Lorne SkeensBenjamin T. Hoxworth, M.D. [address] [place] [address]

## 2016-01-20 ENCOUNTER — Ambulatory Visit (INDEPENDENT_AMBULATORY_CARE_PROVIDER_SITE_OTHER): Payer: Self-pay | Admitting: Urgent Care

## 2016-01-20 VITALS — BP 138/88 | HR 82 | Temp 98.1°F | Resp 16 | Ht 75.5 in | Wt 207.4 lb

## 2016-01-20 DIAGNOSIS — Z23 Encounter for immunization: Secondary | ICD-10-CM

## 2016-01-20 DIAGNOSIS — Z8679 Personal history of other diseases of the circulatory system: Secondary | ICD-10-CM

## 2016-01-20 DIAGNOSIS — E892 Postprocedural hypoparathyroidism: Secondary | ICD-10-CM

## 2016-01-20 DIAGNOSIS — Z9009 Acquired absence of other part of head and neck: Secondary | ICD-10-CM

## 2016-01-20 DIAGNOSIS — Z024 Encounter for examination for driving license: Secondary | ICD-10-CM

## 2016-01-20 NOTE — Patient Instructions (Addendum)
Keeping you healthy  Get these tests  Blood pressure- Have your blood pressure checked once a year by your healthcare provider.  Normal blood pressure is 120/80  Weight- Have your body mass index (BMI) calculated to screen for obesity.  BMI is a measure of body fat based on height and weight. You can also calculate your own BMI at [email or web address].com/bmi/.  Cholesterol- Have your cholesterol checked every year.  Diabetes- Have your blood sugar checked regularly if you have high blood pressure, high cholesterol, have a family history of diabetes or if you are overweight.  Screening for Colon Cancer- Colonoscopy starting at age 50.  Screening may begin sooner depending on your family history and other health conditions. Follow up colonoscopy as directed by your Gastroenterologist.  Screening for Prostate Cancer- Both blood work (PSA) and a rectal exam help screen for Prostate Cancer.  Screening begins at age 40 with African-American men and at age 50 with Caucasian men.  Screening may begin sooner depending on your family history.  Take these medicines  Aspirin- One aspirin daily can help prevent Heart disease and Stroke.  Flu shot- Every fall.  Tetanus- Every 10 years.  Zostavax- Once after the age of 60 to prevent Shingles.  Pneumonia shot- Once after the age of 65; if you are younger than 65, ask your healthcare provider if you need a Pneumonia shot.  Take these steps  Don't smoke- If you do smoke, talk to your doctor about quitting.  For tips on how to quit, go to www.smokefree.gov or call 1-800-QUIT-NOW.  Be physically active- Exercise 5 days a week for at least 30 minutes.  If you are not already physically active start slow and gradually work up to 30 minutes of moderate physical activity.  Examples of moderate activity include walking briskly, mowing the yard, dancing, swimming, bicycling, etc.  Eat a healthy diet- Eat a variety of healthy food such as fruits, vegetables, low  fat milk, low fat cheese, yogurt, lean meant, poultry, fish, beans, tofu, etc. For more information go to www.thenutritionsource.org  Drink alcohol in moderation- Limit alcohol intake to less than two drinks a day. Never drink and drive.  Dentist- Brush and floss twice daily; visit your dentist twice a year.  Depression- Your emotional health is as important as your physical health. If you're feeling down, or losing interest in things you would normally enjoy please talk to your healthcare provider.  Eye exam- Visit your eye doctor every year.  Safe sex- If you may be exposed to a sexually transmitted infection, use a condom.  Seat belts- Seat belts can save your life; always wear one.  Smoke/Carbon Monoxide detectors- These detectors need to be installed on the appropriate level of your home.  Replace batteries at least once a year.  Skin cancer- When out in the sun, cover up and use sunscreen 15 SPF or higher.  Violence- If anyone is threatening you, please tell your healthcare provider.  Living Will/ Health care power of attorney- Speak with your healthcare provider and family.    IF you received an x-ray today, you will receive an invoice from [facility]. Please contact [facility] at [phone] with questions or concerns regarding your invoice.   IF you received labwork today, you will receive an invoice from [facility]. Please contact [facility] at [phone] with questions or concerns regarding your invoice.   Our billing staff will not be able to assist you with questions regarding bills from these companies.    You will be contacted with the lab results as soon as they are available. The fastest way to get your results is to activate your My Chart account. Instructions are located on the last page of this paperwork. If you have not heard from us regarding the results in 2 weeks, please contact this office.      

## 2016-01-20 NOTE — Progress Notes (Signed)
Commercial Driver Medical Examination   Gregory Sweeney is a [age] y.o. male who presents today for a DOT physical exam. The patient reports history of HTN. He was taken off his BP medications 7 years ago. Reports history of white coat syndrome. Had a parathyroidectomy 12/30/2015, is without sequelae. He has f/u scheduled 01/29/2016.  The following portions of the patient's history were reviewed and updated as appropriate: allergies, current medications, past family history, past medical history, past social history and past surgical history.  Review of Systems  Constitutional: Negative for chills, diaphoresis, fever, malaise/fatigue and weight loss.  HENT: Negative for congestion, ear discharge, ear pain, hearing loss, nosebleeds, sore throat and tinnitus.   Eyes: Negative for blurred vision, double vision, photophobia, pain, discharge and redness.  Respiratory: Negative for cough, shortness of breath and wheezing.   Cardiovascular: Negative for chest pain, palpitations and leg swelling.  Gastrointestinal: Negative for abdominal pain, blood in stool, constipation, diarrhea, nausea and vomiting.  Genitourinary: Negative for dysuria, flank pain, frequency, hematuria and urgency.  Musculoskeletal: Negative for back pain, joint pain and myalgias.  Skin: Negative for itching and rash.  Neurological: Negative for dizziness, tingling, seizures, loss of consciousness, weakness and headaches.  Endo/Heme/Allergies: Negative for polydipsia.  Psychiatric/Behavioral: Negative for depression, hallucinations, memory loss, substance abuse and suicidal ideas. The patient is not nervous/anxious and does not have insomnia.    Objective:   BP 138/88 (BP Location: Left Arm, Patient Position: Sitting, Cuff Size: Large)   Pulse 82   Temp 98.1 F (36.7 C) (Oral)   Resp 16   Ht 6' 3.5" (1.918 m)   Wt 207 lb 6.4 oz (94.1 kg)   SpO2 99%   BMI 25.58 kg/m   Vision/hearing:  Visual Acuity Screening   Right eye  Left eye Both eyes  Without correction: 20/20 20/20 20/20   With correction:     Comments: 85 peripheral   Hearing Screening Comments: Whisper at [employer]10 feet   Patient can recognize and distinguish among traffic control signals and devices showing standard red, green, and amber colors.  Corrective lenses required: No  Monocular Vision?: No  Hearing aid requirement: No  Physical Exam  Constitutional: He is oriented to person, place, and time. He appears well-developed and well-nourished.  HENT:  TM's intact bilaterally, no effusions or erythema. Nasal turbinates pink and moist, nasal passages patent. No sinus tenderness. Oropharynx clear, mucous membranes moist, dentition in good repair.  Eyes: Conjunctivae and EOM are normal. Pupils are equal, round, and reactive to light. Right eye exhibits no discharge. Left eye exhibits no discharge. No scleral icterus.  Neck: Normal range of motion. Neck supple.  Patient is wearing a dressing over his anterior neck from parathyroidectomy.  Cardiovascular: Normal rate, regular rhythm and intact distal pulses.  Exam reveals no gallop and no friction rub.   No murmur heard. Pulmonary/Chest: No respiratory distress. He has no wheezes. He has no rales.  Abdominal: Soft. Bowel sounds are normal. He exhibits no distension and no mass. There is no tenderness.  Musculoskeletal: Normal range of motion. He exhibits no edema or tenderness.  Neurological: He is alert and oriented to person, place, and time. He has normal reflexes.  Skin: Skin is warm and dry. No rash noted. No erythema. No pallor.  Psychiatric: He has a normal mood and affect.   Labs:  Spec Gr: 1.010, Bl: neg, Sugar: Neg  Assessment:    Healthy male exam.  Meets standards in 4349 CFR 391.41;  qualifies for 2  year certificate.    Plan:   Medical examiners certificate completed and printed. Return as needed.

## 2016-03-10 ENCOUNTER — Ambulatory Visit (INDEPENDENT_AMBULATORY_CARE_PROVIDER_SITE_OTHER): Payer: BLUE CROSS/BLUE SHIELD | Admitting: Physician Assistant

## 2016-03-10 ENCOUNTER — Encounter: Payer: Self-pay | Admitting: Physician Assistant

## 2016-03-10 VITALS — BP 132/90 | HR 75 | Temp 98.2°F | Resp 16 | Ht 75.0 in | Wt 214.0 lb

## 2016-03-10 DIAGNOSIS — R102 Pelvic and perineal pain: Secondary | ICD-10-CM

## 2016-03-10 LAB — POCT URINALYSIS DIPSTICK
BILIRUBIN UA: NEGATIVE
Glucose, UA: NEGATIVE
KETONES UA: NEGATIVE
Leukocytes, UA: NEGATIVE
Nitrite, UA: NEGATIVE
PH UA: 6.5
Protein, UA: NEGATIVE
RBC UA: NEGATIVE
SPEC GRAV UA: 1.01
Urobilinogen, UA: 0.2

## 2016-03-10 LAB — PSA: PSA: 0.75 ng/mL (ref 0.10–4.00)

## 2016-03-10 NOTE — Progress Notes (Signed)
 Patient presents to clinic today c/o perineal/prostatic pain occurring intermittently over the past 4-5 months ago. Notes occurring once or twice a month every few months. Is noted as an aching pain, 1/10 and lasting about 2-3 minutes before resolving. Endorses symptoms are sporadic -- no particular activity seems to incite this episodes. Denies urinary urgency, frequency, hesitancy, dysuria, hematuria. Denies penile pain, testicular pain or swelling. Is not currently sexually active but was active about 2 months ago with one partner. Denies concern for STI. Denies fever, chills, malaise or fatigue. Has not taking anything for symptoms.   Past Medical History:  Diagnosis Date  . Appendicitis   . Bowel obstruction   . Gastric ulcer   . GERD (gastroesophageal reflux disease)   . History of kidney stones   . Hypertension    not on meds     No current outpatient prescriptions on file prior to visit.   No current facility-administered medications on file prior to visit.    Allergies  Allergen Reactions  . Ampicillin Anaphylaxis  . Banana Anaphylaxis  . Latex Hives, Shortness Of Breath, Itching and Rash  . Morphine And Related Hives, Shortness Of Breath, Itching and Nausea And Vomiting    headache  . Other Anaphylaxis    Just pecans  . Penicillins Hives and Shortness Of Breath    Has patient had a PCN reaction causing immediate rash, facial/tongue/throat swelling, SOB or lightheadedness with hypotension: Yes Has patient had a PCN reaction causing severe rash involving mucus membranes or skin necrosis: No Has patient had a PCN reaction that required hospitalization No Has patient had a PCN reaction occurring within the last 10 years: No If all of the above answers are "NO", then may proceed with Cephalosporin use.   . Shrimp [Shellfish Allergy] Anaphylaxis    Just shrimp  . Hibiclens [Chlorhexidine Gluconate] Itching    Stinging, burning     Family History  Problem Relation Age  of Onset  . Healthy Mother   . Healthy Father   . Diabetes Paternal Aunt     Great Aunt  . Healthy Brother     x1  . Healthy Sister     x2  . Cancer Neg Hx   . Heart disease Neg Hx   . Hypercalcemia Neg Hx     Social History   Social History  . Marital status: Single    Spouse name: N/A  . Number of children: 0  . Years of education: N/A   Occupational History  . 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 Smoking status: Never Smoker  . Smokeless tobacco: Never Used  . Alcohol use No  . Drug use: No  . Sexual activity: Not Currently   Other Topics Concern  . None   Social History Narrative  . None    Review of Systems - See HPI.  All other ROS are negative.  BP 132/90   Pulse 75   Temp 98.2 F (36.8 C) (Oral)   Resp 16   Ht 6' 3" (1.905 m)   Wt 214 lb (97.1 kg)   SpO2 98%   BMI 26.75 kg/m   Physical Exam  Constitutional: He is oriented to person, place, and time and well-developed, well-nourished, and in no distress.  HENT:  Head: Normocephalic and atraumatic.  Eyes: Conjunctivae are normal.  Neck: Neck supple.  Cardiovascular: Normal rate, regular rhythm, normal heart sounds   and intact distal pulses.   Pulmonary/Chest: Effort normal and breath sounds normal. No respiratory distress. He has no wheezes. He has no rales. He exhibits no tenderness.  Abdominal: Soft. There is no tenderness.  Genitourinary: Prostate normal, testes/scrotum normal and penis normal. Prostate is not enlarged and not tender.  Genitourinary Comments: Hypopigmentation of corona noted. No lesions noted. Chronic hypopigmentation since youth per patient.   Neurological: He is alert and oriented to person, place, and time.  Vitals reviewed.   Recent Results (from the past 2160 hour(s))  Basic metabolic panel     Status: Abnormal   Collection Time: 12/27/15  9:48 AM  Result Value Ref Range   Sodium 138 135 - 145 mmol/L    Potassium 4.7 3.5 - 5.1 mmol/L   Chloride 108 101 - 111 mmol/L   CO2 26 22 - 32 mmol/L   Glucose, Bld 130 (H) 65 - 99 mg/dL   BUN 12 6 - 20 mg/dL   Creatinine, Ser 1.16 0.61 - 1.24 mg/dL   Calcium 10.1 8.9 - 10.3 mg/dL   GFR calc non Af Amer >60 >60 mL/min   GFR calc Af Amer >60 >60 mL/min    Comment: (NOTE) The eGFR has been calculated using the CKD EPI equation. This calculation has not been validated in all clinical situations. eGFR's persistently <60 mL/min signify possible Chronic Kidney Disease.    Anion gap 4 (L) 5 - 15  CBC     Status: None   Collection Time: 12/27/15  9:48 AM  Result Value Ref Range   WBC 6.6 4.0 - 10.5 K/uL   RBC 4.89 4.22 - 5.81 MIL/uL   Hemoglobin 14.8 13.0 - 17.0 g/dL   HCT 43.7 39.0 - 52.0 %   MCV 89.4 78.0 - 100.0 fL   MCH 30.3 26.0 - 34.0 pg   MCHC 33.9 30.0 - 36.0 g/dL   RDW 12.9 11.5 - 15.5 %   Platelets 212 150 - 400 K/uL  Basic metabolic panel     Status: Abnormal   Collection Time: 12/31/15  4:35 AM  Result Value Ref Range   Sodium 136 135 - 145 mmol/L   Potassium 4.4 3.5 - 5.1 mmol/L   Chloride 104 101 - 111 mmol/L   CO2 24 22 - 32 mmol/L   Glucose, Bld 142 (H) 65 - 99 mg/dL   BUN 15 6 - 20 mg/dL   Creatinine, Ser 1.27 (H) 0.61 - 1.24 mg/dL   Calcium 9.9 8.9 - 10.3 mg/dL   GFR calc non Af Amer >60 >60 mL/min   GFR calc Af Amer >60 >60 mL/min    Comment: (NOTE) The eGFR has been calculated using the CKD EPI equation. This calculation has not been validated in all clinical situations. eGFR's persistently <60 mL/min signify possible Chronic Kidney Disease.    Anion gap 8 5 - 15    Assessment/Plan: 1. Perineal pain in male Sporadic, lasting only few minutes and occurring once per 1-2 months. Exam unremarkable. Urine dip within normal limits. Will check PSA today. May be pelvic muscle dysfunction/spasm. Supportive measures reviewed. Urology referral if labs good but this continues or if labs abnormal. - POCT Urinalysis Dipstick -  PSA    Martin, William Cody, PA-C 

## 2016-03-10 NOTE — Progress Notes (Signed)
Pre visit review using our clinic review tool, if applicable. No additional management support is needed unless otherwise documented below in the visit note. 

## 2016-03-10 NOTE — Patient Instructions (Signed)
Your exam and urine testing look good. Will check repeat PSA level today. Since so sporadic may be pelvic muscle strain/spasm.  If workup here normal but continuing, can set you up with Urology for further assessment.

## 2016-06-12 ENCOUNTER — Encounter: Payer: BLUE CROSS/BLUE SHIELD | Admitting: Physician Assistant

## 2016-10-29 ENCOUNTER — Ambulatory Visit: Payer: BLUE CROSS/BLUE SHIELD | Admitting: Physician Assistant

## 2016-11-06 ENCOUNTER — Encounter: Payer: Self-pay | Admitting: Physician Assistant

## 2016-11-06 ENCOUNTER — Ambulatory Visit: Payer: BLUE CROSS/BLUE SHIELD | Admitting: Physician Assistant

## 2016-11-06 ENCOUNTER — Ambulatory Visit (INDEPENDENT_AMBULATORY_CARE_PROVIDER_SITE_OTHER): Payer: BLUE CROSS/BLUE SHIELD | Admitting: Physician Assistant

## 2016-11-06 VITALS — BP 132/80 | HR 69 | Temp 98.7°F | Resp 14 | Ht 75.0 in | Wt 204.0 lb

## 2016-11-06 DIAGNOSIS — R34 Anuria and oliguria: Secondary | ICD-10-CM

## 2016-11-06 DIAGNOSIS — R1900 Intra-abdominal and pelvic swelling, mass and lump, unspecified site: Secondary | ICD-10-CM

## 2016-11-06 DIAGNOSIS — R809 Proteinuria, unspecified: Secondary | ICD-10-CM

## 2016-11-06 DIAGNOSIS — Z23 Encounter for immunization: Secondary | ICD-10-CM | POA: Diagnosis not present

## 2016-11-06 LAB — POCT URINALYSIS DIPSTICK
BILIRUBIN UA: NEGATIVE
GLUCOSE UA: NEGATIVE
Ketones, UA: NEGATIVE
Leukocytes, UA: NEGATIVE
Nitrite, UA: NEGATIVE
RBC UA: NEGATIVE
Urobilinogen, UA: 1 E.U./dL
pH, UA: 6 (ref 5.0–8.0)

## 2016-11-06 LAB — BASIC METABOLIC PANEL
BUN: 21 mg/dL (ref 6–23)
CALCIUM: 10 mg/dL (ref 8.4–10.5)
CO2: 28 meq/L (ref 19–32)
Chloride: 105 mEq/L (ref 96–112)
Creatinine, Ser: 1.26 mg/dL (ref 0.40–1.50)
GFR: 75.44 mL/min (ref >60.00)
GLUCOSE: 92 mg/dL (ref 70–99)
Potassium: 4.9 mEq/L (ref 3.5–5.1)
Sodium: 140 mEq/L (ref 135–145)

## 2016-11-06 NOTE — Progress Notes (Signed)
Patient presents to clinic today c/o mass underneath the skin of L side first noted 6 months ago. Notes that the area has gotten larger with time. Denies any erythema, warmth or tenderness to the area. Has noted sometimes having discomfort in the area with prolonged physical activity.  Denies mass elsewhere. Denies fever, chills, malaise or fatigue. Has noted occasional decreased urinary output but only when he tries to urinate after holding bladder for an extended period of time. Denies nocturia, urinary urgency or frequency.   Past Medical History:  Diagnosis Date  . Appendicitis   . Bowel obstruction ([facility])   . Gastric ulcer   . GERD (gastroesophageal reflux disease)   . History of kidney stones   . Hypertension    not on meds     No current outpatient prescriptions on file prior to visit.   No current facility-administered medications on file prior to visit.     Allergies  Allergen Reactions  . Ampicillin Anaphylaxis  . Banana Anaphylaxis  . Latex Hives, Shortness Of Breath, Itching and Rash  . Morphine And Related Hives, Shortness Of Breath, Itching and Nausea And Vomiting    headache  . Other Anaphylaxis    Just pecans  . Penicillins Hives and Shortness Of Breath    Has patient had a PCN reaction causing immediate rash, facial/tongue/throat swelling, SOB or lightheadedness with hypotension: Yes Has patient had a PCN reaction causing severe rash involving mucus membranes or skin necrosis: No Has patient had a PCN reaction that required hospitalization No Has patient had a PCN reaction occurring within the last 10 years: No If all of the above answers are "NO", then may proceed with Cephalosporin use.   . Shrimp [Shellfish Allergy] Anaphylaxis    Just shrimp  . Hibiclens [Chlorhexidine Gluconate] Itching    Stinging, burning     Family History  Problem Relation Age of Onset  . Healthy Mother   . Healthy Father   . Diabetes Paternal Recruitment consultant  .  Healthy Brother        x1  . Healthy Sister        x2  . Cancer Neg Hx   . Heart disease Neg Hx   . Hypercalcemia Neg Hx     Social History   Social History  . Marital status: Single    Spouse name: N/A  . Number of children: 0  . Years of education: N/A   Occupational History  . 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 Smoking status: Never Smoker  . Smokeless tobacco: Never Used  . Alcohol use No  . Drug use: No  . Sexual activity: Not Currently   Other Topics Concern  . None   Social History Narrative  . None   Review of Systems - See HPI.  All other ROS are negative.  BP 132/80   Pulse 69   Temp 98.7 F (37.1 C) (Oral)   Resp 14   Ht  (1.905 m)   Wt 204 lb (92.5 kg)   SpO2 98%   BMI 25.50 kg/m   Physical Exam  Constitutional: He is oriented to person, place, and time and well-developed, well-nourished, and in no distress.  HENT:  Head: Normocephalic and atraumatic.  Eyes: Conjunctivae are normal.  Neck: Neck supple.  Cardiovascular: Normal rate, regular rhythm, normal heart sounds and intact  distal pulses.   Pulmonary/Chest: Effort normal and breath sounds normal. No respiratory distress. He has no wheezes. He has no rales. He exhibits no tenderness.  Abdominal: Soft. Bowel sounds are normal. He exhibits no distension. There is no tenderness.  Negative CVA tenderness.  Neurological: He is alert and oriented to person, place, and time.  Skin: Skin is warm and dry. No rash noted.     Psychiatric: Affect normal.  Vitals reviewed.  Assessment/Plan: 1. 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 No other symptoms. PSAs have been stable. Declines DRE today. UA with questionable trace protein. Will check BMP. - POCT Urinalysis Dipstick - Basic metabolic panel  2. Proteinuria, unspecified type BMP today due to questionable  trace protein on Urine dip.  3. Need for immunization against influenza Flu shot given. - Flu Vaccine QUAD 36+ mos IM  4. Abdominal wall mass of left flank Lipoma versus adenoma. Will obtain imaging to further assess.  - [place] Miscellaneo[place] Localization; Future   Piedad Climes, PA-C

## 2016-11-06 NOTE — Progress Notes (Signed)
Pre visit review using our clinic review tool, if applicable. No additional management support is needed unless otherwise documented below in the visit note. 

## 2016-11-06 NOTE — Patient Instructions (Signed)
Urine looks good overall today. There was some trace protein in the urine so I am checking kidney function just to be cautious. Keep hydrated.  The mass seems to be an adenoma under the skin but is very hard to tell. We are ordering imaging to further assess so that we can get a better idea of what this is. Thankfully it is not hard and is mobile so those are reassuring findings.

## 2016-11-11 ENCOUNTER — Encounter: Payer: Self-pay | Admitting: Emergency Medicine

## 2016-11-25 ENCOUNTER — Telehealth: Payer: Self-pay | Admitting: Physician Assistant

## 2016-11-25 DIAGNOSIS — R222 Localized swelling, mass and lump, trunk: Secondary | ICD-10-CM

## 2016-11-25 NOTE — Telephone Encounter (Signed)
Order has been updated. Please call her so patient can be scheduled.

## 2016-11-25 NOTE — Telephone Encounter (Signed)
Gregory Sweeney with [facility] calling to request order that was entered for [name] Localization needs to be changed to [name](abdominal limited).  Please return call to her at [phone] and advise when order has been updated so appt can be scheduled.

## 2016-11-25 NOTE — Telephone Encounter (Signed)
Returned call to [place], notified her order has been updated as requested.  She states she will contact patient to schedule appt.

## 2016-12-04 ENCOUNTER — Ambulatory Visit
Admission: RE | Admit: 2016-12-04 | Discharge: 2016-12-04 | Disposition: A | Discharge status: Home / Self Care | Payer: No Typology Code available for payment source | Source: Ambulatory Visit | Attending: Physician Assistant | Admitting: Physician Assistant

## 2016-12-04 DIAGNOSIS — R1900 Intra-abdominal and pelvic swelling, mass and lump, unspecified site: Secondary | ICD-10-CM | POA: Diagnosis not present

## 2016-12-04 DIAGNOSIS — R222 Localized swelling, mass and lump, trunk: Secondary | ICD-10-CM

## 2016-12-08 ENCOUNTER — Other Ambulatory Visit: Payer: Self-pay | Admitting: Physician Assistant

## 2016-12-10 ENCOUNTER — Other Ambulatory Visit: Payer: Self-pay | Admitting: Physician Assistant

## 2016-12-10 DIAGNOSIS — R1907 Generalized intra-abdominal and pelvic swelling, mass and lump: Secondary | ICD-10-CM

## 2016-12-10 DIAGNOSIS — R1900 Intra-abdominal and pelvic swelling, mass and lump, unspecified site: Secondary | ICD-10-CM

## 2016-12-13 ENCOUNTER — Other Ambulatory Visit: Payer: Self-pay | Admitting: Physician Assistant

## 2016-12-13 DIAGNOSIS — R1909 Other intra-abdominal and pelvic swelling, mass and lump: Secondary | ICD-10-CM

## 2016-12-13 DIAGNOSIS — R1900 Intra-abdominal and pelvic swelling, mass and lump, unspecified site: Secondary | ICD-10-CM

## 2016-12-13 DIAGNOSIS — R1907 Generalized intra-abdominal and pelvic swelling, mass and lump: Secondary | ICD-10-CM

## 2017-01-19 ENCOUNTER — Ambulatory Visit
Admission: RE | Admit: 2017-01-19 | Discharge: 2017-01-19 | Disposition: A | Discharge status: Home / Self Care | Payer: 59 | Source: Ambulatory Visit | Attending: Physician Assistant | Admitting: Physician Assistant

## 2017-01-19 DIAGNOSIS — R1907 Generalized intra-abdominal and pelvic swelling, mass and lump: Secondary | ICD-10-CM

## 2017-01-19 DIAGNOSIS — R1900 Intra-abdominal and pelvic swelling, mass and lump, unspecified site: Secondary | ICD-10-CM

## 2017-01-19 DIAGNOSIS — N281 Cyst of kidney, acquired: Secondary | ICD-10-CM | POA: Diagnosis not present

## 2017-01-19 MED ORDER — GADOBENATE DIMEGLUMINE 529 MG/ML IV SOLN
20.0000 mL | Freq: Once | INTRAVENOUS | Status: AC | PRN
  Administered 2017-01-19: 20 mL via INTRAVENOUS

## 2017-05-04 ENCOUNTER — Ambulatory Visit: Payer: 59 | Admitting: Physician Assistant

## 2017-05-06 ENCOUNTER — Ambulatory Visit: Payer: 59 | Admitting: Physician Assistant

## 2017-05-11 ENCOUNTER — Encounter: Payer: Self-pay | Admitting: Physician Assistant

## 2017-05-11 ENCOUNTER — Ambulatory Visit (INDEPENDENT_AMBULATORY_CARE_PROVIDER_SITE_OTHER): Payer: 59 | Admitting: Physician Assistant

## 2017-05-11 ENCOUNTER — Other Ambulatory Visit: Payer: Self-pay

## 2017-05-11 VITALS — BP 136/80 | HR 67 | Temp 98.3°F | Resp 17 | Ht 74.5 in | Wt 210.4 lb

## 2017-05-11 DIAGNOSIS — R3911 Hesitancy of micturition: Secondary | ICD-10-CM

## 2017-05-11 DIAGNOSIS — R5383 Other fatigue: Secondary | ICD-10-CM

## 2017-05-11 LAB — POCT URINALYSIS DIPSTICK
Bilirubin, UA: NEGATIVE
Blood, UA: NEGATIVE
Glucose, UA: NEGATIVE
Ketones, UA: NEGATIVE
LEUKOCYTES UA: NEGATIVE
NITRITE UA: NEGATIVE
PROTEIN UA: NEGATIVE
Spec Grav, UA: 1.015 (ref 1.010–1.025)
Urobilinogen, UA: 0.2 E.U./dL
pH, UA: 6.5 (ref 5.0–8.0)

## 2017-05-11 LAB — CBC WITH DIFFERENTIAL/PLATELET
Basophils Absolute: 0 10*3/uL (ref 0.0–0.1)
Basophils Relative: 0.5 % (ref 0.0–3.0)
EOS PCT: 5.9 % — AB (ref 0.0–5.0)
Eosinophils Absolute: 0.4 10*3/uL (ref 0.0–0.7)
HCT: 43 % (ref 39.0–52.0)
HEMOGLOBIN: 14.5 g/dL (ref 13.0–17.0)
LYMPHS PCT: 29 % (ref 12.0–46.0)
Lymphs Abs: 1.9 10*3/uL (ref 0.7–4.0)
MCHC: 33.8 g/dL (ref 30.0–36.0)
MCV: 89.5 fl (ref 78.0–100.0)
MONOS PCT: 8 % (ref 3.0–12.0)
Monocytes Absolute: 0.5 10*3/uL (ref 0.1–1.0)
Neutro Abs: 3.6 10*3/uL (ref 1.4–7.7)
Neutrophils Relative %: 56.6 % (ref 43.0–77.0)
Platelets: 206 10*3/uL (ref 150.0–400.0)
RBC: 4.8 Mil/uL (ref 4.22–5.81)
RDW: 13.7 % (ref 11.5–15.5)
WBC: 6.4 10*3/uL (ref 4.0–10.5)

## 2017-05-11 LAB — COMPREHENSIVE METABOLIC PANEL
ALBUMIN: 4.1 g/dL (ref 3.5–5.2)
ALK PHOS: 47 U/L (ref 39–117)
ALT: 15 U/L (ref 0–53)
AST: 14 U/L (ref 0–37)
BUN: 10 mg/dL (ref 6–23)
CO2: 27 mEq/L (ref 19–32)
Calcium: 9.4 mg/dL (ref 8.4–10.5)
Chloride: 103 mEq/L (ref 96–112)
Creatinine, Ser: 1.39 mg/dL (ref 0.40–1.50)
GFR: 67.24 mL/min (ref >60.00)
Glucose, Bld: 121 mg/dL — ABNORMAL HIGH (ref 70–99)
POTASSIUM: 4.3 meq/L (ref 3.5–5.1)
Sodium: 137 mEq/L (ref 135–145)
TOTAL PROTEIN: 7.2 g/dL (ref 6.0–8.3)
Total Bilirubin: 0.5 mg/dL (ref 0.2–1.2)

## 2017-05-11 LAB — PSA: PSA: 0.66 ng/mL (ref 0.10–4.00)

## 2017-05-11 LAB — SEDIMENTATION RATE: Sed Rate: 7 mm/hr (ref 0–20)

## 2017-05-11 LAB — TSH: TSH: 2.79 u[IU]/mL (ref 0.35–4.50)

## 2017-05-11 NOTE — Progress Notes (Signed)
Patient presents to clinic today c/o a couple of episodes in the past month where he will note R-sided flank pain, followed by sweats, nausea, urinary hesitancy and urinary frequency. States this all lasts about 24 hours and resolved on its own. Denies fever, chills, weight loss. Has had prior workup including MRI for a R flank SQ mass that patient noted. Workup unremarkable for finding at site of patient's noted abnormality. States he has not symptoms whatsoever of flank pain or urinary symptoms between these episodes. Does have occasional nocturia.  Past Medical History:  Diagnosis Date  . Appendicitis   . Bowel obstruction ([place])   . Gastric ulcer   . GERD (gastroesophageal reflux disease)   . History of kidney stones   . Hypertension    not on meds     No current outpatient medications on file prior to visit.   No current facility-administered medications on file prior to visit.     Allergies  Allergen Reactions  . Ampicillin Anaphylaxis  . Banana Anaphylaxis  . Latex Hives, Shortness Of Breath, Itching and Rash  . Morphine And Related Hives, Shortness Of Breath, Itching and Nausea And Vomiting    headache  . Other Anaphylaxis    Just pecans  . Penicillins Hives and Shortness Of Breath    Has patient had a PCN reaction causing immediate rash, facial/tongue/throat swelling, SOB or lightheadedness with hypotension: Yes Has patient had a PCN reaction causing severe rash involving mucus membranes or skin necrosis: No Has patient had a PCN reaction that required hospitalization No Has patient had a PCN reaction occurring within the last 10 years: No If all of the above answers are "NO", then may proceed with Cephalosporin use.   . Shrimp [Shellfish Allergy] Anaphylaxis    Just shrimp  . Hibiclens [Chlorhexidine Gluconate] Itching    Stinging, burning     Family History  Problem Relation Age of Onset  . Healthy Mother   . Healthy Father   . Diabetes Paternal Regulatory affairs officer  . Healthy Brother        x1  . Healthy Sister        x2  . Cancer Neg Hx   . Heart disease Neg Hx   . Hypercalcemia Neg Hx     Social History   Socioeconomic History  . Marital status: Single    Spouse name: None  . Number of children: 0  . Years of education: None  . Highest education level: None  Social Needs  . Financial resource strain: None  . Food insecurity - worry: None  . Food insecurity - inability: None  . Transportation needs - medical: None  . Transportation needs - non-medical: None  Occupational History  . Occupation: Buyer, retail: [employer]  . Occupation: Wellsite geologist    Comment: Human resources officer  Tobacco Use  . Smoking status: Never Smoker  . Smokeless tobacco: Never Used  Substance and Sexual Activity  . Alcohol use: No    Alcohol/week: 0.0 oz  . Drug use: No  . Sexual activity: Not Currently  Other Topics Concern  . None  Social History Narrative  . None   Review of Systems - See HPI.  All other ROS are negative.  BP 136/80   Pulse 67   Temp 98.3 F (36.8 C) (Oral)   Resp 17   Ht 6' 2.5" (1.892 m)   Wt 210 lb 6.4 oz (95.4 kg)  SpO2 98%   BMI 26.65 kg/m   Physical Exam  Constitutional: He is oriented to person, place, and time and well-developed, well-nourished, and in no distress.  HENT:  Head: Normocephalic and atraumatic.  Right Ear: External ear normal.  Left Ear: External ear normal.  Nose: Nose normal.  Mouth/Throat: Oropharynx is clear and moist. No oropharyngeal exudate.  TM within normal limits bilaterally  Eyes: Conjunctivae are normal.  Neck: Neck supple.  Cardiovascular: Normal rate, regular rhythm, normal heart sounds and intact distal pulses.  Pulmonary/Chest: Effort normal and breath sounds normal. No respiratory distress. He has no wheezes. He has no rales. He exhibits no tenderness.  Abdominal: Soft. Bowel sounds are normal. He exhibits no distension and no mass. There is no  tenderness. There is no rebound and no guarding.  Genitourinary: Prostate normal. Rectal exam shows no external hemorrhoid, no internal hemorrhoid, no mass and guaiac negative stool. Prostate is not enlarged and not tender.  Genitourinary Comments: + sentinel tag noted at 71 o clock position - likely from prior external hemorrhoid or fissure  Neurological: He is alert and oriented to person, place, and time.  Skin: Skin is warm and dry. No rash noted.  Psychiatric: Affect normal.  Vitals reviewed.  Recent Results (from the past 2160 hour(s))  POCT urinalysis dipstick     Status: Normal   Collection Time: 05/11/17 10:00 AM  Result Value Ref Range   Color, UA yellow    Clarity, UA clear    Glucose, UA negative    Bilirubin, UA negative    Ketones, UA negative    Spec Grav, UA 1.015 1.010 - 1.025   Blood, UA negative    pH, UA 6.5 5.0 - 8.0   Protein, UA negative    Urobilinogen, UA 0.2 0.2 or 1.0 E.U./dL   Nitrite, UA negative    Leukocytes, UA Negative Negative   Appearance     Odor     Assessment/Plan: 1. Urinary hesitancy Urine dip unremarkable. PSA today. DRE unremarkable. Odd that symptoms are sporadic and not on a regular basis. Will consider Flomax trial versus Urology assessment once results are in.  - POCT urinalysis dipstick - PSA  2. Other fatigue Patient is having these odd spells once a month of sweating, flank pain and fatigue. Exam unremarkable. Will check labs today. CT pending lab results.  - CBC w/Diff - Comp Met (CMET) - TSH - Sedimentation rate   Leeanne Rio, PA-C

## 2017-05-11 NOTE — Patient Instructions (Signed)
Please go to the lab today for blood work.  I will call you with your results. We will alter treatment regimen(s) if indicated by your results.   Please keep a symptom journal. We will likely proceed with imaging once labs are in.

## 2017-05-14 ENCOUNTER — Telehealth: Payer: Self-pay | Admitting: Physician Assistant

## 2017-05-14 NOTE — Telephone Encounter (Signed)
Copied from CRM [phone]. Topic: Quick Communication - See Telephone Encounter >> May 14, 2017 11:27 AM Jolayne Hainesaylor, Brittany L wrote: CRM for notification. See Telephone encounter for: 05/14/17.  Pt is requesting his labs from last Friday. Please call patient at [phone]

## 2017-05-17 ENCOUNTER — Other Ambulatory Visit: Payer: Self-pay | Admitting: Physician Assistant

## 2017-05-17 DIAGNOSIS — R634 Abnormal weight loss: Secondary | ICD-10-CM

## 2017-05-17 DIAGNOSIS — R109 Unspecified abdominal pain: Principal | ICD-10-CM

## 2017-05-17 DIAGNOSIS — G8929 Other chronic pain: Secondary | ICD-10-CM

## 2017-05-17 NOTE — Telephone Encounter (Signed)
Patient was contacted for lab results. I have spoken with him and results have been given.

## 2017-05-27 ENCOUNTER — Ambulatory Visit
Admission: RE | Admit: 2017-05-27 | Discharge: 2017-05-27 | Disposition: A | Discharge status: Home / Self Care | Payer: 59 | Source: Ambulatory Visit | Attending: Physician Assistant | Admitting: Physician Assistant

## 2017-05-27 DIAGNOSIS — R10A1 Flank pain, right side: Secondary | ICD-10-CM

## 2017-05-27 DIAGNOSIS — R109 Unspecified abdominal pain: Principal | ICD-10-CM

## 2017-05-27 DIAGNOSIS — N3289 Other specified disorders of bladder: Secondary | ICD-10-CM | POA: Diagnosis not present

## 2017-05-27 DIAGNOSIS — G8929 Other chronic pain: Secondary | ICD-10-CM

## 2017-05-27 MED ORDER — IOPAMIDOL (ISOVUE-300) INJECTION 61%
100.0000 mL | Freq: Once | INTRAVENOUS | Status: AC | PRN
  Administered 2017-05-27: 100 mL via INTRAVENOUS

## 2017-05-28 ENCOUNTER — Other Ambulatory Visit: Payer: Self-pay | Admitting: Emergency Medicine

## 2017-05-28 DIAGNOSIS — N3289 Other specified disorders of bladder: Secondary | ICD-10-CM

## 2017-06-14 DIAGNOSIS — K567 Ileus, unspecified: Secondary | ICD-10-CM | POA: Diagnosis not present

## 2017-06-14 DIAGNOSIS — K56609 Unspecified intestinal obstruction, unspecified as to partial versus complete obstruction: Secondary | ICD-10-CM | POA: Diagnosis not present

## 2017-06-14 DIAGNOSIS — R1011 Right upper quadrant pain: Secondary | ICD-10-CM | POA: Diagnosis not present

## 2017-06-14 DIAGNOSIS — N179 Acute kidney failure, unspecified: Secondary | ICD-10-CM | POA: Insufficient documentation

## 2017-06-14 DIAGNOSIS — R55 Syncope and collapse: Secondary | ICD-10-CM | POA: Diagnosis not present

## 2017-06-14 DIAGNOSIS — N200 Calculus of kidney: Secondary | ICD-10-CM | POA: Diagnosis not present

## 2017-06-14 DIAGNOSIS — K56600 Partial intestinal obstruction, unspecified as to cause: Secondary | ICD-10-CM | POA: Diagnosis not present

## 2017-06-14 DIAGNOSIS — Z8719 Personal history of other diseases of the digestive system: Secondary | ICD-10-CM | POA: Insufficient documentation

## 2017-06-14 DIAGNOSIS — K56699 Other intestinal obstruction unspecified as to partial versus complete obstruction: Secondary | ICD-10-CM | POA: Diagnosis not present

## 2017-06-14 DIAGNOSIS — R1031 Right lower quadrant pain: Secondary | ICD-10-CM | POA: Diagnosis not present

## 2017-06-14 DIAGNOSIS — R51 Headache: Secondary | ICD-10-CM | POA: Diagnosis not present

## 2017-06-25 ENCOUNTER — Ambulatory Visit (INDEPENDENT_AMBULATORY_CARE_PROVIDER_SITE_OTHER): Payer: 59 | Admitting: Physician Assistant

## 2017-06-25 ENCOUNTER — Encounter: Payer: Self-pay | Admitting: Physician Assistant

## 2017-06-25 ENCOUNTER — Other Ambulatory Visit: Payer: Self-pay

## 2017-06-25 VITALS — BP 130/82 | HR 88 | Temp 98.2°F | Resp 16 | Ht 74.5 in | Wt 206.0 lb

## 2017-06-25 DIAGNOSIS — K56609 Unspecified intestinal obstruction, unspecified as to partial versus complete obstruction: Secondary | ICD-10-CM

## 2017-06-25 DIAGNOSIS — N179 Acute kidney failure, unspecified: Secondary | ICD-10-CM | POA: Diagnosis not present

## 2017-06-25 DIAGNOSIS — N401 Enlarged prostate with lower urinary tract symptoms: Secondary | ICD-10-CM | POA: Diagnosis not present

## 2017-06-25 LAB — BASIC METABOLIC PANEL
BUN: 17 mg/dL (ref 6–23)
CO2: 28 meq/L (ref 19–32)
Calcium: 9.3 mg/dL (ref 8.4–10.5)
Chloride: 104 mEq/L (ref 96–112)
Creatinine, Ser: 1.27 mg/dL (ref 0.40–1.50)
GFR: 74.59 mL/min (ref >60.00)
GLUCOSE: 90 mg/dL (ref 70–99)
POTASSIUM: 4.6 meq/L (ref 3.5–5.1)
SODIUM: 140 meq/L (ref 135–145)

## 2017-06-25 NOTE — Patient Instructions (Addendum)
Please go to the lab today for blood work.  I will call you with your results. We will alter treatment regimen(s) if indicated by your results.   Please stay well-hydrated.  Limit caffeine intake.  Do not keep holding your bladder on extended car rides -- this is not good for your bladder or prostate.  Stop every 2 hours at least to stretch legs, refill water bottle and to urinate if needed.   I encourage you to increase hydration and the amount of fiber in your diet.  Start a daily probiotic (Align, Culturelle, Digestive Advantage, etc.). If no bowel movement within 24 hours, take 2 Tbs of Milk of Magnesia in a 4 oz glass of warmed prune juice every 2-3 days to help promote bowel movement. If no results within 24 hours, then repeat above regimen, adding a Dulcolax stool softener to regimen. If this does not promote a bowel movement, please call the office.   Small Bowel Obstruction A small bowel obstruction means that something is blocking the small bowel. The small bowel is also called the small intestine. It is the long tube that connects the stomach to the colon. An obstruction will stop food and fluids from passing through the small bowel. Treatment depends on what is causing the problem and how bad the problem is. Follow these instructions at home:  Get a lot of rest.  Follow your diet as told by your doctor. You may need to: ? Only drink clear liquids until you start to get better. ? Avoid solid foods as told by your doctor.  Take over-the-counter and prescription medicines only as told by your doctor.  Keep all follow-up visits as told by your doctor. This is important. Contact a doctor if:  You have a fever.  You have chills. Get help right away if:  You have pain or cramps that get worse.  You throw up (vomit) blood.  You have a feeling of being sick to your stomach (nausea) that does not go away.  You cannot stop throwing up.  You cannot drink fluids.  You feel  confused.  You feel dry or thirsty (dehydrated).  Your belly gets more bloated.  You feel weak or you pass out (faint). This information is not intended to replace advice given to you by your health care provider. Make sure you discuss any questions you have with your health care provider. Document Released: 03/19/2004 Document Revised: 10/07/2015 Document Reviewed: 04/05/2014 Elsevier Interactive Patient Education  [employer].

## 2017-06-25 NOTE — Progress Notes (Signed)
Patient presents to clinic today for hospital follow-up. Patient was seen in [place] at [facility] for abdominal pain and constipation. Evaluation in the ER revealed partial SBO and acute renal failure. Patient was made NPO and given IV hydration. Given bowel regimen that flushed out bowels. Patient was monitored until tolerating a regular diet and voiding on his on. Was instructed to follow-up with PCP for reassessment of his creatinine levels. Since discharge, patient endorses doing very well. Is eating a regular diet. Bowel movements are regular currently without tenesmus or blood. Is urinating well. Denies any new concerns.   EMR reviewed and hospitalization noted found in Care Everywhere and reviewed with the patient.   Past Medical History:  Diagnosis Date  . Appendicitis   . Bowel obstruction ([place])   . Gastric ulcer   . GERD (gastroesophageal reflux disease)   . History of kidney stones   . Hypertension    not on meds     No current outpatient medications on file prior to visit.   No current facility-administered medications on file prior to visit.     Allergies  Allergen Reactions  . Ampicillin Anaphylaxis  . Banana Anaphylaxis  . Latex Hives, Shortness Of Breath, Itching and Rash  . Morphine And Related Hives, Shortness Of Breath, Itching and Nausea And Vomiting    headache  . Other Anaphylaxis    Just pecans  . Penicillins Hives and Shortness Of Breath    Has patient had a PCN reaction causing immediate rash, facial/tongue/throat swelling, SOB or lightheadedness with hypotension: Yes Has patient had a PCN reaction causing severe rash involving mucus membranes or skin necrosis: No Has patient had a PCN reaction that required hospitalization No Has patient had a PCN reaction occurring within the last 10 years: No If all of the above answers are "NO", then may proceed with Cephalosporin use.   . Shrimp [Shellfish Allergy] Anaphylaxis    Just shrimp  . Hibiclens  [Chlorhexidine Gluconate] Itching    Stinging, burning     Family History  Problem Relation Age of Onset  . Healthy Mother   . Healthy Father   . Diabetes Paternal Control and instrumentation engineer  . Healthy Brother        x1  . Healthy Sister        x2  . Cancer Neg Hx   . Heart disease Neg Hx   . Hypercalcemia Neg Hx     Social History   Socioeconomic History  . Marital status: Single    Spouse name: Not on file  . Number of children: 0  . Years of education: Not on file  . Highest education level: Not on file  Occupational History  . Occupation: Buyer, retail: [employer]  . Occupation: Wellsite geologist    Comment: [place]  . Financial resource strain: Not on file  . Food insecurity:    Worry: Not on file    Inability: Not on file  . Transportation needs:    Medical: Not on file    Non-medical: Not on file  Tobacco Use  . Smoking status: Never Smoker  . Smokeless tobacco: Never Used  Substance and Sexual Activity  . Alcohol use: No    Alcohol/week: 0.0 oz  . Drug use: No  . Sexual activity: Not Currently  Lifestyle  . Physical activity:    Days per week: Not on file  Minutes per session: Not on file  . Stress: Not on file  Relationships  . Social connections:    Talks on phone: Not on file    Gets together: Not on file    Attends religious service: Not on file    Active member of club or organization: Not on file    Attends meetings of clubs or organizations: Not on file    Relationship status: Not on file  Other Topics Concern  . Not on file  Social History Narrative  . Not on file   Review of Systems - See HPI.  All other ROS are negative.  BP 130/82   Pulse 88   Temp 98.2 F (36.8 C) (Oral)   Resp 16   Ht 6' 2.5" (1.892 m)   Wt 206 lb (93.4 kg)   SpO2 98%   BMI 26.10 kg/m   Physical Exam  Constitutional: He appears well-developed and well-nourished.  HENT:  Head: Normocephalic and atraumatic.  Eyes:  Conjunctivae are normal.  Neck: Neck supple.  Cardiovascular: Normal rate, regular rhythm and normal heart sounds.  Pulmonary/Chest: Effort normal and breath sounds normal. No stridor. No respiratory distress. He has no wheezes. He has no rales. He exhibits no tenderness.  Abdominal: Soft. Bowel sounds are normal. He exhibits no distension and no mass. There is no tenderness. There is no rebound and no guarding.  Vitals reviewed.  Recent Results (from the past 2160 hour(s))  POCT urinalysis dipstick     Status: Normal   Collection Time: 05/11/17 10:00 AM  Result Value Ref Range   Color, UA yellow    Clarity, UA clear    Glucose, UA negative    Bilirubin, UA negative    Ketones, UA negative    Spec Grav, UA 1.015 1.010 - 1.025   Blood, UA negative    pH, UA 6.5 5.0 - 8.0   Protein, UA negative    Urobilinogen, UA 0.2 0.2 or 1.0 E.U./dL   Nitrite, UA negative    Leukocytes, UA Negative Negative   Appearance     Odor    CBC w/Diff     Status: Abnormal   Collection Time: 05/11/17 10:19 AM  Result Value Ref Range   WBC 6.4 4.0 - 10.5 K/uL   RBC 4.80 4.22 - 5.81 Mil/uL   Hemoglobin 14.5 13.0 - 17.0 g/dL   HCT 43.0 39.0 - 52.0 %   MCV 89.5 78.0 - 100.0 fl   MCHC 33.8 30.0 - 36.0 g/dL   RDW 13.7 11.5 - 15.5 %   Platelets 206.0 150.0 - 400.0 K/uL   Neutrophils Relative % 56.6 43.0 - 77.0 %   Lymphocytes Relative 29.0 12.0 - 46.0 %   Monocytes Relative 8.0 3.0 - 12.0 %   Eosinophils Relative 5.9 (H) 0.0 - 5.0 %   Basophils Relative 0.5 0.0 - 3.0 %   Neutro Abs 3.6 1.4 - 7.7 K/uL   Lymphs Abs 1.9 0.7 - 4.0 K/uL   Monocytes Absolute 0.5 0.1 - 1.0 K/uL   Eosinophils Absolute 0.4 0.0 - 0.7 K/uL   Basophils Absolute 0.0 0.0 - 0.1 K/uL  Comp Met (CMET)     Status: Abnormal   Collection Time: 05/11/17 10:19 AM  Result Value Ref Range   Sodium 137 135 - 145 mEq/L   Potassium 4.3 3.5 - 5.1 mEq/L   Chloride 103 96 - 112 mEq/L   CO2 27 19 - 32 mEq/L   Glucose, Bld 121 (H) 70 -  99  mg/dL   BUN 10 6 - 23 mg/dL   Creatinine, Ser 1.39 0.40 - 1.50 mg/dL   Total Bilirubin 0.5 0.2 - 1.2 mg/dL   Alkaline Phosphatase 47 39 - 117 U/L   AST 14 0 - 37 U/L   ALT 15 0 - 53 U/L   Total Protein 7.2 6.0 - 8.3 g/dL   Albumin 4.1 3.5 - 5.2 g/dL   Calcium 9.4 8.4 - 10.5 mg/dL   GFR 67.24 >60.00 mL/min  TSH     Status: None   Collection Time: 05/11/17 10:19 AM  Result Value Ref Range   TSH 2.79 0.35 - 4.50 uIU/mL  Sedimentation rate     Status: None   Collection Time: 05/11/17 10:19 AM  Result Value Ref Range   Sed Rate 7 0 - 20 mm/hr  PSA     Status: None   Collection Time: 05/11/17 10:19 AM  Result Value Ref Range   PSA 0.66 0.10 - 4.00 ng/mL    Comment: Test performed using Access Hybritech PSA Assay, a parmagnetic partical, chemiluminecent immunoassay.    Assessment/Plan: 1. SBO (small bowel obstruction) (HCC) Resolved. Passing stool and gas without issue. Bowel regimen reviewed with patient and wife. Strict return precautions discussed.  2. Acute renal failure, unspecified acute renal failure type ([place]) Creatinine at 1.6 on admission, had resolved down to baseline of 1.10 at discharge. Is trying to work on hydration. Will check BMP today.  - Basic metabolic panel  3. Benign prostatic hyperplasia with lower urinary tract symptoms, symptom details unspecified Noted on CT during admission. Normal PSA recently. Discussed medications. Patient declines. Would like to decrease caffeine intake first which is reasonable. Start saw palmetto supplement.    Leeanne Rio, PA-C

## 2017-07-13 DIAGNOSIS — R3912 Poor urinary stream: Secondary | ICD-10-CM | POA: Diagnosis not present

## 2017-07-13 DIAGNOSIS — R102 Pelvic and perineal pain: Secondary | ICD-10-CM | POA: Diagnosis not present

## 2017-07-13 DIAGNOSIS — N401 Enlarged prostate with lower urinary tract symptoms: Secondary | ICD-10-CM | POA: Diagnosis not present

## 2017-09-23 DIAGNOSIS — R3912 Poor urinary stream: Secondary | ICD-10-CM | POA: Diagnosis not present

## 2017-11-08 IMAGING — NM NM PARATHYROID W/ SPECT
3 series · 18 of 18 positions shown · non-contrast
Comparison: None.

CLINICAL DATA: Hyperparathyroidism.  PTH equal 109

EXAM:
NM PARATHYROID SCINTIGRAPHY AND SPECT IMAGING
TECHNIQUE: Following intravenous administration of radiopharmaceutical, early
and 2-hour delayed planar images were obtained in the anterior
projection. Delayed triplanar SPECT images were also obtained at 2
hours.
RADIOPHARMACEUTICALS:  25.4 mCi Pc-BBm Sestamibi IV

[Series 1: cor_cor · 4.1mm · 4.14mm/px · 6 of 128 frames shown]
[frame 11/128]
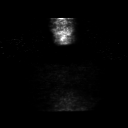
[frame 32/128]
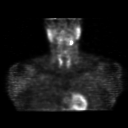
[frame 54/128]
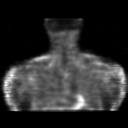
[frame 75/128]
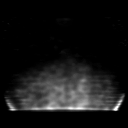
[frame 96/128]
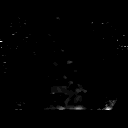
[frame 118/128]
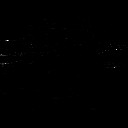

[Series 1: tra_tra · 4.1mm · 4.14mm/px · 6 of 128 frames shown]
[frame 11/128]
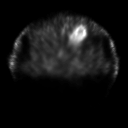
[frame 32/128]
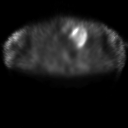
[frame 54/128]
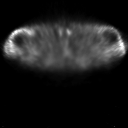
[frame 75/128]
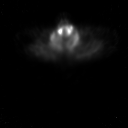
[frame 96/128]
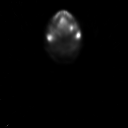
[frame 118/128]
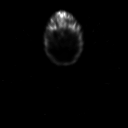

[Series 2: spect parathyroid · 4.14mm/px · 6 of 64 frames shown]
[frame 6/64  full-range]
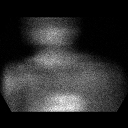
[frame 16/64  full-range]
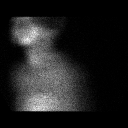
[frame 27/64  full-range]
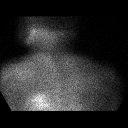
[frame 38/64  full-range]
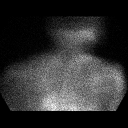
[frame 48/64  full-range]
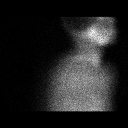
[frame 59/64  full-range]
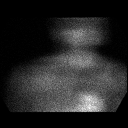

[18 of 18 positions shown; findings below may reference images not displayed]

FINDINGS: Initial planar imaging demonstrates no focal activity in the thyroid
bed at 2 hour imaging.

SPECT imaging of the neck and chest demonstrates retained activity
within the thyroid gland without focal lesion.

There is a focus intense radiotracer accumulation which localizes
along the LEFT submandibular location or within the mandible itself.
IMPRESSION: 1. No localization of parathyroid adenoma within the thyroid bed.
2. Intense focus of uptake in the LEFT submandibular region. This
could represent a reactive lymph node associated with periodontal
disease. Cannot exclude exclude neoplasm or infection. Consider CT
of the lower face / neck neck with contrast for further evaluation.

## 2017-12-28 DIAGNOSIS — N400 Enlarged prostate without lower urinary tract symptoms: Secondary | ICD-10-CM | POA: Diagnosis not present

## 2018-03-08 ENCOUNTER — Other Ambulatory Visit: Payer: Self-pay

## 2018-03-08 ENCOUNTER — Emergency Department (HOSPITAL_COMMUNITY): Payer: 59

## 2018-03-08 ENCOUNTER — Encounter (HOSPITAL_COMMUNITY): Payer: Self-pay

## 2018-03-08 ENCOUNTER — Inpatient Hospital Stay (HOSPITAL_COMMUNITY)
Admission: EM | Admit: 2018-03-08 | Discharge: 2018-03-12 | DRG: 389 | Disposition: A | Discharge status: Home / Self Care | Payer: 59 | Attending: Internal Medicine | Admitting: Internal Medicine

## 2018-03-08 ENCOUNTER — Inpatient Hospital Stay (HOSPITAL_COMMUNITY): Payer: 59

## 2018-03-08 DIAGNOSIS — K219 Gastro-esophageal reflux disease without esophagitis: Secondary | ICD-10-CM | POA: Diagnosis present

## 2018-03-08 DIAGNOSIS — N183 Chronic kidney disease, stage 3 unspecified: Secondary | ICD-10-CM | POA: Diagnosis present

## 2018-03-08 DIAGNOSIS — N400 Enlarged prostate without lower urinary tract symptoms: Secondary | ICD-10-CM | POA: Diagnosis present

## 2018-03-08 DIAGNOSIS — Z9089 Acquired absence of other organs: Secondary | ICD-10-CM | POA: Diagnosis not present

## 2018-03-08 DIAGNOSIS — Z8719 Personal history of other diseases of the digestive system: Secondary | ICD-10-CM

## 2018-03-08 DIAGNOSIS — E869 Volume depletion, unspecified: Secondary | ICD-10-CM | POA: Diagnosis present

## 2018-03-08 DIAGNOSIS — R112 Nausea with vomiting, unspecified: Secondary | ICD-10-CM | POA: Diagnosis not present

## 2018-03-08 DIAGNOSIS — Z885 Allergy status to narcotic agent status: Secondary | ICD-10-CM

## 2018-03-08 DIAGNOSIS — Z87442 Personal history of urinary calculi: Secondary | ICD-10-CM | POA: Diagnosis not present

## 2018-03-08 DIAGNOSIS — Z888 Allergy status to other drugs, medicaments and biological substances status: Secondary | ICD-10-CM

## 2018-03-08 DIAGNOSIS — D72829 Elevated white blood cell count, unspecified: Secondary | ICD-10-CM

## 2018-03-08 DIAGNOSIS — Z833 Family history of diabetes mellitus: Secondary | ICD-10-CM

## 2018-03-08 DIAGNOSIS — Z91040 Latex allergy status: Secondary | ICD-10-CM | POA: Diagnosis not present

## 2018-03-08 DIAGNOSIS — K5651 Intestinal adhesions [bands], with partial obstruction: Secondary | ICD-10-CM | POA: Diagnosis not present

## 2018-03-08 DIAGNOSIS — N200 Calculus of kidney: Secondary | ICD-10-CM | POA: Diagnosis present

## 2018-03-08 DIAGNOSIS — Z91013 Allergy to seafood: Secondary | ICD-10-CM

## 2018-03-08 DIAGNOSIS — Z0189 Encounter for other specified special examinations: Secondary | ICD-10-CM

## 2018-03-08 DIAGNOSIS — R55 Syncope and collapse: Secondary | ICD-10-CM | POA: Diagnosis not present

## 2018-03-08 DIAGNOSIS — Z9049 Acquired absence of other specified parts of digestive tract: Secondary | ICD-10-CM | POA: Diagnosis not present

## 2018-03-08 DIAGNOSIS — K56609 Unspecified intestinal obstruction, unspecified as to partial versus complete obstruction: Secondary | ICD-10-CM

## 2018-03-08 DIAGNOSIS — I129 Hypertensive chronic kidney disease with stage 1 through stage 4 chronic kidney disease, or unspecified chronic kidney disease: Secondary | ICD-10-CM | POA: Diagnosis present

## 2018-03-08 DIAGNOSIS — N179 Acute kidney failure, unspecified: Secondary | ICD-10-CM

## 2018-03-08 DIAGNOSIS — Z880 Allergy status to penicillin: Secondary | ICD-10-CM

## 2018-03-08 DIAGNOSIS — K5669 Other partial intestinal obstruction: Secondary | ICD-10-CM | POA: Diagnosis not present

## 2018-03-08 DIAGNOSIS — Z91018 Allergy to other foods: Secondary | ICD-10-CM

## 2018-03-08 DIAGNOSIS — Z8711 Personal history of peptic ulcer disease: Secondary | ICD-10-CM

## 2018-03-08 DIAGNOSIS — Z79899 Other long term (current) drug therapy: Secondary | ICD-10-CM | POA: Diagnosis not present

## 2018-03-08 DIAGNOSIS — E210 Primary hyperparathyroidism: Secondary | ICD-10-CM | POA: Diagnosis present

## 2018-03-08 DIAGNOSIS — Z09 Encounter for follow-up examination after completed treatment for conditions other than malignant neoplasm: Secondary | ICD-10-CM

## 2018-03-08 LAB — URINALYSIS, ROUTINE W REFLEX MICROSCOPIC
Bilirubin Urine: NEGATIVE
Glucose, UA: NEGATIVE mg/dL
Hgb urine dipstick: NEGATIVE
Ketones, ur: NEGATIVE mg/dL
Leukocytes, UA: NEGATIVE
NITRITE: NEGATIVE
PH: 5 (ref 5.0–8.0)
Protein, ur: 100 mg/dL — AB
SPECIFIC GRAVITY, URINE: 1.023 (ref 1.005–1.030)

## 2018-03-08 LAB — BASIC METABOLIC PANEL
Anion gap: 12 (ref 5–15)
BUN: 19 mg/dL (ref 6–20)
CALCIUM: 10.6 mg/dL — AB (ref 8.9–10.3)
CO2: 26 mmol/L (ref 22–32)
CREATININE: 1.63 mg/dL — AB (ref 0.61–1.24)
Chloride: 102 mmol/L (ref 98–111)
GFR calc non Af Amer: 45 mL/min — ABNORMAL LOW (ref >60)
GFR, EST AFRICAN AMERICAN: 53 mL/min — AB (ref >60)
Glucose, Bld: 135 mg/dL — ABNORMAL HIGH (ref 70–99)
Potassium: 4.4 mmol/L (ref 3.5–5.1)
SODIUM: 140 mmol/L (ref 135–145)

## 2018-03-08 LAB — CBC
HCT: 51.8 % (ref 39.0–52.0)
Hemoglobin: 16.8 g/dL (ref 13.0–17.0)
MCH: 29.8 pg (ref 26.0–34.0)
MCHC: 32.4 g/dL (ref 30.0–36.0)
MCV: 91.8 fL (ref 80.0–100.0)
NRBC: 0 % (ref 0.0–0.2)
Platelets: 246 10*3/uL (ref 150–400)
RBC: 5.64 MIL/uL (ref 4.22–5.81)
RDW: 12.9 % (ref 11.5–15.5)
WBC: 15.7 10*3/uL — AB (ref 4.0–10.5)

## 2018-03-08 LAB — CBG MONITORING, ED: GLUCOSE-CAPILLARY: 103 mg/dL — AB (ref 70–99)

## 2018-03-08 MED ORDER — ACETAMINOPHEN 650 MG RE SUPP: 650.0000 mg | Freq: Four times a day (QID) | RECTAL | Status: DC | PRN

## 2018-03-08 MED ORDER — DEXTROSE-NACL 5-0.9 % IV SOLN
INTRAVENOUS | Status: DC
  Administered 2018-03-08 – 2018-03-10 (×4): via INTRAVENOUS

## 2018-03-08 MED ORDER — ONDANSETRON HCL 4 MG/2ML IJ SOLN: 4.0000 mg | Freq: Four times a day (QID) | INTRAMUSCULAR | Status: DC | PRN

## 2018-03-08 MED ORDER — DIATRIZOATE MEGLUMINE & SODIUM 66-10 % PO SOLN
90.0000 mL | Freq: Once | ORAL | Status: AC
  Administered 2018-03-08: 90 mL via NASOGASTRIC
  Filled 2018-03-08: qty 90

## 2018-03-08 MED ORDER — ONDANSETRON HCL 4 MG/2ML IJ SOLN
4.0000 mg | Freq: Once | INTRAMUSCULAR | Status: AC
  Administered 2018-03-08: 4 mg via INTRAVENOUS
  Filled 2018-03-08: qty 2

## 2018-03-08 MED ORDER — FAMOTIDINE IN NACL 20-0.9 MG/50ML-% IV SOLN
20.0000 mg | INTRAVENOUS | Status: AC
  Administered 2018-03-08 – 2018-03-11 (×4): 20 mg via INTRAVENOUS
  Filled 2018-03-08 (×4): qty 50

## 2018-03-08 MED ORDER — ENOXAPARIN SODIUM 40 MG/0.4ML ~~LOC~~ SOLN
40.0000 mg | SUBCUTANEOUS | Status: DC
  Administered 2018-03-08: 40 mg via SUBCUTANEOUS
  Filled 2018-03-08: qty 0.4

## 2018-03-08 MED ORDER — PHENOL 1.4 % MT LIQD
1.0000 | OROMUCOSAL | Status: DC | PRN
  Filled 2018-03-08: qty 177

## 2018-03-08 MED ORDER — HYDROMORPHONE HCL 1 MG/ML IJ SOLN
0.5000 mg | INTRAMUSCULAR | Status: DC | PRN
  Administered 2018-03-08 – 2018-03-11 (×6): 0.5 mg via INTRAVENOUS
  Filled 2018-03-08 (×7): qty 0.5

## 2018-03-08 MED ORDER — SODIUM CHLORIDE 0.9 % IV BOLUS
1000.0000 mL | Freq: Once | INTRAVENOUS | Status: AC
  Administered 2018-03-08: 1000 mL via INTRAVENOUS

## 2018-03-08 MED ORDER — ONDANSETRON HCL 4 MG PO TABS: 4.0000 mg | ORAL_TABLET | Freq: Four times a day (QID) | ORAL | Status: DC | PRN

## 2018-03-08 MED ORDER — ACETAMINOPHEN 325 MG PO TABS: 650.0000 mg | ORAL_TABLET | Freq: Four times a day (QID) | ORAL | Status: DC | PRN

## 2018-03-08 MED ORDER — IOHEXOL 300 MG/ML  SOLN
30.0000 mL | Freq: Once | INTRAMUSCULAR | Status: AC | PRN
  Administered 2018-03-08: 30 mL via ORAL

## 2018-03-08 MED ORDER — SODIUM CHLORIDE 0.9 % IV BOLUS
1000.0000 mL | Freq: Once | INTRAVENOUS | Status: AC
  Administered 2018-03-09: 1000 mL via INTRAVENOUS

## 2018-03-08 NOTE — ED Notes (Signed)
Bed: WA07 Expected date:  Expected time:  Means of arrival:  Comments: EMS 57yo syncope x 3, 2 witnessed with EMS

## 2018-03-08 NOTE — Progress Notes (Signed)
Call made to Radiology to make them aware that Gastrograffin has been given via NG tube. Spoke with Wilkie Aye and she will have patient sent for in eight hours for X-Rays. Lina Sar, RN

## 2018-03-08 NOTE — H&P (Signed)
History and Physical    Gregory Sweeney:[id number] DOB: [birth year] DOA: 03/08/2018  PCP: Waldon Merl, PA-C   Patient coming from: Home   Chief Complaint: Abdominal pain, nausea and vomiting.   HPI: Gregory Sweeney is a 58 y.o. male with medical history significant of multiple abdominal surgeries including appendectomy, cholecystectomy and a perforated ulcer repair.  Patient was at his usual state of health until yesterday late afternoon when he ate chicken wings, for late lunch, immediately after that he experienced abdominal pain, moderate to severe in intensity, colicky in nature, with no improving or worsening factors, associated with nausea, vomiting, and abdominal distention, he did notice he was not able to pass gas and he has not had any bowel movements since yesterday.  He had bowel obstructions in the past.  He continued to have symptoms overnight, early this morning while he was trying to get into the restroom he had a syncope episode preceded by dizziness, lightheadedness and diaphoresis.  EMS was called and he had another syncope episode while in the ambulance.   ED Course: Patient was noted to have significant abdominal distention, further work-up with CT of the abdomen revealed bowel obstruction, NG tube was placed, he received IV fluids and surgery was consulted.  Recommendations for conservative medical therapy.  Review of Systems:  1. General: No fevers, no chills, no weight gain or weight loss 2. ENT: No runny nose or sore throat, no hearing disturbances 3. Pulmonary: No dyspnea, cough, wheezing, or hemoptysis 4. Cardiovascular: No angina, claudication, lower extremity edema, pnd or orthopnea 5. Gastrointestinal: Positive nausea and vomiting, no diarrhea. No flatus or bowel movement 6. Hematology: No easy bruisability or frequent infections 7. Urology: No dysuria, hematuria or increased urinary frequency 8. Dermatology: No rashes. 9. Neurology: No seizures or  paresthesias 10. Musculoskeletal: No joint pain or deformities  Past Medical History:  Diagnosis Date  . Appendicitis   . Bowel obstruction (HCC)   . Gastric ulcer   . GERD (gastroesophageal reflux disease)   . History of kidney stones   . Hypertension    not on meds     Past Surgical History:  Procedure Laterality Date  . APPENDECTOMY    . BOWEL OBSTRUCTION    . CHOLECYSTECTOMY    . ESOPHAGEAL DILATION    . ESOPHAGOGASTRODUODENOSCOPY (EGD) WITH PROPOFOL N/A 07/21/2013   Procedure: ESOPHAGOGASTRODUODENOSCOPY (EGD) WITH PROPOFOL;  Surgeon: Theda Belfast, MD;  Location: [facility];  Service: Endoscopy;  Laterality: N/A;  . KIDNEY STONE SURGERY    . PARATHYROIDECTOMY N/A 12/30/2015   Procedure: NECK EXPLORATION AND PARATHYROIDECTOMY;  Surgeon: Darnell Level, MD;  Location: [facility];  Service: General;  Laterality: N/A;     reports that he has never smoked. He has never used smokeless tobacco. He reports that he does not drink alcohol or use drugs.  Allergies  Allergen Reactions  . Ampicillin Anaphylaxis    DID THE REACTION INVOLVE: Swelling of the face/tongue/throat, SOB, or low BP? Yes Sudden or severe rash/hives, skin peeling, or the inside of the mouth or nose? Yes Did it require medical treatment? Yes When did it last happen?less than 10 yrs If all above answers are "NO", may proceed with cephalosporin use.   . Banana Anaphylaxis  . Latex Hives, Shortness Of Breath, Itching and Rash  . Morphine And Related Hives, Shortness Of Breath, Itching and Nausea And Vomiting    headache  . Other Anaphylaxis    Just pecans  . Penicillins Hives and  Shortness Of Breath    Has patient had a PCN reaction causing immediate rash, facial/tongue/throat swelling, SOB or lightheadedness with hypotension: Yes Has patient had a PCN reaction causing severe rash involving mucus membranes or skin necrosis: No Has patient had a PCN reaction that required hospitalization No Has patient had a  PCN reaction occurring within the last 10 years: No If all of the above answers are "NO", then may proceed with Cephalosporin use.   . Shrimp [Shellfish Allergy] Anaphylaxis    Just shrimp  . Hibiclens [Chlorhexidine Gluconate] Itching    Stinging, burning     Family History  Problem Relation Age of Onset  . Healthy Mother   . Healthy Father   . Diabetes Paternal Recruitment consultantAunt        Great Aunt  . Healthy Brother        x1  . Healthy Sister        x2  . Cancer Neg Hx   . Heart disease Neg Hx   . Hypercalcemia Neg Hx      Prior to Admission medications   Medication Sig Start Date End Date Taking? Authorizing Provider  calcium carbonate (TUMS - DOSED IN MG ELEMENTAL CALCIUM) 500 MG chewable tablet Chew 2 tablets by mouth every 4 (four) hours as needed for indigestion or heartburn (pain).   Yes [provider]    Physical Exam: Vitals:   03/08/18 1200 03/08/18 1230 03/08/18 1419 03/08/18 1430  BP: (!) 145/100 140/78 (!) 163/100 (!) 128/93  Pulse:  89 97 97  Resp: 20 (!) 24 15 20   Temp:      SpO2:  99% 99% 96%    Vitals:   03/08/18 1200 03/08/18 1230 03/08/18 1419 03/08/18 1430  BP: (!) 145/100 140/78 (!) 163/100 (!) 128/93  Pulse:  89 97 97  Resp: 20 (!) 24 15 20   Temp:      SpO2:  99% 99% 96%    General: Deconditioned Neurology: Awake and alert, non focal Head and Neck. Head normocephalic. Neck supple with no adenopathy or thyromegaly.   E ENT: no pallor, no icterus, oral mucosa dry. NG tube in place. Cardiovascular: No JVD. S1-S2 present, rhythmic, no gallops, rubs, or murmurs. No lower extremity edema. Pulmonary: vesicular breath sounds bilaterally, adequate air movement, no wheezing, rhonchi or rales. Gastrointestinal. Abdomen distended, tender to deep palpation, tympanic to percussion, decreased bowel sounds, no organomegaly, non tender, no rebound or guarding Skin. No rashes Musculoskeletal: no joint deformities    Labs on Admission: I have personally  reviewed following labs and imaging studies  CBC: Recent Labs  Lab 03/08/18 1025  WBC 15.7*  HGB 16.8  HCT 51.8  MCV 91.8  PLT 246   Basic Metabolic Panel: Recent Labs  Lab 03/08/18 1025  NA 140  K 4.4  CL 102  CO2 26  GLUCOSE 135*  BUN 19  CREATININE 1.63*  CALCIUM 10.6*   GFR: CrCl cannot be calculated (Unknown ideal weight.). Liver Function Tests: No results for input(s): AST, ALT, ALKPHOS, BILITOT, PROT, ALBUMIN in the last 168 hours. No results for input(s): LIPASE, AMYLASE in the last 168 hours. No results for input(s): AMMONIA in the last 168 hours. Coagulation Profile: No results for input(s): INR, PROTIME in the last 168 hours. Cardiac Enzymes: No results for input(s): CKTOTAL, CKMB, CKMBINDEX, TROPONINI in the last 168 hours. BNP (last 3 results) No results for input(s): PROBNP in the last 8760 hours. HbA1C: No results for input(s): HGBA1C in the last  72 hours. CBG: Recent Labs  Lab 03/08/18 1037  GLUCAP 103*   Lipid Profile: No results for input(s): CHOL, HDL, LDLCALC, TRIG, CHOLHDL, LDLDIRECT in the last 72 hours. Thyroid Function Tests: No results for input(s): TSH, T4TOTAL, FREET4, T3FREE, THYROIDAB in the last 72 hours. Anemia Panel: No results for input(s): VITAMINB12, FOLATE, FERRITIN, TIBC, IRON, RETICCTPCT in the last 72 hours. Urine analysis:    Component Value Date/Time   COLORURINE YELLOW 03/08/2018 1025   APPEARANCEUR CLOUDY (A) 03/08/2018 1025   LABSPEC 1.023 03/08/2018 1025   PHURINE 5.0 03/08/2018 1025   GLUCOSEU NEGATIVE 03/08/2018 1025   GLUCOSEU NEGATIVE 06/25/2015 0724   HGBUR NEGATIVE 03/08/2018 1025   BILIRUBINUR NEGATIVE 03/08/2018 1025   BILIRUBINUR negative 05/11/2017 1000   KETONESUR NEGATIVE 03/08/2018 1025   PROTEINUR 100 (A) 03/08/2018 1025   UROBILINOGEN 0.2 05/11/2017 1000   UROBILINOGEN 0.2 06/25/2015 0724   NITRITE NEGATIVE 03/08/2018 1025   LEUKOCYTESUR NEGATIVE 03/08/2018 1025    Radiological Exams on  Admission: Ct Abdomen Pelvis Wo Contrast  Result Date: 03/08/2018 CLINICAL DATA:  Vomiting. Syncopal episode. EXAM: CT ABDOMEN AND PELVIS WITHOUT CONTRAST TECHNIQUE: Multidetector CT imaging of the abdomen and pelvis was performed following the standard protocol without IV contrast. COMPARISON:  05/27/2017 FINDINGS: Lower chest: Basilar pulmonary scarring. No consolidation or collapse. No effusion. Hepatobiliary: Previous cholecystectomy. Liver parenchyma is normal. Pancreas: Normal Spleen: Normal Adrenals/Urinary Tract: Adrenal glands are normal. Kidneys are normal except for some small nonobstructing stones in the lower pole on the left and a left renal cyst. Chronic bladder wall thickening. Stomach/Bowel: Acute small bowel obstruction. Marked dilated fluid and air-filled proximal small intestine. The distal small intestine is collapsed. Obstruction level is probably proximal to mid ileal. Colon appears unremarkable. Vascular/Lymphatic: Normal Reproductive: Normal Other: No free fluid or air. Musculoskeletal: Ordinary lower lumbar degenerative changes. IMPRESSION: Acute small bowel obstruction, probably proximal to mid ileal. Distal small intestine is collapsed. Nonobstructing renal calculi in the lower pole of the left kidney. Chronic thick-walled bladder. Electronically Signed   By: Paulina Fusi M.D.   On: 03/08/2018 15:16    EKG: Independently reviewed.  EKG normal sinus rhythm, normal axis, normal intervals.  Assessment/Plan Active Problems:   SBO (small bowel obstruction) (HCC)   58 year old male who presents with acute onset of abdominal distention, associated with nausea, vomiting, inability to move his bowels or pass gas.  He had history of multiple abdominal surgeries in the past and prior history of bowel obstruction.  Describes about 2 episodes of vasovagal syncope, loss of consciousness preceded by dizziness, lightheadedness and diaphoresis in the setting of acute bowel obstruction.  On  his initial physical examination his blood pressure is 137/87, heart rate 113, respiratory rate 17, oxygen saturation 100% on room air, he has dry mucous membranes, his lungs are clear to auscultation bilaterally, heart S1-S2 present and rhythmic, abdomen is distended, tender to deep palpation, tympanic to percussion, decreased bowel sounds, lower extremities no edema.  Sodium 140, potassium 4.4, chloride 102, bicarb 26, glucose 135, creatinine 1.63, white count 15.7, hemoglobin 16.8, hematocrit 51.8, platelets 246, urine analysis with no infection, 100 protein.  CT of the abdomen with acute small bowel obstruction probably proximal to mid ileum.  Patient will be admitted to the hospital with a working diagnosis of acute small bowel obstruction complicated by acute kidney injury.  1.  Acute small bowel obstruction.  Likely due to adhesions related to prior abdominal surgeries.  We will continue supportive medical therapy with IV  fluids, IV antiacids, as needed IV analgesics and antiemetics.  NG tube to suction, and follow-up small bowel protocol per surgery recommendations.  Keep patient nothing by mouth.  2.  Acute kidney injury on chronic kidney disease stage III.  His baseline creatinine is 1.2, calculated GFR 79.  Currently likely prerenal acute kidney injury, continue hydration with isotonic saline at 100 mL per hour, avoid hypotension and nephrotoxic agents, follow-up kidney function the morning.  3.  Reactive leukocytosis.  No signs of systemic infection, continue to hold on antibiotic therapy, follow-up cell count in the morning.  4.  Vasovagal syncope.  Patient describes typical vasovagal syncope with prodromal symptoms.  Likely related to acute bowel obstruction, will continue treatment for acute bowel obstruction, continue hydration, neurochecks per unit protocol.  Currently will hold on further telemetry monitoring, his EKG has normal sinus rhythm  DVT prophylaxis: enoxaparin  Code Status:   full  Family Communication: I spoke with patient's family at the bedside and all questions were addressed.   Disposition Plan: Pending clinical improvement Consults called: Surgery Admission status: Medical ward   Kihanna Kamiya Annett Gula MD [facility] Pager [phone]  If 7PM-7AM, please contact night-coverage [email or web address] Password [email or web address]  03/08/2018, 4:10 PM

## 2018-03-08 NOTE — ED Provider Notes (Signed)
[facility] WEST GENERAL SURGERY Provider Note   CSN: [id number] Arrival date & time: 03/08/18  1009     History   Chief Complaint No chief complaint on file.   HPI Gregory Sweeney is a 58 y.o. male.  HPI  58 year old male with history of enthesitis, cholecystectomy and peptic ulcer disease comes in with chief complaint of syncope. Patient reports that he started having some abdominal pain yesterday.  The pain is described as cramping pain that was intermittent.  This morning the pain got intense and has been more persistent.  At its worst the pain is 10 out of 10, however right now patient is having mild to moderate pain only.  It appears that he was brought to the ER because of a syncopal spell.  I spoke with the brother-in-law who called 911.  He stated that patient started asking for help because of severe abdominal pain.  When he went to check on the patient he was sweating profusely and breathing heavily.  He had the patient sit on the floor, and soon after patient became unresponsive.  The episode of unresponsiveness lasted for 15 to 20 minutes.  According to EMS in route to the ER patient had another brief episode of unresponsive spell.  Patient denies any cardiac history.  He does not recall any of the events that were described by EMS or patient's brother-in-law.  He has no history of seizures and no one has witnessed seizure-like activity.  Past Medical History:  Diagnosis Date  . Appendicitis   . Bowel obstruction (HCC)   . Gastric ulcer   . GERD (gastroesophageal reflux disease)   . History of kidney stones   . Hypertension    not on meds     Patient Active Problem List   Diagnosis Date Noted  . SBO (small bowel obstruction) (HCC) 06/14/2017  . ARF (acute renal failure) (HCC) 06/14/2017  . Hyperparathyroidism, primary (HCC) 12/30/2015  . Hyperparathyroidism (HCC) 08/02/2015  . Visit for preventive health examination 06/12/2015  . Prostate  cancer screening 06/12/2015  . Essential hypertension 10/21/2014    Past Surgical History:  Procedure Laterality Date  . APPENDECTOMY    . BOWEL OBSTRUCTION    . CHOLECYSTECTOMY    . ESOPHAGEAL DILATION    . ESOPHAGOGASTRODUODENOSCOPY (EGD) WITH PROPOFOL N/A 07/21/2013   Procedure: ESOPHAGOGASTRODUODENOSCOPY (EGD) WITH PROPOFOL;  Surgeon: Theda BelfastPatrick D Hung, MD;  Location: [facility];  Service: Endoscopy;  Laterality: N/A;  . KIDNEY STONE SURGERY    . PARATHYROIDECTOMY N/A 12/30/2015   Procedure: NECK EXPLORATION AND PARATHYROIDECTOMY;  Surgeon: Darnell Levelodd Gerkin, MD;  Location: [facility];  Service: General;  Laterality: N/A;        Home Medications    Prior to Admission medications   Medication Sig Start Date End Date Taking? Authorizing Provider  calcium carbonate (TUMS - DOSED IN MG ELEMENTAL CALCIUM) 500 MG chewable tablet Chew 2 tablets by mouth every 4 (four) hours as needed for indigestion or heartburn (pain).   Yes [provider]    Family History Family History  Problem Relation Age of Onset  . Healthy Mother   . Healthy Father   . Diabetes Paternal Recruitment consultantAunt        Great Aunt  . Healthy Brother        x1  . Healthy Sister        x2  . Cancer Neg Hx   . Heart disease Neg Hx   . Hypercalcemia Neg Hx  Social History Social History   Tobacco Use  . Smoking status: Never Smoker  . Smokeless tobacco: Never Used  Substance Use Topics  . Alcohol use: No    Alcohol/week: 0.0 standard drinks  . Drug use: No     Allergies   Ampicillin; Banana; Latex; Morphine and related; Other; Penicillins; Shrimp [shellfish allergy]; and Hibiclens [chlorhexidine gluconate]   Review of Systems Review of Systems  Constitutional: Positive for activity change.  Respiratory: Negative for shortness of breath.   Cardiovascular: Negative for chest pain.  Gastrointestinal: Positive for abdominal distention and nausea.  Neurological: Positive for syncope. Negative for dizziness.    All other systems reviewed and are negative.    Physical Exam Updated Vital Signs BP 137/87 (BP Location: Left Arm)   Pulse (!) 113   Temp 99.3 F (37.4 C) (Oral)   Resp 17   SpO2 100%   Physical Exam Vitals signs and nursing note reviewed.  Constitutional:      Appearance: He is well-developed.  HENT:     Head: Atraumatic.  Neck:     Musculoskeletal: Neck supple.  Cardiovascular:     Rate and Rhythm: Normal rate.  Pulmonary:     Effort: Pulmonary effort is normal.  Abdominal:     General: There is distension.     Tenderness: There is abdominal tenderness.  Skin:    General: Skin is warm.  Neurological:     Mental Status: He is alert and oriented to person, place, and time.      ED Treatments / Results  Labs (all labs ordered are listed, but only abnormal results are displayed) Labs Reviewed  BASIC METABOLIC PANEL - Abnormal; Notable for the following components:      Result Value   Glucose, Bld 135 (*)    Creatinine, Ser 1.63 (*)    Calcium 10.6 (*)    GFR calc non Af Amer 45 (*)    GFR calc Af Amer 53 (*)    All other components within normal limits  CBC - Abnormal; Notable for the following components:   WBC 15.7 (*)    All other components within normal limits  URINALYSIS, ROUTINE W REFLEX MICROSCOPIC - Abnormal; Notable for the following components:   APPearance CLOUDY (*)    Protein, ur 100 (*)    Bacteria, UA FEW (*)    All other components within normal limits  CBG MONITORING, ED - Abnormal; Notable for the following components:   Glucose-Capillary 103 (*)    All other components within normal limits  HIV ANTIBODY (ROUTINE TESTING W REFLEX)  CBC  CREATININE, SERUM  BASIC METABOLIC PANEL  CBC    EKG EKG Interpretation  Date/Time:  Tuesday March 08 2018 10:29:53 EST Ventricular Rate:  94 PR Interval:    QRS Duration: 91 QT Interval:  353 QTC Calculation: 442 R Axis:   74 Text Interpretation:  Sinus rhythm Abnormal inferior Q waves  No acute changes No significant change since last tracing Confirmed by Derwood [name] ([id number]) on 03/08/2018 10:43:22 AM   Radiology Ct Abdomen Pelvis Wo Contrast  Result Date: 03/08/2018 CLINICAL DATA:  Vomiting. Syncopal episode. EXAM: CT ABDOMEN AND PELVIS WITHOUT CONTRAST TECHNIQUE: Multidetector CT imaging of the abdomen and pelvis was performed following the standard protocol without IV contrast. COMPARISON:  05/27/2017 FINDINGS: Lower chest: Basilar pulmonary scarring. No consolidation or collapse. No effusion. Hepatobiliary: Previous cholecystectomy. Liver parenchyma is normal. Pancreas: Normal Spleen: Normal Adrenals/Urinary Tract: Adrenal glands are normal. Kidneys are normal except  for some small nonobstructing stones in the lower pole on the left and a left renal cyst. Chronic bladder wall thickening. Stomach/Bowel: Acute small bowel obstruction. Marked dilated fluid and air-filled proximal small intestine. The distal small intestine is collapsed. Obstruction level is probably proximal to mid ileal. Colon appears unremarkable. Vascular/Lymphatic: Normal Reproductive: Normal Other: No free fluid or air. Musculoskeletal: Ordinary lower lumbar degenerative changes. IMPRESSION: Acute small bowel obstruction, probably proximal to mid ileal. Distal small intestine is collapsed. Nonobstructing renal calculi in the lower pole of the left kidney. Chronic thick-walled bladder. Electronically Signed   By: Paulina Fusi M.D.   On: 03/08/2018 15:16    Procedures Procedures (including critical care time)  Medications Ordered in ED Medications  sodium chloride 0.9 % bolus 1,000 mL (has no administration in time range)  enoxaparin (LOVENOX) injection 40 mg (has no administration in time range)  dextrose 5 %-0.9 % sodium chloride infusion (has no administration in time range)  acetaminophen (TYLENOL) tablet 650 mg (has no administration in time range)    Or  acetaminophen (TYLENOL) suppository 650 mg  (has no administration in time range)  ondansetron (ZOFRAN) tablet 4 mg (has no administration in time range)    Or  ondansetron (ZOFRAN) injection 4 mg (has no administration in time range)  diatrizoate meglumine-sodium (GASTROGRAFIN) 66-10 % solution 90 mL (has no administration in time range)  sodium chloride 0.9 % bolus 1,000 mL (1,000 mLs Intravenous New Bag/Given 03/08/18 1221)  ondansetron (ZOFRAN) injection 4 mg (4 mg Intravenous Given 03/08/18 1221)  iohexol (OMNIPAQUE) 300 MG/ML solution 30 mL (30 mLs Oral Contrast Given 03/08/18 1304)     Initial Impression / Assessment and Plan / ED Course  I have reviewed the triage vital signs and the nursing notes.  Pertinent labs & imaging results that were available during my care of the patient were reviewed by me and considered in my medical decision making (see chart for details).     58 year old male with multiple abdominal surgeries comes in with chief complaint of nausea and abdominal pain. Clinical suspicion is high for small bowel obstruction.  CT scan ordered and it does show evidence of small bowel obstruction.  General surgery has been consulted, they requested medicine admission.  Because of syncope type event and elevated creatinine.  Patient had 2 episodes of unresponsiveness -it is unclear whether patient actually had a syncopal episode.  If he did have syncopal episode, it seems like it was a vasovagal type event given that he he was diaphoretic and nauseated prior to the episode.  There was no seizure-like activity witnessed and suspicion is low for seizure.  Patient has no cardiac history.  For syncope our plan is to monitor patient on telemetry for 4 hours in the ED.   Final Clinical Impressions(s) / ED Diagnoses   Final diagnoses:  SBO (small bowel obstruction) [facility])    ED Discharge Orders    None       Derwood [name], MD 03/08/18 1709

## 2018-03-08 NOTE — Consult Note (Signed)
Reason for Consult: syncope/SBO Referring Physician: Dr. Lavetta NielsenA Nanavati  Gregory Sweeney is an 58 y.o. male.    HPI: pt with prior hx of what sounds like a perforated ulcer at age 58, he had  cholecystectomy, appendectomy, and a bowel obstruction with the above surgery.  He has had no issues since that time.      Pt notes he ate some chicken last night and then started having some abdominal pain later that evening.  Today he started having nausea and vomiting this AM,  with 2 episodes of syncope.  The first episode was in the bathroom, he described being sweaty, a friend had him lie down on the floor and then he passed out for about 20 minutes.  He did not fall and did not hit his head.  EMS and the fire department came and he was reported hypotensive, and had a second episode of syncope while he was with the EMS.    Work up in the ED shows he is afebrile, BP up some.  Labs show some acute kidney injury  with creatinine of 1.63, glucose of 135, WBC of 15.7.  UA is unremarkable.  CT scan shows acute SBO, with dilated fluid/air levels proximal SB.  The distal small intestine is collapsed. Obstruction level is probably proximal to mid ileal.  Colon appears unremarkable. SBO probably in the proximal to mid ileal small bowel.  There is also an non obstruction renal calculi in the left kidney, lower pole.  We are ask to see.  Past Medical History:  Diagnosis Date  . Appendicitis   . Bowel obstruction (HCC)   . Gastric ulcer   . GERD (gastroesophageal reflux disease)   . History of kidney stones   . Hypertension    not on meds     Past Surgical History:  Procedure Laterality Date  . APPENDECTOMY    . BOWEL OBSTRUCTION    . CHOLECYSTECTOMY    . ESOPHAGEAL DILATION    . ESOPHAGOGASTRODUODENOSCOPY (EGD) WITH PROPOFOL N/A 07/21/2013   Procedure: ESOPHAGOGASTRODUODENOSCOPY (EGD) WITH PROPOFOL;  Surgeon: Theda BelfastPatrick D Hung, MD;  Location: [facility];  Service: Endoscopy;  Laterality: N/A;  . KIDNEY  STONE SURGERY    . PARATHYROIDECTOMY N/A 12/30/2015   Procedure: NECK EXPLORATION AND PARATHYROIDECTOMY;  Surgeon: Darnell Levelodd Gerkin, MD;  Location: [facility];  Service: General;  Laterality: N/A;    Family History  Problem Relation Age of Onset  . Healthy Mother   . Healthy Father   . Diabetes Paternal Recruitment consultantAunt        Great Aunt  . Healthy Brother        x1  . Healthy Sister        x2  . Cancer Neg Hx   . Heart disease Neg Hx   . Hypercalcemia Neg Hx     Social History:  reports that he has never smoked. He has never used smokeless tobacco. He reports that he does not drink alcohol or use drugs.  Allergies:  Allergies  Allergen Reactions  . Ampicillin Anaphylaxis    DID THE REACTION INVOLVE: Swelling of the face/tongue/throat, SOB, or low BP? Yes Sudden or severe rash/hives, skin peeling, or the inside of the mouth or nose? Yes Did it require medical treatment? Yes When did it last happen?less than 10 yrs If all above answers are "NO", may proceed with cephalosporin use.   . Banana Anaphylaxis  . Latex Hives, Shortness Of Breath, Itching and Rash  . Morphine And Related  Hives, Shortness Of Breath, Itching and Nausea And Vomiting    headache  . Other Anaphylaxis    Just pecans  . Penicillins Hives and Shortness Of Breath    Has patient had a PCN reaction causing immediate rash, facial/tongue/throat swelling, SOB or lightheadedness with hypotension: Yes Has patient had a PCN reaction causing severe rash involving mucus membranes or skin necrosis: No Has patient had a PCN reaction that required hospitalization No Has patient had a PCN reaction occurring within the last 10 years: No If all of the above answers are "NO", then may proceed with Cephalosporin use.   . Shrimp [Shellfish Allergy] Anaphylaxis    Just shrimp  . Hibiclens [Chlorhexidine Gluconate] Itching    Stinging, burning     Prior to Admission medications   Medication Sig Start Date End Date Taking? Authorizing  Provider  calcium carbonate (TUMS - DOSED IN MG ELEMENTAL CALCIUM) 500 MG chewable tablet Chew 2 tablets by mouth every 4 (four) hours as needed for indigestion or heartburn (pain).   Yes [provider]     Results for orders placed or performed during the hospital encounter of 03/08/18 (from the past 48 hour(s))  Basic metabolic panel     Status: Abnormal   Collection Time: 03/08/18 10:25 AM  Result Value Ref Range   Sodium 140 135 - 145 mmol/L   Potassium 4.4 3.5 - 5.1 mmol/L   Chloride 102 98 - 111 mmol/L   CO2 26 22 - 32 mmol/L   Glucose, Bld 135 (H) 70 - 99 mg/dL   BUN 19 6 - 20 mg/dL   Creatinine, Ser 7.821.63 (H) 0.61 - 1.24 mg/dL   Calcium 95.610.6 (H) 8.9 - 10.3 mg/dL   GFR calc non Af Amer 45 (L) >60 mL/min   GFR calc Af Amer 53 (L) >60 mL/min   Anion gap 12 5 - 15    Comment: Performed at [facility], [address] [place], [place] [address]  CBC     Status: Abnormal   Collection Time: 03/08/18 10:25 AM  Result Value Ref Range   WBC 15.7 (H) 4.0 - 10.5 K/uL   RBC 5.64 4.22 - 5.81 MIL/uL   Hemoglobin 16.8 13.0 - 17.0 g/dL   HCT 65.751.8 84.639.0 - 96.252.0 %   MCV 91.8 80.0 - 100.0 fL   MCH 29.8 26.0 - 34.0 pg   MCHC 32.4 30.0 - 36.0 g/dL   RDW 95.212.9 84.111.5 - 32.415.5 %   Platelets 246 150 - 400 K/uL   nRBC 0.0 0.0 - 0.2 %    Comment: Performed at [facility], [address] [place], [place] [address]  Urinalysis, Routine w reflex microscopic     Status: Abnormal   Collection Time: 03/08/18 10:25 AM  Result Value Ref Range   Color, Urine YELLOW YELLOW   APPearance CLOUDY (A) CLEAR   Specific Gravity, Urine 1.023 1.005 - 1.030   pH 5.0 5.0 - 8.0   Glucose, UA NEGATIVE NEGATIVE mg/dL   Hgb urine dipstick NEGATIVE NEGATIVE   Bilirubin Urine NEGATIVE NEGATIVE   Ketones, ur NEGATIVE NEGATIVE mg/dL   Protein, ur 725100 (A) NEGATIVE mg/dL   Nitrite NEGATIVE NEGATIVE   Leukocytes, UA NEGATIVE NEGATIVE   RBC / HPF 0-5 0 - 5 RBC/hpf   WBC, UA  0-5 0 - 5 WBC/hpf   Bacteria, UA FEW (A) NONE SEEN   Squamous Epithelial / LPF 0-5 0 - 5   Mucus PRESENT  Hyaline Casts, UA PRESENT    Ca Oxalate Crys, UA PRESENT     Comment: Performed at [facility], [address], [place], [place] [address]  CBG monitoring, ED     Status: Abnormal   Collection Time: 03/08/18 10:37 AM  Result Value Ref Range   Glucose-Capillary 103 (H) 70 - 99 mg/dL    Ct Abdomen Pelvis Wo Contrast  Result Date: 03/08/2018 CLINICAL DATA:  Vomiting. Syncopal episode. EXAM: CT ABDOMEN AND PELVIS WITHOUT CONTRAST TECHNIQUE: Multidetector CT imaging of the abdomen and pelvis was performed following the standard protocol without IV contrast. COMPARISON:  05/27/2017 FINDINGS: Lower chest: Basilar pulmonary scarring. No consolidation or collapse. No effusion. Hepatobiliary: Previous cholecystectomy. Liver parenchyma is normal. Pancreas: Normal Spleen: Normal Adrenals/Urinary Tract: Adrenal glands are normal. Kidneys are normal except for some small nonobstructing stones in the lower pole on the left and a left renal cyst. Chronic bladder wall thickening. Stomach/Bowel: Acute small bowel obstruction. Marked dilated fluid and air-filled proximal small intestine. The distal small intestine is collapsed. Obstruction level is probably proximal to mid ileal. Colon appears unremarkable. Vascular/Lymphatic: Normal Reproductive: Normal Other: No free fluid or air. Musculoskeletal: Ordinary lower lumbar degenerative changes. IMPRESSION: Acute small bowel obstruction, probably proximal to mid ileal. Distal small intestine is collapsed. Nonobstructing renal calculi in the lower pole of the left kidney. Chronic thick-walled bladder. Electronically Signed   By: Paulina Fusi M.D.   On: 03/08/2018 15:16    Review of Systems  Constitutional: Positive for fever (he thinks he may have had fever but it sounds like diaphoresis ). Negative for chills, diaphoresis, malaise/fatigue and  weight loss.  HENT: Negative.   Eyes: Negative.   Respiratory: Negative.   Cardiovascular: Negative.   Gastrointestinal: Positive for abdominal pain (pain started last PM, after eating chicken), constipation (No Bm for about 24 - 36 hours), heartburn, nausea and vomiting. Negative for blood in stool, diarrhea and melena.  Genitourinary: Negative.   Musculoskeletal: Negative.   Skin: Negative.   Neurological: Negative.   Endo/Heme/Allergies: Negative.   Psychiatric/Behavioral: Negative.    Blood pressure (!) 128/93, pulse 97, temperature 97.7 F (36.5 C), resp. rate 20, SpO2 96 %. Physical Exam  Constitutional: He is oriented to person, place, and time. He appears well-developed and well-nourished. No distress.  HENT:  Head: Normocephalic and atraumatic.  Mouth/Throat: No oropharyngeal exudate.  Eyes: Right eye exhibits no discharge. Left eye exhibits no discharge. No scleral icterus.  Pupils are equal  Neck: Normal range of motion. Neck supple. No JVD present. No tracheal deviation present. No thyromegaly present.  Cardiovascular: Regular rhythm, normal heart sounds and intact distal pulses.  No murmur heard. tachycardic  Respiratory: Effort normal and breath sounds normal. No respiratory distress. He has no wheezes. He has no rales. He exhibits no tenderness.  GI: He exhibits distension. He exhibits no mass. There is no abdominal tenderness. There is no rebound and no guarding.    Very distended, has a midline, appendectomy and cholecystectomy scars.  No BS.  He is not really tender, just uncomfortable from the distension.  Musculoskeletal:        General: No tenderness or edema.  Lymphadenopathy:    He has no cervical adenopathy.  Neurological: He is alert and oriented to person, place, and time. A cranial nerve deficit is present.  Skin: Skin is warm and dry. No rash noted. He is not diaphoretic. No erythema. No pallor.  Psychiatric: He has a normal mood and affect. His  behavior is normal. Judgment and thought content normal.    Assessment/Plan: Syncope SBO  AKI - creatinine up to 1.63 Hx of gastric perforation, cholecystectomy, appendectomy, and SBO with that hospitalization age 11 - [facility] Primary hyperparathyroidism - right inferior parathyroidectomy 12/2015, Dr. Darnell Level Hx GERD Hx of nephrolithiasis with left non obstructing renal calculi.     Plan:  Admit to Medicine.  They will evaluate the syncope.  We recommend NG decompression, IV fluid hydration, NPO, and SB protocol later this PM after the stomach is adequately decompressed. We will follow with you.  Labs ordered for the AM.    Kaleem Sartwell 03/08/2018, 3:53 PM

## 2018-03-08 NOTE — Progress Notes (Signed)
Abdominal X-Ray done.  Lina Sar, RN

## 2018-03-08 NOTE — ED Triage Notes (Signed)
His family phoned EMS r/t pt. Had two syncopal episodes, each while vomiting this morning. He arrives here awake, alert, oriented and in no distress.

## 2018-03-08 NOTE — ED Notes (Signed)
Patient transported to CT 

## 2018-03-09 ENCOUNTER — Inpatient Hospital Stay (HOSPITAL_COMMUNITY): Payer: 59

## 2018-03-09 DIAGNOSIS — E869 Volume depletion, unspecified: Secondary | ICD-10-CM | POA: Diagnosis present

## 2018-03-09 DIAGNOSIS — R55 Syncope and collapse: Secondary | ICD-10-CM | POA: Diagnosis present

## 2018-03-09 DIAGNOSIS — N183 Chronic kidney disease, stage 3 unspecified: Secondary | ICD-10-CM | POA: Diagnosis present

## 2018-03-09 LAB — BASIC METABOLIC PANEL
Anion gap: 10 (ref 5–15)
BUN: 22 mg/dL — ABNORMAL HIGH (ref 6–20)
CHLORIDE: 106 mmol/L (ref 98–111)
CO2: 25 mmol/L (ref 22–32)
Calcium: 8.4 mg/dL — ABNORMAL LOW (ref 8.9–10.3)
Creatinine, Ser: 1.45 mg/dL — ABNORMAL HIGH (ref 0.61–1.24)
GFR calc Af Amer: >60 mL/min (ref >60)
GFR calc non Af Amer: 52 mL/min — ABNORMAL LOW (ref >60)
Glucose, Bld: 110 mg/dL — ABNORMAL HIGH (ref 70–99)
POTASSIUM: 4.1 mmol/L (ref 3.5–5.1)
Sodium: 141 mmol/L (ref 135–145)

## 2018-03-09 LAB — CBC
HCT: 42.6 % (ref 39.0–52.0)
Hemoglobin: 13.7 g/dL (ref 13.0–17.0)
MCH: 29.9 pg (ref 26.0–34.0)
MCHC: 32.2 g/dL (ref 30.0–36.0)
MCV: 93 fL (ref 80.0–100.0)
Platelets: 211 10*3/uL (ref 150–400)
RBC: 4.58 MIL/uL (ref 4.22–5.81)
RDW: 13.3 % (ref 11.5–15.5)
WBC: 6.4 10*3/uL (ref 4.0–10.5)
nRBC: 0 % (ref 0.0–0.2)

## 2018-03-09 LAB — HIV ANTIBODY (ROUTINE TESTING W REFLEX): HIV Screen 4th Generation wRfx: NONREACTIVE

## 2018-03-09 MED ORDER — SODIUM CHLORIDE 0.9 % IV BOLUS
2000.0000 mL | Freq: Once | INTRAVENOUS | Status: AC
  Administered 2018-03-09: 1000 mL via INTRAVENOUS

## 2018-03-09 NOTE — Progress Notes (Signed)
[facility] Progress Note  Subjective: feeling better, walking in the halls, NG in.    Vitals:   03/09/18 0202 03/09/18 0529 03/09/18 1100 03/09/18 1317  BP: 137/87 138/79  (!) 146/85  Pulse: 73 71  80  Resp: 16 16    Temp: 98.2 F (36.8 C) 98.6 F (37 C)  98.7 F (37.1 C)  TempSrc: Oral Oral  Oral  SpO2: 99% 98%  98%  Weight:   91.6 kg   Height:   6\' 3"  (1.905 m)     Inpatient medications: . enoxaparin (LOVENOX) injection  40 mg Subcutaneous Q24H   . dextrose 5 % and 0.9% NaCl 100 mL/hr at 03/09/18 0527  . famotidine (PEPCID) IV 20 mg (03/08/18 2130)  . sodium chloride     acetaminophen **OR** acetaminophen, HYDROmorphone (DILAUDID) injection, ondansetron **OR** ondansetron (ZOFRAN) IV, phenol  Exam: General: alert and energetic, no distress Neurology: Awake and alert, non focal Head and Neck. Head normocephalic. Neck supple with no adenopathy or thyromegaly.   E ENT: no pallor, no icterus, oral mucosa dry. NG tube in place. Cardiovascular: No JVD. S1-S2 present, rhythmic, no gallops, rubs, or murmurs. No lower extremity edema. Pulmonary: vesicular breath sounds bilaterally, adequate air movement, no wheezing, rhonchi or rales. Gastrointestinal. nondistended, minimally tender to deep palpation, increased bowel sounds, no organomegaly, no rebound or guarding Skin. No rashes Musculoskeletal: no joint deformities   Presentation Summary:  Gregory Sweeney is a 58 y.o. male with medical history significant of multiple abdominal surgeries including appendectomy, cholecystectomy and a perforated ulcer repair.  Patient was at his usual state of health until yesterday late afternoon when he ate chicken wings, for late lunch, immediately after that he experienced abdominal pain, moderate to severe in intensity, colicky in nature, with no improving or worsening factors, associated with nausea, vomiting, and abdominal distention, he did notice he was not able to pass gas and he has  not had any bowel movements since yesterday.  He had bowel obstructions in the past.  He continued to have symptoms overnight, early this morning while he was trying to get into the restroom he had a syncope episode preceded by dizziness, lightheadedness and diaphoresis.  EMS was called and he had another syncope episode while in the ambulance.  ED Course: Patient was noted to have significant abdominal distention, further work-up with CT of the abdomen revealed bowel obstruction, NG tube was placed, he received IV fluids and surgery was consulted.  Recommendations for conservative medical therapy.        Hospital Problems/ Course:  1.  Acute small bowel obstruction.  Likely due to adhesions related to prior abdominal surgeries.  Much better w/ NG tube to suction, and follow-up small bowel protocol per surgery recommendations.  No flatus or BM yet.    - cont NG suction and other Surgery rec's - cont NPO/ IVF's  2.  Acute kidney injury on chronic kidney disease stage III: baseline creatinine is 1.2, calculated GFR 79.  Currently likely prerenal acute kidney injury, continue hydration with isotonic saline at 100 mL per hour, avoid hypotension and nephrotoxic agents - increase IVF's w/ NG losses and NPO - check creat in am - hx of BPH and thickened bladder wall on prior imaging  3.  Reactive leukocytosis.  No signs of systemic infection, continue to hold on antibiotic therapy - follow WBC  4.  Vasovagal syncope.  Patient describes typical vasovagal syncope with prodromal symptoms. Likely due to bowel obstruction and vol depletion.  Will continue treatment for acute bowel obstruction, continue hydration, neurochecks per unit protocol.  Currently will hold on further telemetry monitoring, his EKG has normal sinus rhythm. No further w/u at this time.     DVT prophylaxis: enoxaparin  Code Status:  full  Family Communication: wife at bedside Disposition Plan: Pending clinical  improvement Consults called: Surgery Admission status: Medical ward   Vinson Moselle MD [facility] pgr [phone] 03/09/2018, 1:47 PM   Recent Labs  Lab 03/08/18 1025 03/09/18 0453  NA 140 141  K 4.4 4.1  CL 102 106  CO2 26 25  GLUCOSE 135* 110*  BUN 19 22*  CREATININE 1.63* 1.45*  CALCIUM 10.6* 8.4*   No results for input(s): AST, ALT, ALKPHOS, BILITOT, PROT, ALBUMIN in the last 168 hours. Recent Labs  Lab 03/08/18 1025 03/09/18 0453  WBC 15.7* 6.4  HGB 16.8 13.7  HCT 51.8 42.6  MCV 91.8 93.0  PLT 246 211   Iron/TIBC/Ferritin/ %Sat No results found for: IRON, TIBC, FERRITIN, IRONPCTSAT

## 2018-03-09 NOTE — Progress Notes (Addendum)
    CC: Abdominal pain, nausea and vomiting  Subjective: Patient is much better this a.m.  There is only 550 recorded from the NG but he says he is on his third canister and it is currently about half full.    So he has had close to 3 L out through his NG.  It still draining but his abdomen is much softer and less distended this a.m. than yesterday.  He is also reporting some flatus.  Objective: Vital signs in last 24 hours: Temp:  [97.7 F (36.5 C)-99.5 F (37.5 C)] 98.6 F (37 C) (01/15 0529) Pulse Rate:  [71-113] 71 (01/15 0529) Resp:  [15-24] 16 (01/15 0529) BP: (116-185)/(78-115) 138/79 (01/15 0529) SpO2:  [95 %-100 %] 98 % (01/15 0529)  N.p.o. 1200 IV 500 urine 550 NG Afebrile vital signs are stable WBC 15.7>> 6.4 Creatinine 1.63 >> 1.45 Single view film this a.m. shows contrast in the colon  Intake/Output from previous day: 01/14 0701 - 01/15 0700 In: 1228.8 [I.V.:1178.8; IV Piggyback:50] Out: 1050 [Urine:500; Emesis/NG output:550] Intake/Output this shift: No intake/output data recorded.  General appearance: alert, cooperative and no distress Resp: clear to auscultation bilaterally GI: Soft, much less distended, few bowel sounds, some flatus.  Lab Results:  Recent Labs    03/08/18 1025 03/09/18 0453  WBC 15.7* 6.4  HGB 16.8 13.7  HCT 51.8 42.6  PLT 246 211    BMET Recent Labs    03/08/18 1025 03/09/18 0453  NA 140 141  K 4.4 4.1  CL 102 106  CO2 26 25  GLUCOSE 135* 110*  BUN 19 22*  CREATININE 1.63* 1.45*  CALCIUM 10.6* 8.4*   PT/INR No results for input(s): LABPROT, INR in the last 72 hours.  No results for input(s): AST, ALT, ALKPHOS, BILITOT, PROT, ALBUMIN in the last 168 hours.   Lipase  No results found for: LIPASE   Medications: . enoxaparin (LOVENOX) injection  40 mg Subcutaneous Q24H   . dextrose 5 % and 0.9% NaCl 100 mL/hr at 03/09/18 0527  . famotidine (PEPCID) IV 20 mg (03/08/18 2130)  . sodium chloride      Assessment/Plan Syncope Acute kidney injury Primary hyperparathyroidism -right inferior parathyroidectomy 12/2015 Hx GERD Hx nephrolithiasis with nonobstructing level left renal calculi  SBO Hx perforated gastric ulcer, appendectomy, cholecystectomy, age 58  FEN: IV fluids/n.p.o. ID: None DVT: Lovenox Follow-up: TBD  Plan: Per the bowel protocol should probably pull his NG but he is having so much drainage I plan to leave it in a little bit longer.   Again giving him some sips of clears and having to mobilize.  I will come back later and check on him and probably clamp his tube if he continues to do well.   LOS: 1 day    Gregory Sweeney 03/09/2018 [phone]  Agree with above. Izola Price, in room with patient.  KUB with contrast in colon. Has tolerated po's with NGT clamped.  2 PM still pretty distended but walking, no flatus. About 400 more in cannister, but some may be the PO clears he is getting.    Ovidio Kin, MD, [facility] Surgery Pager: [phone] Office phone:  [phone]

## 2018-03-10 ENCOUNTER — Inpatient Hospital Stay (HOSPITAL_COMMUNITY): Payer: 59

## 2018-03-10 DIAGNOSIS — N179 Acute kidney failure, unspecified: Secondary | ICD-10-CM

## 2018-03-10 LAB — BASIC METABOLIC PANEL
Anion gap: 8 (ref 5–15)
BUN: 11 mg/dL (ref 6–20)
CHLORIDE: 106 mmol/L (ref 98–111)
CO2: 26 mmol/L (ref 22–32)
Calcium: 7.6 mg/dL — ABNORMAL LOW (ref 8.9–10.3)
Creatinine, Ser: 1.25 mg/dL — ABNORMAL HIGH (ref 0.61–1.24)
GFR calc Af Amer: >60 mL/min (ref >60)
GFR calc non Af Amer: >60 mL/min (ref >60)
Glucose, Bld: 109 mg/dL — ABNORMAL HIGH (ref 70–99)
Potassium: 3.9 mmol/L (ref 3.5–5.1)
Sodium: 140 mmol/L (ref 135–145)

## 2018-03-10 LAB — CBC
HEMATOCRIT: 39.5 % (ref 39.0–52.0)
Hemoglobin: 12.7 g/dL — ABNORMAL LOW (ref 13.0–17.0)
MCH: 29.5 pg (ref 26.0–34.0)
MCHC: 32.2 g/dL (ref 30.0–36.0)
MCV: 91.9 fL (ref 80.0–100.0)
Platelets: 217 10*3/uL (ref 150–400)
RBC: 4.3 MIL/uL (ref 4.22–5.81)
RDW: 12.8 % (ref 11.5–15.5)
WBC: 6.6 10*3/uL (ref 4.0–10.5)
nRBC: 0 % (ref 0.0–0.2)

## 2018-03-10 MED ORDER — MENTHOL 3 MG MT LOZG: 1.0000 | LOZENGE | OROMUCOSAL | Status: DC | PRN

## 2018-03-10 MED ORDER — METOPROLOL TARTRATE 5 MG/5ML IV SOLN
5.0000 mg | INTRAVENOUS | Status: DC | PRN
  Filled 2018-03-10: qty 5

## 2018-03-10 NOTE — Progress Notes (Addendum)
Patient ID: Gregory Sweeney, male   DOB: Jan 07, 1961, 58 y.o.   MRN: [id number]       Subjective: Doing well this morning. Reports passed flatus this AM. No nausea overnight. Tolerated some clears yesterday with tube clamped. 1080cc output overnight from ng tube. Some lower abdominal cramping overnight. No pain now.   Objective: Vital signs in last 24 hours: Temp:  [98.3 F (36.8 C)-98.9 F (37.2 C)] 98.3 F (36.8 C) (01/16 0557) Pulse Rate:  [69-93] 69 (01/16 0557) Resp:  [16] 16 (01/16 0557) BP: (141-180)/(81-104) 141/81 (01/16 0557) SpO2:  [95 %-98 %] 95 % (01/16 0557) Weight:  [91.6 kg-97.4 kg] 97.4 kg (01/16 0557) Last BM Date: 03/07/18 Hypertensive overnight but otherwise wnl & stable.   Intake/Output from previous day: 01/15 0701 - 01/16 0700 In: 2332.3 [P.O.:120; I.V.:821.2; IV Piggyback:1391.1] Out: 2805 [Urine:1725; Emesis/NG output:1080]   Intake/Output this shift: No intake/output data recorded.  PE: Gen: WD, WN, NAD, pleasant Heart: RRR Lungs: CTA b/l Abd: Soft, mild distension, no tenderness r/r/g. Mildly hypoactive BS.  Lab Results:  Recent Labs    03/09/18 0453 03/10/18 0500  WBC 6.4 6.6  HGB 13.7 12.7*  HCT 42.6 39.5  PLT 211 217    BMET Recent Labs    03/09/18 0453 03/10/18 0500  NA 141 140  K 4.1 3.9  CL 106 106  CO2 25 26  GLUCOSE 110* 109*  BUN 22* 11  CREATININE 1.45* 1.25*  CALCIUM 8.4* 7.6*   PT/INR No results for input(s): LABPROT, INR in the last 72 hours. CMP     Component Value Date/Time   NA 140 03/10/2018 0500   K 3.9 03/10/2018 0500   CL 106 03/10/2018 0500   CO2 26 03/10/2018 0500   GLUCOSE 109 (H) 03/10/2018 0500   BUN 11 03/10/2018 0500   CREATININE 1.25 (H) 03/10/2018 0500   CALCIUM 7.6 (L) 03/10/2018 0500   PROT 7.2 05/11/2017 1019   ALBUMIN 4.1 05/11/2017 1019   AST 14 05/11/2017 1019   ALT 15 05/11/2017 1019   ALKPHOS 47 05/11/2017 1019   BILITOT 0.5 05/11/2017 1019   GFRNONAA >60 03/10/2018 0500   GFRAA  >60 03/10/2018 0500   Lipase  No results found for: LIPASE     Studies/Results: Ct Abdomen Pelvis Wo Contrast  Result Date: 03/08/2018 CLINICAL DATA:  Vomiting. Syncopal episode. EXAM: CT ABDOMEN AND PELVIS WITHOUT CONTRAST TECHNIQUE: Multidetector CT imaging of the abdomen and pelvis was performed following the standard protocol without IV contrast. COMPARISON:  05/27/2017 FINDINGS: Lower chest: Basilar pulmonary scarring. No consolidation or collapse. No effusion. Hepatobiliary: Previous cholecystectomy. Liver parenchyma is normal. Pancreas: Normal Spleen: Normal Adrenals/Urinary Tract: Adrenal glands are normal. Kidneys are normal except for some small nonobstructing stones in the lower pole on the left and a left renal cyst. Chronic bladder wall thickening. Stomach/Bowel: Acute small bowel obstruction. Marked dilated fluid and air-filled proximal small intestine. The distal small intestine is collapsed. Obstruction level is probably proximal to mid ileal. Colon appears unremarkable. Vascular/Lymphatic: Normal Reproductive: Normal Other: No free fluid or air. Musculoskeletal: Ordinary lower lumbar degenerative changes. IMPRESSION: Acute small bowel obstruction, probably proximal to mid ileal. Distal small intestine is collapsed. Nonobstructing renal calculi in the lower pole of the left kidney. Chronic thick-walled bladder. Electronically Signed   By: Paulina Fusi M.D.   On: 03/08/2018 15:16   Dg Abd 1 View  Result Date: 03/10/2018 CLINICAL DATA:  Follow-up small bowel obstruction EXAM: ABDOMEN - 1 VIEW COMPARISON:  03/09/2018 FINDINGS: Nasogastric tube tip in the body of the stomach. Several persistent dilated small bowel loops as seen previously. Previously administered contrast remains within the colon. IMPRESSION: Persistent partial but fairly high-grade small bowel obstruction. Previously administered contrast remains within the colon. Electronically Signed   By: Paulina Fusi M.D.   On:  03/10/2018 06:59   Dg Abd Portable 1v-small Bowel Obstruction Protocol-initial, 8 Hr Delay  Result Date: 03/09/2018 CLINICAL DATA:  Small-bowel obstruction EXAM: PORTABLE ABDOMEN - 1 VIEW COMPARISON:  None. FINDINGS: The radiograph is obtained 8 hours after the administration of oral contrast. Contrast material is seen throughout the colon. There is a dilated, gas-filled structure in the central abdomen. This may be stomach or small bowel. IMPRESSION: Dilute contrast material within the colon. Electronically Signed   By: Deatra Robinson M.D.   On: 03/09/2018 04:16   Dg Abd Portable 1v-small Bowel Protocol-position Verification  Result Date: 03/08/2018 CLINICAL DATA:  Check nasogastric catheter placement EXAM: PORTABLE ABDOMEN - 1 VIEW COMPARISON:  03/08/2018 FINDINGS: Scattered large and small bowel gas is noted. Small-bowel dilatation is again seen and stable. Nasogastric catheter is noted coiled within the stomach. IMPRESSION: Nasogastric catheter within the stomach. Electronically Signed   By: Alcide Clever M.D.   On: 03/08/2018 18:15    Anti-infectives: Anti-infectives (From admission, onward)   None       Assessment/Plan Syncope Acute kidney injury Primary hyperparathyroidism -right inferior parathyroidectomy 12/2015 Hx GERD Hx nephrolithiasis with nonobstructing level left renal calculi  SBO -Hx perforated gastric ulcer, appendectomy, cholecystectomy, age 15 -CT 1/14 w. acute small bowel obstruction, probably proximal to mid ileal. Distal small intestine is collapsed. -Xray on 1/15 w/ dilute contrast within colon >> clears with NG tube clamped when ambulating yesterday. Tolerated without N/V. Passing some flatus this morning, however 1080cc output overnight & films with persistent partial sbo pattern. Contrast remains in colon -Cr 1.63 > 1.45 > 1.25 -WBC normalized from admission  15/7 > 6.4 > 6.6 -Mobilize and IS  FEN: Clamp NG tube/trial of clears, IVF ID: None DVT:  Lovenox Follow-up: TBD  Plan: The patients xray on 1/15 showed dilute contrast in the colon. He tolerated po clears yesterday with NG tube clamped and is passing gas this morning. However xrays this morning with persistent partial sbo pattern and 1080cc output from NG tube overnight. Given high output & xray with sbo pattern will leave NG tube, clamp and trial of advancement of diet. Repeat films and labs in AM with possible removal of NG tube. Continue to mobilize and use IS.   Plan:  LOS: 2 days    Jacinto Halim , [facility] 03/10/2018, 8:57 AM Pager: [phone]  Agree with above. Had BM today, but constipated and had some blood with BM Has been very active.  Ovidio Kin, MD, [facility] Surgery Pager: [phone] Office phone:  [phone]

## 2018-03-10 NOTE — Progress Notes (Signed)
[facility] Progress Note  Subjective: +flatus overnight  Vitals:   03/09/18 2137 03/09/18 2336 03/10/18 0101 03/10/18 0557  BP: (!) 168/104 (!) 180/104 (!) 157/95 (!) 141/81  Pulse: 93 72 72 69  Resp: 16   16  Temp: 98.9 F (37.2 C)   98.3 F (36.8 C)  TempSrc: Oral   Oral  SpO2: 98% 96%  95%  Weight:    97.4 kg  Height:        Inpatient medications:  . dextrose 5 % and 0.9% NaCl 125 mL/hr at 03/10/18 1241  . famotidine (PEPCID) IV 20 mg (03/09/18 2012)   acetaminophen **OR** acetaminophen, HYDROmorphone (DILAUDID) injection, menthol-cetylpyridinium, metoprolol tartrate, ondansetron **OR** ondansetron (ZOFRAN) IV, phenol  Exam: General: alert and energetic, no distress Neurology: Awake and alert, non focal Head and Neck. Head normocephalic. Neck supple with no adenopathy or thyromegaly.   E ENT: no pallor, no icterus, oral mucosa dry. NG tube in place. Cardiovascular: No JVD. S1-S2 present, rhythmic, no gallops, rubs, or murmurs. No lower extremity edema. Pulmonary: vesicular breath sounds bilaterally, adequate air movement, no wheezing, rhonchi or rales. Gastrointestinal. nondistended, minimally tender to deep palpation, increased bowel sounds, no organomegaly, no rebound or guarding Skin. No rashes Musculoskeletal: no joint deformities   Presentation Summary:  Gregory Sweeney is a 58 y.o. male with medical history significant of multiple abdominal surgeries including appendectomy, cholecystectomy and a perforated ulcer repair.  Patient was at his usual state of health until yesterday late afternoon when he ate chicken wings, for late lunch, immediately after that he experienced abdominal pain, moderate to severe in intensity, colicky in nature, with no improving or worsening factors, associated with nausea, vomiting, and abdominal distention, he did notice he was not able to pass gas and he has not had any bowel movements since yesterday.  He had bowel obstructions in  the past.  He continued to have symptoms overnight, early this morning while he was trying to get into the restroom he had a syncope episode preceded by dizziness, lightheadedness and diaphoresis.  EMS was called and he had another syncope episode while in the ambulance.  ED Course: Patient was noted to have significant abdominal distention, further work-up with CT of the abdomen revealed bowel obstruction, NG tube was placed, he received IV fluids and surgery was consulted.  Recommendations for conservative medical therapy.        Hospital Problems/ Course:  1.  Acute small bowel obstruction.  Likely due to adhesions related to prior abdominal surgeries.  Seen by Gen Surg and NG placed w/ improvement in symptoms and exam.  Plan per Surgery is to clamp NG today and give clear liquids and check xrays and labs in am. Possible NG removal tomorrow.  - as above  2.  Acute kidney injury on chronic kidney disease stage III: baseline creatinine is 1.2, calculated GFR 79.  Creat 1.6 on admission, down to 1.25 today. At baseline - hx of BPH and thickened bladder wall on prior imaging in 2015 - UA negative - check renal US  3.  Reactive leukocytosis.  No signs of systemic infection, continue to hold on antibiotic therapy - resolved  4.  Vasovagal syncope.  Patient describes typical vasovagal syncope with prodromal symptoms. Likely a complication of the above illness.  Up walkingnow and no arrhythmia on telemetry. Tele dc'd.  No further w/u at this time.     DVT prophylaxis: enoxaparin  Code Status:  full  Family Communication: wife at bedside Disposition  Plan: Pending clinical improvement Consults called: Surgery Admission status: Medical ward   Vinson Moselle MD [facility] pgr [phone] 03/10/2018, 1:47 PM   Recent Labs  Lab 03/08/18 1025 03/09/18 0453 03/10/18 0500  NA 140 141 140  K 4.4 4.1 3.9  CL 102 106 106  CO2 26 25 26   GLUCOSE 135* 110* 109*  BUN 19  22* 11  CREATININE 1.63* 1.45* 1.25*  CALCIUM 10.6* 8.4* 7.6*   No results for input(s): AST, ALT, ALKPHOS, BILITOT, PROT, ALBUMIN in the last 168 hours. Recent Labs  Lab 03/08/18 1025 03/09/18 0453 03/10/18 0500  WBC 15.7* 6.4 6.6  HGB 16.8 13.7 12.7*  HCT 51.8 42.6 39.5  MCV 91.8 93.0 91.9  PLT 246 211 217   Iron/TIBC/Ferritin/ %Sat No results found for: IRON, TIBC, FERRITIN, IRONPCTSAT

## 2018-03-10 NOTE — Progress Notes (Signed)
Pt BP at 2137 was 168/104 with a pulse of 93.  Pt reported pain at this time, so PRN dilaudid was given.    BP was rechecked at 2336 and was 180/104 with a pulse of 72.  On-call MD was notified of this BP and also that the pt received a 2000 ml bolus during the day, and had 400 ml of urine output during my shift. MD ordered 5 mg Metoprolol IV q4 with parameters of SBP.180.    Before administering metoprolol, I rechecked the BP at 0101 and it was 157/95 with pulse of 72, so I did not administer at this time.   Will continue to monitor.

## 2018-03-11 ENCOUNTER — Inpatient Hospital Stay (HOSPITAL_COMMUNITY): Payer: 59

## 2018-03-11 LAB — BASIC METABOLIC PANEL
ANION GAP: 8 (ref 5–15)
BUN: 10 mg/dL (ref 6–20)
CALCIUM: 7.7 mg/dL — AB (ref 8.9–10.3)
CO2: 23 mmol/L (ref 22–32)
Chloride: 108 mmol/L (ref 98–111)
Creatinine, Ser: 1.14 mg/dL (ref 0.61–1.24)
GFR calc Af Amer: >60 mL/min (ref >60)
GFR calc non Af Amer: >60 mL/min (ref >60)
Glucose, Bld: 114 mg/dL — ABNORMAL HIGH (ref 70–99)
Potassium: 3.6 mmol/L (ref 3.5–5.1)
Sodium: 139 mmol/L (ref 135–145)

## 2018-03-11 LAB — CBC
HCT: 36.4 % — ABNORMAL LOW (ref 39.0–52.0)
Hemoglobin: 11.9 g/dL — ABNORMAL LOW (ref 13.0–17.0)
MCH: 29.2 pg (ref 26.0–34.0)
MCHC: 32.7 g/dL (ref 30.0–36.0)
MCV: 89.4 fL (ref 80.0–100.0)
NRBC: 0 % (ref 0.0–0.2)
Platelets: 216 10*3/uL (ref 150–400)
RBC: 4.07 MIL/uL — ABNORMAL LOW (ref 4.22–5.81)
RDW: 12.5 % (ref 11.5–15.5)
WBC: 7.3 10*3/uL (ref 4.0–10.5)

## 2018-03-11 MED ORDER — FAMOTIDINE 20 MG PO TABS: 20.0000 mg | ORAL_TABLET | Freq: Every day | ORAL | Status: DC

## 2018-03-11 NOTE — Progress Notes (Signed)
Received verbal order from Betha Loa PA to discontinue NG tube.  This was passed on to the day shift nurse at shift change.

## 2018-03-11 NOTE — Progress Notes (Signed)
PROGRESS NOTE    Gregory Sweeney  FHL:[id number] DOB: 1961/01/13 DOA: 03/08/2018 PCP: Waldon Merl, PA-C  Outpatient Specialists:   Brief Narrative: Gregory Sweeney is a 58 y.o. male with medical history significant of multiple abdominal surgeries including appendectomy, cholecystectomy and a perforated ulcer repair.  He was admitted with colicky abdominal pain.  Work-up done revealed small bowel obstruction.  Patient has improved significantly.  Likely discharge tomorrow if patient continues to improve.   Assessment & Plan:   Principal Problem:   Small bowel obstruction (HCC) Active Problems:   ARF (acute renal failure) (HCC)   CKD (chronic kidney disease), stage III (HCC)   Volume depletion   Syncope, vasovagal   AKI (acute kidney injury) (HCC)  Acute small bowel obstruction. Likely due to adhesions related to prior abdominal surgeries. Seen by Gen Surg and NG placed w/ improvement in symptoms and exam.  Plan per Surgery is to clamp NG today and give clear liquids and check xrays and labs in am. Possible NG removal tomorrow.  - as above 03/11/2018: Improved significantly.  Likely discharge tomorrow if patient continues to improve.  2.Acute kidney injury on chronic kidney disease stage III: baseline creatinine is 1.2, calculated GFR 79. Creat 1.6 on admission, down to 1.25 today. At baseline - hx of BPH and thickened bladder wall on prior imaging in 2015 - UA negative - check renal US 03/11/2018 AKI has improved significantly.  3.Reactive leukocytosis. No signs of systemic infection, continue to hold on antibiotic therapy - resolved  4.Vasovagal syncope.Patient describes typical vasovagal syncope with prodromal symptoms.Likely a complication of the above illness.  Up walkingnow and no arrhythmia on telemetry. Tele dc'd.  No further w/u at this time.     DVT prophylaxis:enoxaparin Code Status:full Family Communication: Disposition Plan:Likely home  tomorrow.  Consults called:Surgery  Procedures:   None  Antimicrobials:   None  Subjective: No new complaints No abdominal pain  Objective: Vitals:   03/10/18 1414 03/10/18 2147 03/11/18 0523 03/11/18 1357  BP: (!) 157/94 (!) 177/94 (!) 158/87 (!) 170/84  Pulse: 83 (!) 102 77 82  Resp: 18 18 18 18   Temp: 99.5 F (37.5 C) 99.3 F (37.4 C) 99.7 F (37.6 C) 99 F (37.2 C)  TempSrc: Oral Oral Oral Oral  SpO2: 98% 98% 99% 98%  Weight:   95.5 kg   Height:        Intake/Output Summary (Last 24 hours) at 03/11/2018 1709 Last data filed at 03/11/2018 1400 Gross per 24 hour  Intake 1500 ml  Output 2375 ml  Net -875 ml   Filed Weights   03/09/18 1533 03/10/18 0557 03/11/18 0523  Weight: 91.6 kg 97.4 kg 95.5 kg    Examination:  General exam: Appears calm and comfortable  Respiratory system: Clear to auscultation.  Cardiovascular system: S1 & S2.  Gastrointestinal system: Abdomen is nondistended, soft and nontender. No organomegaly or masses felt.  Sounds appreciated.  Prior surgical scar. Central nervous system: Alert and oriented. No focal neurological deficits. Extremities: Symmetric 5 x 5 power.  Data Reviewed: I have personally reviewed following labs and imaging studies  CBC: Recent Labs  Lab 03/08/18 1025 03/09/18 0453 03/10/18 0500 03/11/18 0449  WBC 15.7* 6.4 6.6 7.3  HGB 16.8 13.7 12.7* 11.9*  HCT 51.8 42.6 39.5 36.4*  MCV 91.8 93.0 91.9 89.4  PLT 246 211 217 216   Basic Metabolic Panel: Recent Labs  Lab 03/08/18 1025 03/09/18 0453 03/10/18 0500 03/11/18 0449  NA 140 141  140 139  K 4.4 4.1 3.9 3.6  CL 102 106 106 108  CO2 26 25 26 23   GLUCOSE 135* 110* 109* 114*  BUN 19 22* 11 10  CREATININE 1.63* 1.45* 1.25* 1.14  CALCIUM 10.6* 8.4* 7.6* 7.7*   GFR: Estimated Creatinine Clearance: 83.4 mL/min (by C-G formula based on SCr of 1.14 mg/dL). Liver Function Tests: No results for input(s): AST, ALT, ALKPHOS, BILITOT, PROT, ALBUMIN in the  last 168 hours. No results for input(s): LIPASE, AMYLASE in the last 168 hours. No results for input(s): AMMONIA in the last 168 hours. Coagulation Profile: No results for input(s): INR, PROTIME in the last 168 hours. Cardiac Enzymes: No results for input(s): CKTOTAL, CKMB, CKMBINDEX, TROPONINI in the last 168 hours. BNP (last 3 results) No results for input(s): PROBNP in the last 8760 hours. HbA1C: No results for input(s): HGBA1C in the last 72 hours. CBG: Recent Labs  Lab 03/08/18 1037  GLUCAP 103*   Lipid Profile: No results for input(s): CHOL, HDL, LDLCALC, TRIG, CHOLHDL, LDLDIRECT in the last 72 hours. Thyroid Function Tests: No results for input(s): TSH, T4TOTAL, FREET4, T3FREE, THYROIDAB in the last 72 hours. Anemia Panel: No results for input(s): VITAMINB12, FOLATE, FERRITIN, TIBC, IRON, RETICCTPCT in the last 72 hours. Urine analysis:    Component Value Date/Time   COLORURINE YELLOW 03/08/2018 1025   APPEARANCEUR CLOUDY (A) 03/08/2018 1025   LABSPEC 1.023 03/08/2018 1025   PHURINE 5.0 03/08/2018 1025   GLUCOSEU NEGATIVE 03/08/2018 1025   GLUCOSEU NEGATIVE 06/25/2015 0724   HGBUR NEGATIVE 03/08/2018 1025   BILIRUBINUR NEGATIVE 03/08/2018 1025   BILIRUBINUR negative 05/11/2017 1000   KETONESUR NEGATIVE 03/08/2018 1025   PROTEINUR 100 (A) 03/08/2018 1025   UROBILINOGEN 0.2 05/11/2017 1000   UROBILINOGEN 0.2 06/25/2015 0724   NITRITE NEGATIVE 03/08/2018 1025   LEUKOCYTESUR NEGATIVE 03/08/2018 1025   Sepsis Labs: @LABRCNTIP (procalcitonin:4,lacticidven:4)  )No results found for this or any previous visit (from the past 240 hour(s)).       Radiology Studies: Dg Abd 1 View  Result Date: 03/11/2018 CLINICAL DATA:  Follow up small bowel obstruction EXAM: ABDOMEN - 1 VIEW COMPARISON:  03/10/2018 FINDINGS: Contrast material is again noted throughout the colon stable from the previous exam. Small bowel air is noted although no significant dilatation is noted. No bony  abnormality is seen. No free air is noted. Nasogastric catheter is seen within the stomach. IMPRESSION: Improvement in small bowel dilatation. Electronically Signed   By: Alcide CleverMark  Lukens M.D.   On: 03/11/2018 07:12   Dg Abd 1 View  Result Date: 03/10/2018 CLINICAL DATA:  Follow-up small bowel obstruction EXAM: ABDOMEN - 1 VIEW COMPARISON:  03/09/2018 FINDINGS: Nasogastric tube tip in the body of the stomach. Several persistent dilated small bowel loops as seen previously. Previously administered contrast remains within the colon. IMPRESSION: Persistent partial but fairly high-grade small bowel obstruction. Previously administered contrast remains within the colon. Electronically Signed   By: Paulina FusiMark  Shogry M.D.   On: 03/10/2018 06:59   [place] Renal  Result Date: 03/10/2018 CLINICAL DATA:  Acute kidney injury. EXAM: RENAL / URINARY TRACT ULTRASOUND COMPLETE COMPARISON:  KUB 03/10/2018.  CT 03/08/2018, 05/27/2017. FINDINGS: Right Kidney: Renal measurements: 11.9 x 5.4 x 5.9 cm = volume: 198.0 mL . Echogenicity within normal limits. No mass or hydronephrosis visualized. Left Kidney: Renal measurements: 12.3 x 6.3 x 4.8 cm = volume: 194.9 mL. 2.7 x 1.6 x 2.1 cyst with thin septation, most likely benign. Unchanged from prior CT studies. 8 mm  nonobstructing stone lower pole. Bladder: Irregular bladder wall.  Bilateral ureteral jets noted. IMPRESSION: 1. Irregular bladder wall. Active bladder pathology including cystitis could present this fashion. 2. 2.7 cm cyst with thin septation noted lower pole left kidney. This is unchanged from prior CT studies. This is most likely a benign cyst. 3.  8 mm nonobstructing left lower renal pole stone again noted. Electronically Signed   By: Maisie Fus  Register   On: 03/10/2018 16:18        Scheduled Meds: . Melene Muller ON 03/12/2018] famotidine  20 mg Oral QHS   Continuous Infusions: . dextrose 5 % and 0.9% NaCl 125 mL/hr at 03/11/18 0600  . famotidine (PEPCID) IV 20 mg (03/10/18  2103)     LOS: 3 days    Time spent: 25 minutes    Berton Mount, MD  [facility] Pager #: [phone] 7PM-7AM contact night coverage as above

## 2018-03-11 NOTE — Progress Notes (Addendum)
Patient ID: Gregory Sweeney, male   DOB: 05-24-1960, 58 y.o.   MRN: [id number]       Subjective: CC: Bloated Pt pleased that NG tube was removed this AM. Reports no abdominal pain but some bloating after eating. Tolerating clears. No N/V. Passing flatus. 3 hard BM in last 24 hours. Mobilizing well.   Objective: Vital signs in last 24 hours: Temp:  [99.3 F (37.4 C)-99.7 F (37.6 C)] 99.7 F (37.6 C) (01/17 0523) Pulse Rate:  [77-102] 77 (01/17 0523) Resp:  [18] 18 (01/17 0523) BP: (157-177)/(87-94) 158/87 (01/17 0523) SpO2:  [98 %-99 %] 99 % (01/17 0523) Weight:  [95.5 kg] 95.5 kg (01/17 0523) Last BM Date: 03/07/18 Reviewed 8:36 AM  Intake/Output from previous day: 01/16 0701 - 01/17 0700 In: 2336.6 [I.V.:2336.6] Out: 2900 [Urine:2900] Intake/Output this shift: No intake/output data recorded. Reviewed 8:37 AM  PE: Gen: WD, WN, NAD, pleasant Heart: RRR, no obvious murmur Lungs: CTA b/l Abd: Soft, with mild distension. No tenderness. +BS.    Lab Results: Reviewed 8:46 AM Recent Labs    03/10/18 0500 03/11/18 0449  WBC 6.6 7.3  HGB 12.7* 11.9*  HCT 39.5 36.4*  PLT 217 216   BMET Recent Labs    03/10/18 0500 03/11/18 0449  NA 140 139  K 3.9 3.6  CL 106 108  CO2 26 23  GLUCOSE 109* 114*  BUN 11 10  CREATININE 1.25* 1.14  CALCIUM 7.6* 7.7*   PT/INR No results for input(s): LABPROT, INR in the last 72 hours. CMP     Component Value Date/Time   NA 139 03/11/2018 0449   K 3.6 03/11/2018 0449   CL 108 03/11/2018 0449   CO2 23 03/11/2018 0449   GLUCOSE 114 (H) 03/11/2018 0449   BUN 10 03/11/2018 0449   CREATININE 1.14 03/11/2018 0449   CALCIUM 7.7 (L) 03/11/2018 0449   PROT 7.2 05/11/2017 1019   ALBUMIN 4.1 05/11/2017 1019   AST 14 05/11/2017 1019   ALT 15 05/11/2017 1019   ALKPHOS 47 05/11/2017 1019   BILITOT 0.5 05/11/2017 1019   GFRNONAA >60 03/11/2018 0449   GFRAA >60 03/11/2018 0449   Lipase  No results found for:  LIPASE     Studies/Results: Dg Abd 1 View  Result Date: 03/11/2018 CLINICAL DATA:  Follow up small bowel obstruction EXAM: ABDOMEN - 1 VIEW COMPARISON:  03/10/2018 FINDINGS: Contrast material is again noted throughout the colon stable from the previous exam. Small bowel air is noted although no significant dilatation is noted. No bony abnormality is seen. No free air is noted. Nasogastric catheter is seen within the stomach. IMPRESSION: Improvement in small bowel dilatation. Electronically Signed   By: Alcide Clever M.D.   On: 03/11/2018 07:12   Dg Abd 1 View  Result Date: 03/10/2018 CLINICAL DATA:  Follow-up small bowel obstruction EXAM: ABDOMEN - 1 VIEW COMPARISON:  03/09/2018 FINDINGS: Nasogastric tube tip in the body of the stomach. Several persistent dilated small bowel loops as seen previously. Previously administered contrast remains within the colon. IMPRESSION: Persistent partial but fairly high-grade small bowel obstruction. Previously administered contrast remains within the colon. Electronically Signed   By: Paulina Fusi M.D.   On: 03/10/2018 06:59   US Renal  Result Date: 03/10/2018 CLINICAL DATA:  Acute kidney injury. EXAM: RENAL / URINARY TRACT ULTRASOUND COMPLETE COMPARISON:  KUB 03/10/2018.  CT 03/08/2018, 05/27/2017. FINDINGS: Right Kidney: Renal measurements: 11.9 x 5.4 x 5.9 cm = volume: 198.0 mL . Echogenicity within normal  limits. No mass or hydronephrosis visualized. Left Kidney: Renal measurements: 12.3 x 6.3 x 4.8 cm = volume: 194.9 mL. 2.7 x 1.6 x 2.1 cyst with thin septation, most likely benign. Unchanged from prior CT studies. 8 mm nonobstructing stone lower pole. Bladder: Irregular bladder wall.  Bilateral ureteral jets noted. IMPRESSION: 1. Irregular bladder wall. Active bladder pathology including cystitis could present this fashion. 2. 2.7 cm cyst with thin septation noted lower pole left kidney. This is unchanged from prior CT studies. This is most likely a benign  cyst. 3.  8 mm nonobstructing left lower renal pole stone again noted. Electronically Signed   By: Maisie Fus  Register   On: 03/10/2018 16:18    Anti-infectives: Anti-infectives (From admission, onward)   None       Assessment/Plan Syncope Acute kidney injury Primary hyperparathyroidism-right inferior parathyroidectomy 12/2015 Hx GERD Hx nephrolithiasis with nonobstructing level left renal calculi  SBO -Hx perforated gastric ulcer, appendectomy, cholecystectomy, age 76 -CT 1/14 w. acute small bowel obstruction, probably proximal to mid ileal. Distal small intestine is collapsed. -Xray on 1/15 w/ dilute contrast within colon >> NG tube clamped with ambulation -Xray on 1/16 w/ persistent partial SBO, contrast in colon >> clinically doing well. No N/V, passing flatus. NG tube clamped & given clears.  -Xray 1/17 w/ improvement of small bowel dilation.  Clinically patient is doing well.  3 hard bowel movements overnight.  No abdominal pain.  No nausea or vomiting.  Tolerating clears. -NG tube removed.  Advance diet as tolerated -Mobilize and IS  FEN: Fulls; Advance as tolerated ID: None DVT: Lovenox Follow-up: TBD   LOS: 3 days   Plan: NG tube removed this a.m.    Clinically patient is doing well.  Will advance diet as tolerated.  Continue to encourage mobilization and incentive spirometry.  Jacinto Halim , [facility] 03/11/2018, 8:35 AM Pager: [phone]  Agree with above.  Seems to have turned the corner.  Ovidio Kin, MD, [facility] Pager: [phone] Office phone:  [phone]

## 2018-03-11 NOTE — Progress Notes (Signed)
Pharmacy IV to PO conversion  The patient is receiving Famotidine by the intravenous route.  Based on criteria approved by the Pharmacy and Therapeutics Committee and the Medical Executive Committee, the medication is being converted to the equivalent oral dose form to start tomorrow (will give IV dose tonight).   No active GI bleeding or impaired absorption  Not s/p esophagectomy  Documented ability to take oral medications for > 24 hr  Plan to continue treatment for at least 1 day  If you have any questions about this conversion, please contact the Pharmacy Department (ext [phone]).  Thank you.  Bernadene Person, PharmD, BCPS [phone] 03/11/2018, 3:07 PM

## 2018-03-12 MED ORDER — AMLODIPINE BESYLATE 5 MG PO TABS: 5.0000 mg | ORAL_TABLET | Freq: Every day | ORAL | 11 refills | Status: DC

## 2018-03-12 NOTE — Discharge Summary (Signed)
Physician Discharge Summary  Patient ID: Gregory Sweeney MRN: [id number] DOB/AGE: 60-Jan-1962 60 y.o.  Admit date: 03/08/2018 Discharge date: 03/12/2018  Admission Diagnoses:  Discharge Diagnoses:  Principal Problem:   Small bowel obstruction (HCC) Active Problems:   ARF (acute renal failure) (HCC)   CKD (chronic kidney disease), stage III (HCC)   Volume depletion   Syncope, vasovagal   AKI (acute kidney injury) (HCC) Hypertension   Discharged Condition: stable  Hospital Course: Gregory Sweeney a 58 year old African-American male, with past medical history significant for hypertension, nephrolithiasis, GERD, gastric ulcer, appendicitis and bowel obstruction.  Patient has had multiple abdominal surgeries including appendectomy, cholecystectomy and a perforated ulcer repair.    Patient presented with colicky abdominal pain.  Work-up done revealed acute small bowel obstruction.  Patient was admitted for further assessment and management.  Patient was managed supportively.  Surgical team was consulted to assist with patient's management.  NG tube was initially placed.  NG tube was removed as patient improved.  Patient's abdominal pain has resolved.  Patient has had bowel movements.  Patient tolerates food orally, without any problems.  Elevated blood pressure was noted during the hospital stay.  Patient will be started on Norvasc 5 mg p.o. once daily.  Patient will need to check his blood pressure 3 times daily, document the readings and reviewed with a primary care provider in a week.  Patient will follow with PCP in 1 week.  Acute small bowel obstruction: Likely due to adhesions related to prior abdominal surgeries. Seen by Gen Surg and NG placed with improvement in symptoms.   Patient is back to his baseline.  Acute kidney injury on chronic kidney disease stage III:  Baseline creatinine is 1.2, calculated GFR 79. Serum creatinine was 1.6 on admission.   AKI is likely secondary to  volume depletion.   Serum creatinine was down to 1.14 prior to discharge.    Reactive leukocytosis: No signs of systemic infection. Results. WBC was down to 7 prior to discharge.  Syncope:  This is likely secondary to above (acute small bowel obstruction and volume depletion).    Consults: general surgery  Significant Diagnostic Studies:  CT scan of the abdomen and pelvis without contrast done on admission revealed "Acute small bowel obstruction, probably proximal to mid ileal.  Distal small intestine is collapsed.  Nonobstructing renal calculi in the lower pole of the left kidney.  Chronic thick-walled bladder.  Discharge Exam: Blood pressure (!) 146/91, pulse 67, temperature 98.4 F (36.9 C), temperature source Oral, resp. rate 16, height 6\' 3"  (1.905 m), weight 97.5 kg, SpO2 97 %.  Disposition: Discharge disposition: 01-Home or Self Care   Discharge Instructions    Diet - low sodium heart healthy   Complete by:  As directed    Increase activity slowly   Complete by:  As directed      Allergies as of 03/12/2018      Reactions   Ampicillin Anaphylaxis   DID THE REACTION INVOLVE: Swelling of the face/tongue/throat, SOB, or low BP? Yes Sudden or severe rash/hives, skin peeling, or the inside of the mouth or nose? Yes Did it require medical treatment? Yes When did it last happen?less than 10 yrs If all above answers are "NO", may proceed with cephalosporin use.   Banana Anaphylaxis   Latex Hives, Shortness Of Breath, Itching, Rash   Morphine And Related Hives, Shortness Of Breath, Itching, Nausea And Vomiting   headache   Other Anaphylaxis   Just pecans  Penicillins Hives, Shortness Of Breath   Has patient had a PCN reaction causing immediate rash, facial/tongue/throat swelling, SOB or lightheadedness with hypotension: Yes Has patient had a PCN reaction causing severe rash involving mucus membranes or skin necrosis: No Has patient had a PCN reaction that required  hospitalization No Has patient had a PCN reaction occurring within the last 10 years: No If all of the above answers are "NO", then may proceed with Cephalosporin use.   Shrimp [shellfish Allergy] Anaphylaxis   Just shrimp   Hibiclens [chlorhexidine Gluconate] Itching   Stinging, burning       Medication List    STOP taking these medications   calcium carbonate 500 MG chewable tablet Commonly known as:  TUMS - dosed in mg elemental calcium     TAKE these medications   amLODipine 5 MG tablet Commonly known as:  NORVASC Take 1 tablet (5 mg total) by mouth daily.      Time spent: 31 minutes.  SignedBarnetta Chapel 03/12/2018, 12:18 PM

## 2018-03-12 NOTE — Progress Notes (Signed)
Assessment & Plan: Small bowel obstruction  Clinically improved - tolerating diet, BM yesterday, passing flatus  Encouraged OOB, ambulation  OK for discharge home today per medical service if doing well after breakfast  Follow up with surgery as needed.        Darnell Level, MD       [facility]       Office: [phone]   Chief Complaint: Small bowel obstruction  Subjective: Patient in bed, comfortable, no complaints.  Mild abd discomfort.  No BM this AM, passing flatus.  Objective: Vital signs in last 24 hours: Temp:  [98.4 F (36.9 C)-99 F (37.2 C)] 98.4 F (36.9 C) (01/18 0609) Pulse Rate:  [67-82] 67 (01/18 0609) Resp:  [16-18] 16 (01/18 0609) BP: (146-170)/(84-95) 146/91 (01/18 0609) SpO2:  [97 %-98 %] 97 % (01/18 0609) Weight:  [97.5 kg] 97.5 kg (01/18 0609) Last BM Date: 03/11/18  Intake/Output from previous day: 01/17 0701 - 01/18 0700 In: 1329.8 [P.O.:480; I.V.:849.8] Out: 4250 [Urine:4250] Intake/Output this shift: No intake/output data recorded.  Physical Exam: HEENT - sclerae clear, mucous membranes moist Neck - soft Chest - clear bilaterally Cor - RRR Abdomen - soft without distension; BS present; mild diffuse tenderness; well healed wounds Ext - no edema, non-tender Neuro - alert & oriented, no focal deficits  Lab Results:  Recent Labs    03/10/18 0500 03/11/18 0449  WBC 6.6 7.3  HGB 12.7* 11.9*  HCT 39.5 36.4*  PLT 217 216   BMET Recent Labs    03/10/18 0500 03/11/18 0449  NA 140 139  K 3.9 3.6  CL 106 108  CO2 26 23  GLUCOSE 109* 114*  BUN 11 10  CREATININE 1.25* 1.14  CALCIUM 7.6* 7.7*   PT/INR No results for input(s): LABPROT, INR in the last 72 hours. Comprehensive Metabolic Panel:    Component Value Date/Time   NA 139 03/11/2018 0449   NA 140 03/10/2018 0500   K 3.6 03/11/2018 0449   K 3.9 03/10/2018 0500   CL 108 03/11/2018 0449   CL 106 03/10/2018 0500   CO2 23 03/11/2018 0449   CO2  26 03/10/2018 0500   BUN 10 03/11/2018 0449   BUN 11 03/10/2018 0500   CREATININE 1.14 03/11/2018 0449   CREATININE 1.25 (H) 03/10/2018 0500   GLUCOSE 114 (H) 03/11/2018 0449   GLUCOSE 109 (H) 03/10/2018 0500   CALCIUM 7.7 (L) 03/11/2018 0449   CALCIUM 7.6 (L) 03/10/2018 0500   AST 14 05/11/2017 1019   AST 21 06/12/2015 0826   ALT 15 05/11/2017 1019   ALT 19 06/12/2015 0826   ALKPHOS 47 05/11/2017 1019   ALKPHOS 56 06/12/2015 0826   BILITOT 0.5 05/11/2017 1019   BILITOT 0.6 06/12/2015 0826   PROT 7.2 05/11/2017 1019   PROT 7.6 06/12/2015 0826   ALBUMIN 4.1 05/11/2017 1019   ALBUMIN 4.4 06/12/2015 0826    Studies/Results: Dg Abd 1 View  Result Date: 03/11/2018 CLINICAL DATA:  Follow up small bowel obstruction EXAM: ABDOMEN - 1 VIEW COMPARISON:  03/10/2018 FINDINGS: Contrast material is again noted throughout the colon stable from the previous exam. Small bowel air is noted although no significant dilatation is noted. No bony abnormality is seen. No free air is noted. Nasogastric catheter is seen within the stomach. IMPRESSION: Improvement in small bowel dilatation. Electronically Signed   By: Alcide Clever M.D.   On: 03/11/2018 07:12   US Renal  Result Date: 03/10/2018 CLINICAL DATA:  Acute kidney injury. EXAM: RENAL / URINARY TRACT ULTRASOUND COMPLETE COMPARISON:  KUB 03/10/2018.  CT 03/08/2018, 05/27/2017. FINDINGS: Right Kidney: Renal measurements: 11.9 x 5.4 x 5.9 cm = volume: 198.0 mL . Echogenicity within normal limits. No mass or hydronephrosis visualized. Left Kidney: Renal measurements: 12.3 x 6.3 x 4.8 cm = volume: 194.9 mL. 2.7 x 1.6 x 2.1 cyst with thin septation, most likely benign. Unchanged from prior CT studies. 8 mm nonobstructing stone lower pole. Bladder: Irregular bladder wall.  Bilateral ureteral jets noted. IMPRESSION: 1. Irregular bladder wall. Active bladder pathology including cystitis could present this fashion. 2. 2.7 cm cyst with thin septation noted lower pole  left kidney. This is unchanged from prior CT studies. This is most likely a benign cyst. 3.  8 mm nonobstructing left lower renal pole stone again noted. Electronically Signed   By: Maisie Fushomas  Register   On: 03/10/2018 16:18      Darnell Levelodd Fancy Dunkley 03/12/2018  Patient ID: Gregory Sweeney, male   DOB: 11/25/1960, 58 y.o.   MRN: [id number]

## 2018-03-12 NOTE — Progress Notes (Signed)
PIV removed. Discharge instructions given. Pt denies any questions or concerns. Pt requests note to be able to return to work. Paged Dr. Dartha Lodge and he request that I write note stating pt may return to work when he feels able. Note printed and given to pt.

## 2018-03-14 ENCOUNTER — Telehealth: Payer: Self-pay

## 2018-03-14 NOTE — Telephone Encounter (Signed)
Transition Care Management Follow-up Telephone Call  Admit date: 03/08/2018 Discharge date: 03/12/2018 Principal Problem:  Small bowel obstruction    How have you been since you were released from the hospital? "I'm feeling a lot better"   Do you understand why you were in the hospital? yes, "small bowel obstruction"    Do you understand the discharge instructions? yes   Where were you discharged to? Home.    Items Reviewed:  Medications reviewed: yes, pt not taking prescribed amlodipine  Allergies reviewed: yes  Dietary changes reviewed: yes  Referrals reviewed: yes   Functional Questionnaire:   Activities of Daily Living (ADLs):   He states they are independent in the following: ambulation, bathing and hygiene, feeding, continence, grooming, toileting and dressing States they require assistance with the following: None   Any transportation issues/concerns?: no   Any patient concerns? no, Pt would like to discuss amlodipine with PCP prior to starting. BP at home around 134/70. Advised to monitor BP and bring readings to [facility] f/u appt.    Confirmed importance and date/time of follow-up visits scheduled yes  Provider Appointment booked with PCP 03/17/2018.   Confirmed with patient if condition begins to worsen call PCP or go to the ER.  Patient was given the office number and encouraged to call back with question or concerns.  : yes

## 2018-03-15 ENCOUNTER — Encounter: Payer: Self-pay | Admitting: Physician Assistant

## 2018-03-15 ENCOUNTER — Ambulatory Visit (INDEPENDENT_AMBULATORY_CARE_PROVIDER_SITE_OTHER): Payer: 59 | Admitting: Physician Assistant

## 2018-03-15 ENCOUNTER — Other Ambulatory Visit: Payer: Self-pay

## 2018-03-15 VITALS — BP 132/88 | HR 61 | Temp 98.2°F | Resp 14 | Ht 74.5 in | Wt 212.0 lb

## 2018-03-15 DIAGNOSIS — N179 Acute kidney failure, unspecified: Secondary | ICD-10-CM | POA: Diagnosis not present

## 2018-03-15 DIAGNOSIS — I10 Essential (primary) hypertension: Secondary | ICD-10-CM

## 2018-03-15 DIAGNOSIS — K56609 Unspecified intestinal obstruction, unspecified as to partial versus complete obstruction: Secondary | ICD-10-CM

## 2018-03-15 LAB — COMPREHENSIVE METABOLIC PANEL
ALT: 14 U/L (ref 0–53)
AST: 20 U/L (ref 0–37)
Albumin: 4.1 g/dL (ref 3.5–5.2)
Alkaline Phosphatase: 43 U/L (ref 39–117)
BUN: 13 mg/dL (ref 6–23)
CHLORIDE: 104 meq/L (ref 96–112)
CO2: 28 meq/L (ref 19–32)
Calcium: 9.4 mg/dL (ref 8.4–10.5)
Creatinine, Ser: 1.19 mg/dL (ref 0.40–1.50)
GFR: 75.46 mL/min (ref >60.00)
Glucose, Bld: 85 mg/dL (ref 70–99)
Potassium: 4.2 mEq/L (ref 3.5–5.1)
Sodium: 140 mEq/L (ref 135–145)
Total Bilirubin: 0.6 mg/dL (ref 0.2–1.2)
Total Protein: 6.9 g/dL (ref 6.0–8.3)

## 2018-03-15 LAB — CBC
HCT: 40.9 % (ref 39.0–52.0)
Hemoglobin: 13.8 g/dL (ref 13.0–17.0)
MCHC: 33.7 g/dL (ref 30.0–36.0)
MCV: 88.5 fl (ref 78.0–100.0)
RBC: 4.62 Mil/uL (ref 4.22–5.81)
RDW: 13.9 % (ref 11.5–15.5)
WBC: 8.9 10*3/uL (ref 4.0–10.5)

## 2018-03-15 LAB — TSH: TSH: 2.5 u[IU]/mL (ref 0.35–4.50)

## 2018-03-15 NOTE — Progress Notes (Signed)
Patient presents to clinic today for TCM visit. Patient presented to the ER on 03/08/2018 with c/o abdominal pain, nausea and vomiting. ER workup:  Patient was noted to have significant abdominal distention, further work-up with CT of the abdomen revealed bowel obstruction, NG tube was placed, he received IV fluids and surgery was consulted.  Recommendations for conservative medical therapy. Patient was subsequently admitted to the hospital for further treatment and monitoring. Hospital Course: Patient improved with NG tube. Was removed and patient able to tolerate food orally without issue. Hypertensive episodes noted during admission. As such, patient started on amlodipine 5 mg once daily. Was instructed to check BP up to three times daily and record, bringing to PCP follow-up.  Since discharge on 03/12/18, patient endorses having a daily bowel movement. Still feels mildly constipated. Is not keeping a high fiber diet. Has very poor water intake, drinking mostly coffee and sodas throughout the day. Denies any nausea, vomiting, abdominal pain, tenesmus, melena or hematochezia. Urinary habits are at baseline. He has not taken the amlodipine, stating he does not want to take medication. Patient denies chest pain, palpitations, lightheadedness, dizziness, vision changes or frequent headaches.  BP Readings from Last 3 Encounters:  03/15/18 132/88  03/12/18 (!) 146/91  06/25/17 130/82    Past Medical History:  Diagnosis Date  . Appendicitis   . Bowel obstruction ([place])   . Gastric ulcer   . GERD (gastroesophageal reflux disease)   . History of kidney stones   . Hypertension    not on meds     Current Outpatient Medications on File Prior to Visit  Medication Sig Dispense Refill  . amLODipine (NORVASC) 5 MG tablet Take 1 tablet (5 mg total) by mouth daily. (Patient not taking: Reported on 03/15/2018) 30 tablet 11   No current facility-administered medications on file prior to visit.      Allergies  Allergen Reactions  . Ampicillin Anaphylaxis    DID THE REACTION INVOLVE: Swelling of the face/tongue/throat, SOB, or low BP? Yes Sudden or severe rash/hives, skin peeling, or the inside of the mouth or nose? Yes Did it require medical treatment? Yes When did it last happen?less than 10 yrs If all above answers are "NO", may proceed with cephalosporin use.   . Banana Anaphylaxis  . Latex Hives, Shortness Of Breath, Itching and Rash  . Morphine And Related Hives, Shortness Of Breath, Itching and Nausea And Vomiting    headache  . Other Anaphylaxis    Just pecans  . Penicillins Hives and Shortness Of Breath    Has patient had a PCN reaction causing immediate rash, facial/tongue/throat swelling, SOB or lightheadedness with hypotension: Yes Has patient had a PCN reaction causing severe rash involving mucus membranes or skin necrosis: No Has patient had a PCN reaction that required hospitalization No Has patient had a PCN reaction occurring within the last 10 years: No If all of the above answers are "NO", then may proceed with Cephalosporin use.   . Shrimp [Shellfish Allergy] Anaphylaxis    Just shrimp  . Hibiclens [Chlorhexidine Gluconate] Itching    Stinging, burning     Family History  Problem Relation Age of Onset  . Healthy Mother   . Healthy Father   . Diabetes Paternal Control and instrumentation engineer  . Healthy Brother        x1  . Healthy Sister        x2  . Cancer Neg Hx   .  Heart disease Neg Hx   . Hypercalcemia Neg Hx     Social History   Socioeconomic History  . Marital status: Single    Spouse name: Not on file  . Number of children: 0  . Years of education: Not on file  . Highest education level: Not on file  Occupational History  . Occupation: Buyer, retail: [employer]  . Occupation: Wellsite geologist    Comment: [place]  . Financial resource strain: Not on file  . Food insecurity:    Worry: Not on  file    Inability: Not on file  . Transportation needs:    Medical: Not on file    Non-medical: Not on file  Tobacco Use  . Smoking status: Never Smoker  . Smokeless tobacco: Never Used  Substance and Sexual Activity  . Alcohol use: No    Alcohol/week: 0.0 standard drinks  . Drug use: No  . Sexual activity: Not Currently  Lifestyle  . Physical activity:    Days per week: Not on file    Minutes per session: Not on file  . Stress: Not on file  Relationships  . Social connections:    Talks on phone: Not on file    Gets together: Not on file    Attends religious service: Not on file    Active member of club or organization: Not on file    Attends meetings of clubs or organizations: Not on file    Relationship status: Not on file  Other Topics Concern  . Not on file  Social History Narrative  . Not on file   Review of Systems - See HPI.  All other ROS are negative.  There were no vitals taken for this visit.  Physical Exam Constitutional:      Appearance: Normal appearance.  HENT:     Head: Normocephalic and atraumatic.  Neck:     Musculoskeletal: Normal range of motion and neck supple.  Cardiovascular:     Rate and Rhythm: Normal rate and regular rhythm.     Pulses: Normal pulses.     Heart sounds: Normal heart sounds.  Pulmonary:     Effort: Pulmonary effort is normal.     Breath sounds: Normal breath sounds.  Abdominal:     General: Bowel sounds are normal. There is no distension.     Tenderness: There is abdominal tenderness (mild -- feels like a pulling sensation) in the suprapubic area.     Hernia: No hernia is present.  Neurological:     Mental Status: He is alert.    Recent Results (from the past 2160 hour(s))  Basic metabolic panel     Status: Abnormal   Collection Time: 03/08/18 10:25 AM  Result Value Ref Range   Sodium 140 135 - 145 mmol/L   Potassium 4.4 3.5 - 5.1 mmol/L   Chloride 102 98 - 111 mmol/L   CO2 26 22 - 32 mmol/L   Glucose, Bld 135  (H) 70 - 99 mg/dL   BUN 19 6 - 20 mg/dL   Creatinine, Ser 1.63 (H) 0.61 - 1.24 mg/dL   Calcium 10.6 (H) 8.9 - 10.3 mg/dL   GFR calc non Af Amer 45 (L) >60 mL/min   GFR calc Af Amer 53 (L) >60 mL/min   Anion gap 12 5 - 15    Comment: Performed at [facility], [place] [address], [place], [place] [address]  CBC  Status: Abnormal   Collection Time: 03/08/18 10:25 AM  Result Value Ref Range   WBC 15.7 (H) 4.0 - 10.5 K/uL   RBC 5.64 4.22 - 5.81 MIL/uL   Hemoglobin 16.8 13.0 - 17.0 g/dL   HCT 51.8 39.0 - 52.0 %   MCV 91.8 80.0 - 100.0 fL   MCH 29.8 26.0 - 34.0 pg   MCHC 32.4 30.0 - 36.0 g/dL   RDW 12.9 11.5 - 15.5 %   Platelets 246 150 - 400 K/uL   nRBC 0.0 0.0 - 0.2 %    Comment: Performed at [facility], [place] [address] [place], [place] [address]  Urinalysis, Routine w reflex microscopic     Status: Abnormal   Collection Time: 03/08/18 10:25 AM  Result Value Ref Range   Color, Urine YELLOW YELLOW   APPearance CLOUDY (A) CLEAR   Specific Gravity, Urine 1.023 1.005 - 1.030   pH 5.0 5.0 - 8.0   Glucose, UA NEGATIVE NEGATIVE mg/dL   Hgb urine dipstick NEGATIVE NEGATIVE   Bilirubin Urine NEGATIVE NEGATIVE   Ketones, ur NEGATIVE NEGATIVE mg/dL   Protein, ur 100 (A) NEGATIVE mg/dL   Nitrite NEGATIVE NEGATIVE   Leukocytes, UA NEGATIVE NEGATIVE   RBC / HPF 0-5 0 - 5 RBC/hpf   WBC, UA 0-5 0 - 5 WBC/hpf   Bacteria, UA FEW (A) NONE SEEN   Squamous Epithelial / LPF 0-5 0 - 5   Mucus PRESENT    Hyaline Casts, UA PRESENT    Ca Oxalate Crys, UA PRESENT     Comment: Performed at [facility], [place] [address], [place], [place] [address]  CBG monitoring, ED     Status: Abnormal   Collection Time: 03/08/18 10:37 AM  Result Value Ref Range   Glucose-Capillary 103 (H) 70 - 99 mg/dL  Basic metabolic panel     Status: Abnormal   Collection Time: 03/09/18  4:53 AM  Result Value Ref Range   Sodium 141 135 - 145 mmol/L   Potassium 4.1 3.5  - 5.1 mmol/L   Chloride 106 98 - 111 mmol/L   CO2 25 22 - 32 mmol/L   Glucose, Bld 110 (H) 70 - 99 mg/dL   BUN 22 (H) 6 - 20 mg/dL   Creatinine, Ser 1.45 (H) 0.61 - 1.24 mg/dL   Calcium 8.4 (L) 8.9 - 10.3 mg/dL    Comment: DELTA CHECK NOTED   GFR calc non Af Amer 52 (L) >60 mL/min   GFR calc Af Amer >60 >60 mL/min   Anion gap 10 5 - 15    Comment: Performed at [facility], [place] [address], [place], [place] [address]  CBC     Status: None   Collection Time: 03/09/18  4:53 AM  Result Value Ref Range   WBC 6.4 4.0 - 10.5 K/uL   RBC 4.58 4.22 - 5.81 MIL/uL   Hemoglobin 13.7 13.0 - 17.0 g/dL   HCT 42.6 39.0 - 52.0 %   MCV 93.0 80.0 - 100.0 fL   MCH 29.9 26.0 - 34.0 pg   MCHC 32.2 30.0 - 36.0 g/dL   RDW 13.3 11.5 - 15.5 %   Platelets 211 150 - 400 K/uL   nRBC 0.0 0.0 - 0.2 %    Comment: Performed at [facility], [place] [address], [place],  [address]  HIV Antibody (routine testing w rflx)     Status: None   Collection Time: 03/09/18  4:53 AM  Result Value Ref Range   HIV Screen 4th Generation wRfx Non Reactive Non Reactive    Comment: (NOTE) Performed At: [facility] [place], [place] [id number] Rush Farmer MD [id number]   CBC     Status: Abnormal   Collection Time: 03/10/18  5:00 AM  Result Value Ref Range   WBC 6.6 4.0 - 10.5 K/uL   RBC 4.30 4.22 - 5.81 MIL/uL   Hemoglobin 12.7 (L) 13.0 - 17.0 g/dL   HCT 39.5 39.0 - 52.0 %   MCV 91.9 80.0 - 100.0 fL   MCH 29.5 26.0 - 34.0 pg   MCHC 32.2 30.0 - 36.0 g/dL   RDW 12.8 11.5 - 15.5 %   Platelets 217 150 - 400 K/uL   nRBC 0.0 0.0 - 0.2 %    Comment: Performed at [facility], [place] [address], [place], [place] [address]  Basic metabolic panel     Status: Abnormal   Collection Time: 03/10/18  5:00 AM  Result Value Ref Range   Sodium 140 135 - 145 mmol/L   Potassium 3.9 3.5 - 5.1 mmol/L   Chloride 106 98 - 111 mmol/L   CO2 26 22 - 32  mmol/L   Glucose, Bld 109 (H) 70 - 99 mg/dL   BUN 11 6 - 20 mg/dL   Creatinine, Ser 1.25 (H) 0.61 - 1.24 mg/dL   Calcium 7.6 (L) 8.9 - 10.3 mg/dL   GFR calc non Af Amer >60 >60 mL/min   GFR calc Af Amer >60 >60 mL/min   Anion gap 8 5 - 15    Comment: Performed at [facility], [place] [address] [place], [place] [address]  CBC     Status: Abnormal   Collection Time: 03/11/18  4:49 AM  Result Value Ref Range   WBC 7.3 4.0 - 10.5 K/uL   RBC 4.07 (L) 4.22 - 5.81 MIL/uL   Hemoglobin 11.9 (L) 13.0 - 17.0 g/dL   HCT 36.4 (L) 39.0 - 52.0 %   MCV 89.4 80.0 - 100.0 fL   MCH 29.2 26.0 - 34.0 pg   MCHC 32.7 30.0 - 36.0 g/dL   RDW 12.5 11.5 - 15.5 %   Platelets 216 150 - 400 K/uL   nRBC 0.0 0.0 - 0.2 %    Comment: Performed at [facility], [place] [address], [place], [place] [address]  Basic metabolic panel     Status: Abnormal   Collection Time: 03/11/18  4:49 AM  Result Value Ref Range   Sodium 139 135 - 145 mmol/L   Potassium 3.6 3.5 - 5.1 mmol/L   Chloride 108 98 - 111 mmol/L   CO2 23 22 - 32 mmol/L   Glucose, Bld 114 (H) 70 - 99 mg/dL   BUN 10 6 - 20 mg/dL   Creatinine, Ser 1.14 0.61 - 1.24 mg/dL   Calcium 7.7 (L) 8.9 - 10.3 mg/dL   GFR calc non Af Amer >60 >60 mL/min   GFR calc Af Amer >60 >60 mL/min   Anion gap 8 5 - 15    Comment: Performed at [facility], [place] [address] [place] [address], [place], [place] [address]    Assessment/Plan: 1. SBO (small bowel obstruction) (HCC) Clinically resolved. Mild constipation but patient with poor diet and hydration. Water intake reviewed. Bowel regimen reviewed if needed and handout given. Repeat labs today. Strict ER return precautions reviewed. - Comp Met (CMET) - CBC - TSH  2. AKI (acute  kidney injury) ([place]) Discussed proper diet and hydration. Repeat labs today. - Comp Met (CMET) - CBC  3. Essential hypertension BP borderline today without medication. Will have him check BP at least BID and  record. DASH diet to be started. Handout given. Close follow-up scheduled. If BP >140/90 on multiple measurements, he is to start the amlodipine in addition to TLC. Follow-up 2 weeks.     Leeanne Rio, PA-C

## 2018-03-15 NOTE — Patient Instructions (Addendum)
Please go to the lab today for blood work.  I will call you with your results. We will alter treatment regimen(s) if indicated by your results.   You need to cut coffee and soda consumption in half. You are not drinking enough water by any means. Get a thermos to fill up and sip on throughout the day. We need to get in 48 oz of water a day if possible.   I encourage you to increase hydration and the amount of fiber in your diet.  Start a daily probiotic (Align, Culturelle, Digestive Advantage, etc.). If no bowel movement within 24 hours, take 2 Tbs of Milk of Magnesia in a 4 oz glass of warmed prune juice every 2-3 days to help promote bowel movement. If no results within 24 hours, then repeat above regimen, adding a Dulcolax stool softener to regimen. If this does not promote a bowel movement, please call the office.  For blood pressure, get a home BP cuff to check BP twice daily and record. Keep a low salt diet and keep hydrated. Bring to follow-up visit in 2 weeks for review. If BP spiking again, you will have to start the medication.  Fiber Content in Foods  See the following list for the dietary fiber content of some common foods. High-fiber foods High-fiber foods contain 4 grams or more (4g or more) of fiber per serving. They include:  Artichoke (fresh) - 1 medium has 10.3g of fiber.  Baked beans, plain or vegetarian (canned) -  cup has 5.2g of fiber.  Blackberries or raspberries (fresh) -  cup has 4g of fiber.  Bran cereal -  cup has 8.6g of fiber.  Bulgur (cooked) -  cup has 4g of fiber.  Kidney beans (canned) -  cup has 6.8g of fiber.  Lentils (cooked) -  cup has 7.8g of fiber.  Pear (fresh) - 1 medium has 5.1g of fiber.  Peas (frozen) -  cup has 4.4g of fiber.  Pinto beans (canned) -  cup has 5.5g of fiber.  Pinto beans (dried and cooked) -  cup has 7.7g of fiber.  Potato with skin (baked) - 1 medium has 4.4g of fiber.  Quinoa (cooked) -  cup has 5g of  fiber.  Soybeans (canned, frozen, or fresh) -  cup has 5.1g of fiber. Moderate-fiber foods Moderate-fiber foods contain 1-4 grams (1-4g) of fiber per serving. They include:  Almonds - 1 oz. has 3.5g of fiber.  Apple with skin - 1 medium has 3.3g of fiber.  Applesauce, sweetened -  cup has 1.5g of fiber.  Bagel, plain - one 4-inch (10-cm) bagel has 2g of fiber.  Banana - 1 medium has 3.1g of fiber.  Broccoli (cooked) -  cup has 2.5g of fiber.  Carrots (cooked) -  cup has 2.3g of fiber.  Corn (canned or frozen) -  cup has 2.1g of fiber.  Corn tortilla - one 6-inch (15-cm) tortilla has 1.5g of fiber.  Green beans (canned) -  cup has 2g of fiber.  Instant oatmeal -  cup has about 2g of fiber.  Long-grain brown rice (cooked) - 1 cup has 3.5g of fiber.  Macaroni, enriched (cooked) - 1 cup has 2.5g of fiber.  Melon - 1 cup has 1.4g of fiber.  Multigrain cereal -  cup has about 2-4g of fiber.  Orange - 1 small has 3.1g of fiber.  Potatoes, mashed -  cup has 1.6g of fiber.  Raisins - 1/4 cup has 1.6g of fiber.  Squash -  cup has 2.9g of fiber.  Sunflower seeds -  cup has 1.1g of fiber.  Tomato - 1 medium has 1.5g of fiber.  Vegetable or soy patty - 1 has 3.4g of fiber.  Whole-wheat bread - 1 slice has 2g of fiber.  Whole-wheat spaghetti -  cup has 3.2g of fiber. Low-fiber foods Low-fiber foods contain less than 1 gram (less than 1g) of fiber per serving. They include:  Egg - 1 large.  Flour tortilla - one 6-inch (15-cm) tortilla.  Fruit juice -  cup.  Lettuce - 1 cup.  Meat, poultry, or fish - 1 oz.  Milk - 1 cup.  Spinach (raw) - 1 cup.  White bread - 1 slice.  White rice -  cup.  Yogurt -  cup. Actual amounts of fiber in foods may be different depending on processing. Talk with your dietitian about how much fiber you need in your diet. This information is not intended to replace advice given to you by your health care provider. Make  sure you discuss any questions you have with your health care provider. Document Released: 06/28/2006 Document Revised: 07/18/2015 Document Reviewed: 04/04/2015 Elsevier Interactive Patient Education  2019 [employer].

## 2018-03-17 ENCOUNTER — Inpatient Hospital Stay: Payer: 59 | Admitting: Physician Assistant

## 2018-04-01 ENCOUNTER — Ambulatory Visit (INDEPENDENT_AMBULATORY_CARE_PROVIDER_SITE_OTHER): Payer: 59 | Admitting: Physician Assistant

## 2018-04-01 ENCOUNTER — Other Ambulatory Visit: Payer: Self-pay

## 2018-04-01 ENCOUNTER — Encounter: Payer: Self-pay | Admitting: Physician Assistant

## 2018-04-01 VITALS — BP 160/98 | HR 80 | Temp 98.2°F | Resp 16 | Ht 74.5 in | Wt 214.0 lb

## 2018-04-01 DIAGNOSIS — N401 Enlarged prostate with lower urinary tract symptoms: Secondary | ICD-10-CM

## 2018-04-01 DIAGNOSIS — I10 Essential (primary) hypertension: Secondary | ICD-10-CM | POA: Diagnosis not present

## 2018-04-01 DIAGNOSIS — R3911 Hesitancy of micturition: Secondary | ICD-10-CM | POA: Diagnosis not present

## 2018-04-01 MED ORDER — DOXAZOSIN MESYLATE 2 MG PO TABS: 2.0000 mg | ORAL_TABLET | Freq: Every day | ORAL | 1 refills | Status: DC

## 2018-04-01 NOTE — Progress Notes (Deleted)
Established Patient Office Visit  Subjective:  Patient ID: Gregory Sweeney, male    DOB: 11/17/1960  Age: 58 y.o. MRN: [id number]  CC:  Chief Complaint  Patient presents with  . Hypertension    HPI Gregory Sweeney presents for *** Hypertension - pt did drink a lot of coffee this morning [facility]  Pt is drinking more water (40oz per day) -is making him have to use the restroom a lot more which is difficult d/t job.  Maybe 2 sodas a week- 4 cups a coffee a week  Need clearance for [employer]   Past Medical History:  Diagnosis Date  . Appendicitis   . Bowel obstruction (HCC)   . Gastric ulcer   . GERD (gastroesophageal reflux disease)   . History of kidney stones   . Hypertension    not on meds     Past Surgical History:  Procedure Laterality Date  . APPENDECTOMY    . BOWEL OBSTRUCTION    . CHOLECYSTECTOMY    . ESOPHAGEAL DILATION    . ESOPHAGOGASTRODUODENOSCOPY (EGD) WITH PROPOFOL N/A 07/21/2013   Procedure: ESOPHAGOGASTRODUODENOSCOPY (EGD) WITH PROPOFOL;  Surgeon: Theda BelfastPatrick D Hung, MD;  Location: [facility] ENDOSCOPY;  Service: Endoscopy;  Laterality: N/A;  . KIDNEY STONE SURGERY    . PARATHYROIDECTOMY N/A 12/30/2015   Procedure: NECK EXPLORATION AND PARATHYROIDECTOMY;  Surgeon: Darnell Levelodd Gerkin, MD;  Location: [facility] ORS;  Service: General;  Laterality: N/A;    Family History  Problem Relation Age of Onset  . Healthy Mother   . Healthy Father   . Diabetes Paternal Recruitment consultantAunt        Great Aunt  . Healthy Brother        x1  . Healthy Sister        x2  . Cancer Neg Hx   . Heart disease Neg Hx   . Hypercalcemia Neg Hx     Social History   Socioeconomic History  . Marital status: Single    Spouse name: Not on file  . Number of children: 0  . Years of education: Not on file  . Highest education level: Not on file  Occupational History  . 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 Occupation: Technical brewerepresentative    Comment:  [employer]  Social Needs  . Financial resource strain: Not on file  . Food insecurity:    Worry: Not on file    Inability: Not on file  . Transportation needs:    Medical: Not on file    Non-medical: Not on file  Tobacco Use  . Smoking status: Never Smoker  . Smokeless tobacco: Never Used  Substance and Sexual Activity  . Alcohol use: No    Alcohol/week: 0.0 standard drinks  . Drug use: No  . Sexual activity: Not Currently  Lifestyle  . Physical activity:    Days per week: Not on file    Minutes per session: Not on file  . Stress: Not on file  Relationships  . Social connections:    Talks on phone: Not on file    Gets together: Not on file    Attends religious service: Not on file    Active member of club or organization: Not on file    Attends meetings of clubs or organizations: Not on file    Relationship status: Not on file  . Intimate partner violence:    Fear of current or  ex partner: Not on file    Emotionally abused: Not on file    Physically abused: Not on file    Forced sexual activity: Not on file  Other Topics Concern  . Not on file  Social History Narrative  . Not on file    Outpatient Medications Prior to Visit  Medication Sig Dispense Refill  . amLODipine (NORVASC) 5 MG tablet Take 1 tablet (5 mg total) by mouth daily. (Patient not taking: Reported on 03/15/2018) 30 tablet 11   No facility-administered medications prior to visit.     Allergies  Allergen Reactions  . Ampicillin Anaphylaxis    DID THE REACTION INVOLVE: Swelling of the face/tongue/throat, SOB, or low BP? Yes Sudden or severe rash/hives, skin peeling, or the inside of the mouth or nose? Yes Did it require medical treatment? Yes When did it last happen?less than 10 yrs If all above answers are "NO", may proceed with cephalosporin use.   . Banana Anaphylaxis  . Latex Hives, Shortness Of Breath, Itching and Rash  . Morphine And Related Hives, Shortness Of Breath, Itching and  Nausea And Vomiting    headache  . Other Anaphylaxis    Just pecans  . Penicillins Hives and Shortness Of Breath    Has patient had a PCN reaction causing immediate rash, facial/tongue/throat swelling, SOB or lightheadedness with hypotension: Yes Has patient had a PCN reaction causing severe rash involving mucus membranes or skin necrosis: No Has patient had a PCN reaction that required hospitalization No Has patient had a PCN reaction occurring within the last 10 years: No If all of the above answers are "NO", then may proceed with Cephalosporin use.   . Shrimp [Shellfish Allergy] Anaphylaxis    Just shrimp  . Hibiclens [Chlorhexidine Gluconate] Itching    Stinging, burning     ROS Review of Systems    Objective:    Physical Exam  Constitutional: He is oriented to person, place, and time. He appears well-developed and well-nourished.  HENT:  Head: Normocephalic.  Cardiovascular: Normal rate, regular rhythm and normal heart sounds.  Pulmonary/Chest: Effort normal and breath sounds normal.  Neurological: He is alert and oriented to person, place, and time.    BP (!) 160/98   Pulse 80   Temp 98.2 F (36.8 C) (Oral)   Resp 16   Ht 6' 2.5" (1.892 m)   Wt 97.1 kg   SpO2 98%   BMI 27.11 kg/m  Wt Readings from Last 3 Encounters:  04/01/18 97.1 kg  03/15/18 96.2 kg  03/12/18 97.5 kg     Health Maintenance Due  Topic Date Due  . INFLUENZA VACCINE  09/23/2017    There are no preventive care reminders to display for this patient.  Lab Results  Component Value Date   TSH 2.50 03/15/2018   Lab Results  Component Value Date   WBC 8.9 03/15/2018   HGB 13.8 03/15/2018   HCT 40.9 03/15/2018   MCV 88.5 03/15/2018   PLT 329.0 Repeated and verified X2. 03/15/2018   Lab Results  Component Value Date   NA 140 03/15/2018   K 4.2 03/15/2018   CO2 28 03/15/2018   GLUCOSE 85 03/15/2018   BUN 13 03/15/2018   CREATININE 1.19 03/15/2018   BILITOT 0.6 03/15/2018    ALKPHOS 43 03/15/2018   AST 20 03/15/2018   ALT 14 03/15/2018   PROT 6.9 03/15/2018   ALBUMIN 4.1 03/15/2018   CALCIUM 9.4 03/15/2018   ANIONGAP 8 03/11/2018   GFR  75.46 03/15/2018   Lab Results  Component Value Date   CHOL 178 06/12/2015   Lab Results  Component Value Date   HDL 40.90 06/12/2015   Lab Results  Component Value Date   LDLCALC 121 (H) 06/12/2015   Lab Results  Component Value Date   TRIG 83.0 06/12/2015   Lab Results  Component Value Date   CHOLHDL 4 06/12/2015   Lab Results  Component Value Date   HGBA1C 6.0 06/12/2015      Assessment & Plan:   Problem List Items Addressed This Visit    None      No orders of the defined types were placed in this encounter.   Follow-up: No follow-ups on file.    Nygel Prokop E Lismary Kiehn, Student-PA

## 2018-04-01 NOTE — Progress Notes (Signed)
Patient presents to clinic today for follow-up of hypertension. At last visit patient declined starting the amlodipine given by ER physician and again recommended by this provider. Wanted to keep an eye on home BP measurements and to start on lifestyle changes. Patient endorses drinking plenty of fluids, limiting caffeine and salt, and checking BP 2 x daily. Has home BP log for review. Patient denies chest pain, palpitations, lightheadedness, dizziness, vision changes or frequent headaches.  Patient also with history of LUTS 2/2 mild BPH. Previously declined medication but is interested in starting now. Denies dysuria, urgency or frequency. Notes some ongoing hesitancy and nocturia.   Past Medical History:  Diagnosis Date  . Appendicitis   . Bowel obstruction ([place])   . Gastric ulcer   . GERD (gastroesophageal reflux disease)   . History of kidney stones   . Hypertension    not on meds     Current Outpatient Medications on File Prior to Visit  Medication Sig Dispense Refill  . amLODipine (NORVASC) 5 MG tablet Take 1 tablet (5 mg total) by mouth daily. (Patient not taking: Reported on 03/15/2018) 30 tablet 11   No current facility-administered medications on file prior to visit.     Allergies  Allergen Reactions  . Ampicillin Anaphylaxis    DID THE REACTION INVOLVE: Swelling of the face/tongue/throat, SOB, or low BP? Yes Sudden or severe rash/hives, skin peeling, or the inside of the mouth or nose? Yes Did it require medical treatment? Yes When did it last happen?less than 10 yrs If all above answers are "NO", may proceed with cephalosporin use.   . Banana Anaphylaxis  . Latex Hives, Shortness Of Breath, Itching and Rash  . Morphine And Related Hives, Shortness Of Breath, Itching and Nausea And Vomiting    headache  . Other Anaphylaxis    Just pecans  . Penicillins Hives and Shortness Of Breath    Has patient had a PCN reaction causing immediate rash, facial/tongue/throat  swelling, SOB or lightheadedness with hypotension: Yes Has patient had a PCN reaction causing severe rash involving mucus membranes or skin necrosis: No Has patient had a PCN reaction that required hospitalization No Has patient had a PCN reaction occurring within the last 10 years: No If all of the above answers are "NO", then may proceed with Cephalosporin use.   . Shrimp [Shellfish Allergy] Anaphylaxis    Just shrimp  . Hibiclens [Chlorhexidine Gluconate] Itching    Stinging, burning     Family History  Problem Relation Age of Onset  . Healthy Mother   . Healthy Father   . Diabetes Paternal Control and instrumentation engineer  . Healthy Brother        x1  . Healthy Sister        x2  . Cancer Neg Hx   . Heart disease Neg Hx   . Hypercalcemia Neg Hx     Social History   Socioeconomic History  . Marital status: Single    Spouse name: Not on file  . Number of children: 0  . Years of education: Not on file  . Highest education level: Not on file  Occupational History  . Occupation: Buyer, retail: [employer]  . Occupation: Wellsite geologist    Comment: [place]  . Financial resource strain: Not on file  . Food insecurity:    Worry: Not on file    Inability: Not on file  .  Transportation needs:    Medical: Not on file    Non-medical: Not on file  Tobacco Use  . Smoking status: Never Smoker  . Smokeless tobacco: Never Used  Substance and Sexual Activity  . Alcohol use: No    Alcohol/week: 0.0 standard drinks  . Drug use: No  . Sexual activity: Not Currently  Lifestyle  . Physical activity:    Days per week: Not on file    Minutes per session: Not on file  . Stress: Not on file  Relationships  . Social connections:    Talks on phone: Not on file    Gets together: Not on file    Attends religious service: Not on file    Active member of club or organization: Not on file    Attends meetings of clubs or organizations: Not on file     Relationship status: Not on file  Other Topics Concern  . Not on file  Social History Narrative  . Not on file    Review of Systems - See HPI.  All other ROS are negative.  BP (!) 160/98   Pulse 80   Temp 98.2 F (36.8 C) (Oral)   Resp 16   Ht 6' 2.5" (1.892 m)   Wt 214 lb (97.1 kg)   SpO2 98%   BMI 27.11 kg/m   Physical Exam Vitals signs reviewed.  Constitutional:      Appearance: Normal appearance.  HENT:     Head: Normocephalic and atraumatic.     Right Ear: Tympanic membrane normal.     Nose: Nose normal.  Neck:     Musculoskeletal: Neck supple.  Cardiovascular:     Rate and Rhythm: Normal rate and regular rhythm.     Pulses: Normal pulses.     Heart sounds: Normal heart sounds.  Pulmonary:     Effort: Pulmonary effort is normal.     Breath sounds: Normal breath sounds.  Neurological:     General: No focal deficit present.     Mental Status: He is alert and oriented to person, place, and time.  Psychiatric:        Mood and Affect: Mood normal.     Recent Results (from the past 2160 hour(s))  Basic metabolic panel     Status: Abnormal   Collection Time: 03/08/18 10:25 AM  Result Value Ref Range   Sodium 140 135 - 145 mmol/L   Potassium 4.4 3.5 - 5.1 mmol/L   Chloride 102 98 - 111 mmol/L   CO2 26 22 - 32 mmol/L   Glucose, Bld 135 (H) 70 - 99 mg/dL   BUN 19 6 - 20 mg/dL   Creatinine, Ser 1.63 (H) 0.61 - 1.24 mg/dL   Calcium 10.6 (H) 8.9 - 10.3 mg/dL   GFR calc non Af Amer 45 (L) >60 mL/min   GFR calc Af Amer 53 (L) >60 mL/min   Anion gap 12 5 - 15    Comment: Performed at [facility], [place] [address], [place], [place] [address]  CBC     Status: Abnormal   Collection Time: 03/08/18 10:25 AM  Result Value Ref Range   WBC 15.7 (H) 4.0 - 10.5 K/uL   RBC 5.64 4.22 - 5.81 MIL/uL   Hemoglobin 16.8 13.0 - 17.0 g/dL   HCT 51.8 39.0 - 52.0 %   MCV 91.8 80.0 - 100.0 fL   MCH 29.8 26.0 - 34.0 pg   MCHC 32.4 30.0 - 36.0 g/dL  RDW 12.9  11.5 - 15.5 %   Platelets 246 150 - 400 K/uL   nRBC 0.0 0.0 - 0.2 %    Comment: Performed at [facility], [place] [address], [place], [place] [address]  Urinalysis, Routine w reflex microscopic     Status: Abnormal   Collection Time: 03/08/18 10:25 AM  Result Value Ref Range   Color, Urine YELLOW YELLOW   APPearance CLOUDY (A) CLEAR   Specific Gravity, Urine 1.023 1.005 - 1.030   pH 5.0 5.0 - 8.0   Glucose, UA NEGATIVE NEGATIVE mg/dL   Hgb urine dipstick NEGATIVE NEGATIVE   Bilirubin Urine NEGATIVE NEGATIVE   Ketones, ur NEGATIVE NEGATIVE mg/dL   Protein, ur 100 (A) NEGATIVE mg/dL   Nitrite NEGATIVE NEGATIVE   Leukocytes, UA NEGATIVE NEGATIVE   RBC / HPF 0-5 0 - 5 RBC/hpf   WBC, UA 0-5 0 - 5 WBC/hpf   Bacteria, UA FEW (A) NONE SEEN   Squamous Epithelial / LPF 0-5 0 - 5   Mucus PRESENT    Hyaline Casts, UA PRESENT    Ca Oxalate Crys, UA PRESENT     Comment: Performed at [facility], [place] [address] [place], [place] [address]  CBG monitoring, ED     Status: Abnormal   Collection Time: 03/08/18 10:37 AM  Result Value Ref Range   Glucose-Capillary 103 (H) 70 - 99 mg/dL  Basic metabolic panel     Status: Abnormal   Collection Time: 03/09/18  4:53 AM  Result Value Ref Range   Sodium 141 135 - 145 mmol/L   Potassium 4.1 3.5 - 5.1 mmol/L   Chloride 106 98 - 111 mmol/L   CO2 25 22 - 32 mmol/L   Glucose, Bld 110 (H) 70 - 99 mg/dL   BUN 22 (H) 6 - 20 mg/dL   Creatinine, Ser 1.45 (H) 0.61 - 1.24 mg/dL   Calcium 8.4 (L) 8.9 - 10.3 mg/dL    Comment: DELTA CHECK NOTED   GFR calc non Af Amer 52 (L) >60 mL/min   GFR calc Af Amer >60 >60 mL/min   Anion gap 10 5 - 15    Comment: Performed at [facility], [place] [address] [place], [place] [address]  CBC     Status: None   Collection Time: 03/09/18  4:53 AM  Result Value Ref Range   WBC 6.4 4.0 - 10.5 K/uL   RBC 4.58 4.22 - 5.81 MIL/uL   Hemoglobin 13.7 13.0 - 17.0 g/dL   HCT 42.6  39.0 - 52.0 %   MCV 93.0 80.0 - 100.0 fL   MCH 29.9 26.0 - 34.0 pg   MCHC 32.2 30.0 - 36.0 g/dL   RDW 13.3 11.5 - 15.5 %   Platelets 211 150 - 400 K/uL   nRBC 0.0 0.0 - 0.2 %    Comment: Performed at [facility], [place] [address], [place], [place] [address]  HIV Antibody (routine testing w rflx)     Status: None   Collection Time: 03/09/18  4:53 AM  Result Value Ref Range   HIV Screen 4th Generation wRfx Non Reactive Non Reactive    Comment: (NOTE) Performed At: [facility] [place], [place] [id number] Rush Farmer MD [id number]   CBC     Status: Abnormal   Collection Time: 03/10/18  5:00 AM  Result Value Ref Range   WBC 6.6 4.0 - 10.5 K/uL   RBC 4.30 4.22 -  5.81 MIL/uL   Hemoglobin 12.7 (L) 13.0 - 17.0 g/dL   HCT 39.5 39.0 - 52.0 %   MCV 91.9 80.0 - 100.0 fL   MCH 29.5 26.0 - 34.0 pg   MCHC 32.2 30.0 - 36.0 g/dL   RDW 12.8 11.5 - 15.5 %   Platelets 217 150 - 400 K/uL   nRBC 0.0 0.0 - 0.2 %    Comment: Performed at [facility], [place] [address] [place], [place] [address]  Basic metabolic panel     Status: Abnormal   Collection Time: 03/10/18  5:00 AM  Result Value Ref Range   Sodium 140 135 - 145 mmol/L   Potassium 3.9 3.5 - 5.1 mmol/L   Chloride 106 98 - 111 mmol/L   CO2 26 22 - 32 mmol/L   Glucose, Bld 109 (H) 70 - 99 mg/dL   BUN 11 6 - 20 mg/dL   Creatinine, Ser 1.25 (H) 0.61 - 1.24 mg/dL   Calcium 7.6 (L) 8.9 - 10.3 mg/dL   GFR calc non Af Amer >60 >60 mL/min   GFR calc Af Amer >60 >60 mL/min   Anion gap 8 5 - 15    Comment: Performed at [facility], [place] [address], [place], [place] [address]  CBC     Status: Abnormal   Collection Time: 03/11/18  4:49 AM  Result Value Ref Range   WBC 7.3 4.0 - 10.5 K/uL   RBC 4.07 (L) 4.22 - 5.81 MIL/uL   Hemoglobin 11.9 (L) 13.0 - 17.0 g/dL   HCT 36.4 (L) 39.0 - 52.0 %   MCV 89.4 80.0 - 100.0 fL   MCH 29.2 26.0 - 34.0 pg   MCHC 32.7 30.0 -  36.0 g/dL   RDW 12.5 11.5 - 15.5 %   Platelets 216 150 - 400 K/uL   nRBC 0.0 0.0 - 0.2 %    Comment: Performed at [facility], [place] [address], [place], [place] [address]  Basic metabolic panel     Status: Abnormal   Collection Time: 03/11/18  4:49 AM  Result Value Ref Range   Sodium 139 135 - 145 mmol/L   Potassium 3.6 3.5 - 5.1 mmol/L   Chloride 108 98 - 111 mmol/L   CO2 23 22 - 32 mmol/L   Glucose, Bld 114 (H) 70 - 99 mg/dL   BUN 10 6 - 20 mg/dL   Creatinine, Ser 1.14 0.61 - 1.24 mg/dL   Calcium 7.7 (L) 8.9 - 10.3 mg/dL   GFR calc non Af Amer >60 >60 mL/min   GFR calc Af Amer >60 >60 mL/min   Anion gap 8 5 - 15    Comment: Performed at [facility], [place] [address], [place], [place] [address]  Comp Met (CMET)     Status: None   Collection Time: 03/15/18 10:29 AM  Result Value Ref Range   Sodium 140 135 - 145 mEq/L   Potassium 4.2 3.5 - 5.1 mEq/L   Chloride 104 96 - 112 mEq/L   CO2 28 19 - 32 mEq/L   Glucose, Bld 85 70 - 99 mg/dL   BUN 13 6 - 23 mg/dL   Creatinine, Ser 1.19 0.40 - 1.50 mg/dL   Total Bilirubin 0.6 0.2 - 1.2 mg/dL   Alkaline Phosphatase 43 39 - 117 U/L   AST 20 0 - 37 U/L   ALT 14 0 - 53 U/L   Total Protein 6.9 6.0 - 8.3 g/dL  Albumin 4.1 3.5 - 5.2 g/dL   Calcium 9.4 8.4 - 10.5 mg/dL   GFR 75.46 >60.00 mL/min  CBC     Status: None   Collection Time: 03/15/18 10:29 AM  Result Value Ref Range   WBC 8.9 4.0 - 10.5 K/uL   RBC 4.62 4.22 - 5.81 Mil/uL   Platelets 329.0 Repeated and verified X2. 150.0 - 400.0 K/uL    Comment: Giant Platelets seen on smear.   Hemoglobin 13.8 13.0 - 17.0 g/dL   HCT 40.9 39.0 - 52.0 %   MCV 88.5 78.0 - 100.0 fl   MCHC 33.7 30.0 - 36.0 g/dL   RDW 13.9 11.5 - 15.5 %  TSH     Status: None   Collection Time: 03/15/18 10:29 AM  Result Value Ref Range   TSH 2.50 0.35 - 4.50 uIU/mL    Assessment/Plan: 1. Essential hypertension BP above goal. Rechecked at still at 160/90. Asymptomatic  presently. Has reservations about the amlodipine due to a bad experience a friend had with medication. We are going to start trial of Cardura 2 mg to help with both BP and BPH symptoms. Continue DASH diet. Close follow-up scheduled.   2. Benign prostatic hyperplasia with urinary hesitancy Last PSA within normal limits. Has CPE scheduled in March where we will update this. No worsening of symptoms but is ready to start medication. Start 2 mg Cardura. Follow-up scheduled.    Leeanne Rio, PA-C

## 2018-04-01 NOTE — Patient Instructions (Signed)
Please keep up with low-salt diet and good hydration. Start the Doxazosin as directed daily. This will help with urinary issue and BP. Follow-up with me in 2-3 weeks for reassessment. Can do your physical at that time.   Check BP daily and record.  Call in 1 week with readings.    DASH Eating Plan DASH stands for "Dietary Approaches to Stop Hypertension." The DASH eating plan is a healthy eating plan that has been shown to reduce high blood pressure (hypertension). It may also reduce your risk for type 2 diabetes, heart disease, and stroke. The DASH eating plan may also help with weight loss. What are tips for following this plan?  General guidelines  Avoid eating more than 2,300 mg (milligrams) of salt (sodium) a day. If you have hypertension, you may need to reduce your sodium intake to 1,500 mg a day.  Limit alcohol intake to no more than 1 drink a day for nonpregnant women and 2 drinks a day for men. One drink equals 12 oz of beer, 5 oz of wine, or 1 oz of hard liquor.  Work with your health care provider to maintain a healthy body weight or to lose weight. Ask what an ideal weight is for you.  Get at least 30 minutes of exercise that causes your heart to beat faster (aerobic exercise) most days of the week. Activities may include walking, swimming, or biking.  Work with your health care provider or diet and nutrition specialist (dietitian) to adjust your eating plan to your individual calorie needs. Reading food labels   Check food labels for the amount of sodium per serving. Choose foods with less than 5 percent of the Daily Value of sodium. Generally, foods with less than 300 mg of sodium per serving fit into this eating plan.  To find whole grains, look for the word "whole" as the first word in the ingredient list. Shopping  Buy products labeled as "low-sodium" or "no salt added."  Buy fresh foods. Avoid canned foods and premade or frozen meals. Cooking  Avoid adding salt  when cooking. Use salt-free seasonings or herbs instead of table salt or sea salt. Check with your health care provider or pharmacist before using salt substitutes.  Do not fry foods. Cook foods using healthy methods such as baking, boiling, grilling, and broiling instead.  Cook with heart-healthy oils, such as olive, canola, soybean, or sunflower oil. Meal planning  Eat a balanced diet that includes: ? 5 or more servings of fruits and vegetables each day. At each meal, try to fill half of your plate with fruits and vegetables. ? Up to 6-8 servings of whole grains each day. ? Less than 6 oz of lean meat, poultry, or fish each day. A 3-oz serving of meat is about the same size as a deck of cards. One egg equals 1 oz. ? 2 servings of low-fat dairy each day. ? A serving of nuts, seeds, or beans 5 times each week. ? Heart-healthy fats. Healthy fats called Omega-3 fatty acids are found in foods such as flaxseeds and coldwater fish, like sardines, salmon, and mackerel.  Limit how much you eat of the following: ? Canned or prepackaged foods. ? Food that is high in trans fat, such as fried foods. ? Food that is high in saturated fat, such as fatty meat. ? Sweets, desserts, sugary drinks, and other foods with added sugar. ? Full-fat dairy products.  Do not salt foods before eating.  Try to eat at least  2 vegetarian meals each week.  Eat more home-cooked food and less restaurant, buffet, and fast food.  When eating at a restaurant, ask that your food be prepared with less salt or no salt, if possible. What foods are recommended? The items listed may not be a complete list. Talk with your dietitian about what dietary choices are best for you. Grains Whole-grain or whole-wheat bread. Whole-grain or whole-wheat pasta. Brown rice. [place]. Bulgur. Whole-grain and low-sodium cereals. Pita bread. Low-fat, low-sodium crackers. Whole-wheat flour tortillas. Vegetables Fresh or frozen  vegetables (raw, steamed, roasted, or grilled). Low-sodium or reduced-sodium tomato and vegetable juice. Low-sodium or reduced-sodium tomato sauce and tomato paste. Low-sodium or reduced-sodium canned vegetables. Fruits All fresh, dried, or frozen fruit. Canned fruit in natural juice (without added sugar). Meat and other protein foods Skinless chicken or [place]. Ground chicken or [place]. Pork with fat trimmed off. Fish and seafood. Egg whites. Dried beans, peas, or lentils. Unsalted nuts, nut butters, and seeds. Unsalted canned beans. Lean cuts of beef with fat trimmed off. Low-sodium, lean deli meat. Dairy Low-fat (1%) or fat-free (skim) milk. Fat-free, low-fat, or reduced-fat cheeses. Nonfat, low-sodium ricotta or cottage cheese. Low-fat or nonfat yogurt. Low-fat, low-sodium cheese. Fats and oils Soft margarine without trans fats. Vegetable oil. Low-fat, reduced-fat, or light mayonnaise and salad dressings (reduced-sodium). Canola, safflower, olive, soybean, and sunflower oils. Avocado. Seasoning and other foods Herbs. Spices. Seasoning mixes without salt. Unsalted popcorn and pretzels. Fat-free sweets. What foods are not recommended? The items listed may not be a complete list. Talk with your dietitian about what dietary choices are best for you. Grains Baked goods made with fat, such as croissants, muffins, or some breads. Dry pasta or rice meal packs. Vegetables Creamed or fried vegetables. Vegetables in a cheese sauce. Regular canned vegetables (not low-sodium or reduced-sodium). Regular canned tomato sauce and paste (not low-sodium or reduced-sodium). Regular tomato and vegetable juice (not low-sodium or reduced-sodium). Angie Fava. Olives. Fruits Canned fruit in a light or heavy syrup. Fried fruit. Fruit in cream or butter sauce. Meat and other protein foods Fatty cuts of meat. Ribs. Fried meat. Berniece Salines. Sausage. Bologna and other processed lunch meats. Salami. Fatback. Hotdogs. Bratwurst.  Salted nuts and seeds. Canned beans with added salt. Canned or smoked fish. Whole eggs or egg yolks. Chicken or [place] with skin. Dairy Whole or 2% milk, cream, and half-and-half. Whole or full-fat cream cheese. Whole-fat or sweetened yogurt. Full-fat cheese. Nondairy creamers. Whipped toppings. Processed cheese and cheese spreads. Fats and oils Butter. Stick margarine. Lard. Shortening. Ghee. Bacon fat. Tropical oils, such as coconut, palm kernel, or palm oil. Seasoning and other foods Salted popcorn and pretzels. Onion salt, garlic salt, seasoned salt, table salt, and sea salt. Worcestershire sauce. Tartar sauce. Barbecue sauce. Teriyaki sauce. Soy sauce, including reduced-sodium. Steak sauce. Canned and packaged gravies. Fish sauce. Oyster sauce. Cocktail sauce. Horseradish that you find on the shelf. Ketchup. Mustard. Meat flavorings and tenderizers. Bouillon cubes. Hot sauce and Tabasco sauce. Premade or packaged marinades. Premade or packaged taco seasonings. Relishes. Regular salad dressings. Where to find more information:  National Heart, Lung, and [place]: https://wilson-eaton.com/  American Heart Association: www.heart.org Summary  The DASH eating plan is a healthy eating plan that has been shown to reduce high blood pressure (hypertension). It may also reduce your risk for type 2 diabetes, heart disease, and stroke.  With the DASH eating plan, you should limit salt (sodium) intake to 2,300 mg a day. If you have hypertension, you may  need to reduce your sodium intake to 1,500 mg a day.  When on the DASH eating plan, aim to eat more fresh fruits and vegetables, whole grains, lean proteins, low-fat dairy, and heart-healthy fats.  Work with your health care provider or diet and nutrition specialist (dietitian) to adjust your eating plan to your individual calorie needs. This information is not intended to replace advice given to you by your health care provider. Make sure you discuss any  questions you have with your health care provider. Document Released: 01/29/2011 Document Revised: 02/03/2016 Document Reviewed: 02/03/2016 Elsevier Interactive Patient Education  2019 [employer].

## 2018-04-21 ENCOUNTER — Ambulatory Visit (INDEPENDENT_AMBULATORY_CARE_PROVIDER_SITE_OTHER): Payer: 59 | Admitting: Physician Assistant

## 2018-04-21 ENCOUNTER — Other Ambulatory Visit: Payer: Self-pay

## 2018-04-21 ENCOUNTER — Encounter: Payer: Self-pay | Admitting: Physician Assistant

## 2018-04-21 VITALS — BP 138/86 | HR 67 | Temp 98.2°F | Resp 16 | Ht 76.0 in | Wt 214.0 lb

## 2018-04-21 DIAGNOSIS — E213 Hyperparathyroidism, unspecified: Secondary | ICD-10-CM | POA: Diagnosis not present

## 2018-04-21 DIAGNOSIS — Z125 Encounter for screening for malignant neoplasm of prostate: Secondary | ICD-10-CM | POA: Diagnosis not present

## 2018-04-21 DIAGNOSIS — Z0000 Encounter for general adult medical examination without abnormal findings: Secondary | ICD-10-CM

## 2018-04-21 DIAGNOSIS — I10 Essential (primary) hypertension: Secondary | ICD-10-CM | POA: Diagnosis not present

## 2018-04-21 DIAGNOSIS — R3911 Hesitancy of micturition: Secondary | ICD-10-CM

## 2018-04-21 DIAGNOSIS — N401 Enlarged prostate with lower urinary tract symptoms: Secondary | ICD-10-CM | POA: Diagnosis not present

## 2018-04-21 DIAGNOSIS — Z23 Encounter for immunization: Secondary | ICD-10-CM | POA: Diagnosis not present

## 2018-04-21 LAB — CBC WITH DIFFERENTIAL/PLATELET
Basophils Absolute: 0 10*3/uL (ref 0.0–0.1)
Basophils Relative: 0.6 % (ref 0.0–3.0)
Eosinophils Absolute: 0.4 10*3/uL (ref 0.0–0.7)
Eosinophils Relative: 5.6 % — ABNORMAL HIGH (ref 0.0–5.0)
HCT: 40.5 % (ref 39.0–52.0)
Hemoglobin: 13.9 g/dL (ref 13.0–17.0)
LYMPHS ABS: 1.6 10*3/uL (ref 0.7–4.0)
Lymphocytes Relative: 24.4 % (ref 12.0–46.0)
MCHC: 34.3 g/dL (ref 30.0–36.0)
MCV: 88.8 fl (ref 78.0–100.0)
Monocytes Absolute: 0.5 10*3/uL (ref 0.1–1.0)
Monocytes Relative: 7.9 % (ref 3.0–12.0)
Neutro Abs: 3.9 10*3/uL (ref 1.4–7.7)
Neutrophils Relative %: 61.5 % (ref 43.0–77.0)
Platelets: 237 10*3/uL (ref 150.0–400.0)
RBC: 4.56 Mil/uL (ref 4.22–5.81)
RDW: 14 % (ref 11.5–15.5)
WBC: 6.4 10*3/uL (ref 4.0–10.5)

## 2018-04-21 LAB — LIPID PANEL
Cholesterol: 163 mg/dL (ref 0–200)
HDL: 44.6 mg/dL (ref >39.00)
LDL Cholesterol: 104 mg/dL — ABNORMAL HIGH (ref 0–99)
NonHDL: 118.79
Total CHOL/HDL Ratio: 4
Triglycerides: 75 mg/dL (ref 0.0–149.0)
VLDL: 15 mg/dL (ref 0.0–40.0)

## 2018-04-21 LAB — COMPREHENSIVE METABOLIC PANEL
ALT: 15 U/L (ref 0–53)
AST: 15 U/L (ref 0–37)
Albumin: 4.2 g/dL (ref 3.5–5.2)
Alkaline Phosphatase: 47 U/L (ref 39–117)
BUN: 13 mg/dL (ref 6–23)
CO2: 28 mEq/L (ref 19–32)
Calcium: 9.1 mg/dL (ref 8.4–10.5)
Chloride: 105 mEq/L (ref 96–112)
Creatinine, Ser: 1.34 mg/dL (ref 0.40–1.50)
GFR: 65.78 mL/min (ref >60.00)
Glucose, Bld: 91 mg/dL (ref 70–99)
Potassium: 4.3 mEq/L (ref 3.5–5.1)
Sodium: 140 mEq/L (ref 135–145)
Total Bilirubin: 0.5 mg/dL (ref 0.2–1.2)
Total Protein: 6.9 g/dL (ref 6.0–8.3)

## 2018-04-21 LAB — PSA: PSA: 0.58 ng/mL (ref 0.10–4.00)

## 2018-04-21 LAB — VITAMIN D 25 HYDROXY (VIT D DEFICIENCY, FRACTURES): VITD: 22.45 ng/mL — AB (ref 30.00–100.00)

## 2018-04-21 LAB — HEMOGLOBIN A1C: Hgb A1c MFr Bld: 6 % (ref 4.6–6.5)

## 2018-04-21 LAB — TSH: TSH: 1.27 u[IU]/mL (ref 0.35–4.50)

## 2018-04-21 NOTE — Assessment & Plan Note (Signed)
Significantly improved with Cardura. PSA level today.

## 2018-04-21 NOTE — Assessment & Plan Note (Signed)
BP improved with addition of Cardura for BPH. Continue current regimen. Keep working on diet and exercise. Fasting labs today.

## 2018-04-21 NOTE — Assessment & Plan Note (Signed)
The natural history of prostate cancer and ongoing controversy regarding screening and potential treatment outcomes of prostate cancer has been discussed with the patient. The meaning of a false positive PSA and a false negative PSA has been discussed. He indicates understanding of the limitations of this screening test and wishes  to proceed with screening PSA testing.  

## 2018-04-21 NOTE — Assessment & Plan Note (Signed)
Prior diagnosis in chart. Repeat CMP, PTH.

## 2018-04-21 NOTE — Patient Instructions (Addendum)
-Please go to the lab for blood work.  -Our office will call you with your results unless you have chosen to receive results via MyChart. -If your blood work is normal we will follow-up each year for physicals and as scheduled for chronic medical problems. -If anything is abnormal we will treat accordingly and get you in for a follow-up.  -Continue medications as directed.  -Try to work on increasing aerobic exercise. Goal is for 150 minutes per week. - Get a mask to wear while trying to sleep during the day on long-hauls.    Sleep Hygiene  Do: (1) Go to bed at the same time each day. (2) Get up from bed at the same time each day. (3) Get regular exercise each day, preferably in the morning.  There is goof evidence that regular exercise improves restful sleep.  This includes stretching and aerobic exercise. (4) Get regular exposure to outdoor or bright lights, especially in the late afternoon. (5) Keep the temperature in your bedroom comfortable. (6) Keep the bedroom quiet when sleeping. (7) Keep the bedroom dark enough to facilitate sleep. (8) Use your bed only for sleep and sex. (9) Take medications as directed.  It is helpful to take prescribed sleeping pills 1 hour before bedtime, so they are causing drowsiness when you lie down, or 10 hours before getting up, to avoid daytime drowsiness. (10) Use a relaxation exercise just before going to sleep -- imagery, massage, warm bath. (11) Keep your feet and hands warm.  Wear warm socks and/or mittens or gloves to bed.  Don't: (1) Exercise just before going to bed. (2) Engage in stimulating activity just before bed, such as playing a competitive game, watching an exciting program on television, or having an important discussion with a loved one. (3) Have caffeine in the evening (coffee, teas, chocolate, sodas, etc.) (4) Read or watch television in bed. (5) Use alcohol to help you sleep. (6) Go to bed too hungry or too full. (7) Take  another person's sleeping pills. (8) Take over-the-counter sleeping pills, without your doctor's knowledge.  Tolerance can develop rapidly with these medications.  Diphenhydramine can have serious side effects for elderly patients. (9) Take daytime naps. (10) Command yourself to go to sleep.  This only makes your mind and body more alert.  If you lie awake for more than 20-30 minutes, get up, go to a different room, participate in a quiet activity (Ex - non-excitable reading or television), and then return to bed when you feel sleepy.  Do this as many times during the night as needed.  This may cause you to have a night or two of poor sleep but it will train your brain to know when it is time for sleep.    Preventive Care 40-64 Years, Male Preventive care refers to lifestyle choices and visits with your health care provider that can promote health and wellness. What does preventive care include?   A yearly physical exam. This is also called an annual well check.  Dental exams once or twice a year.  Routine eye exams. Ask your health care provider how often you should have your eyes checked.  Personal lifestyle choices, including: ? Daily care of your teeth and gums. ? Regular physical activity. ? Eating a healthy diet. ? Avoiding tobacco and drug use. ? Limiting alcohol use. ? Practicing safe sex. ? Taking low-dose aspirin every day starting at age 26. What happens during an annual well check? The services and screenings done  by your health care provider during your annual well check will depend on your age, overall health, lifestyle risk factors, and family history of disease. Counseling Your health care provider may ask you questions about your:  Alcohol use.  Tobacco use.  Drug use.  Emotional well-being.  Home and relationship well-being.  Sexual activity.  Eating habits.  Work and work Statistician. Screening You may have the following tests or  measurements:  Height, weight, and BMI.  Blood pressure.  Lipid and cholesterol levels. These may be checked every 5 years, or more frequently if you are over 2 years old.  Skin check.  Lung cancer screening. You may have this screening every year starting at age 79 if you have a 30-pack-year history of smoking and currently smoke or have quit within the past 15 years.  Colorectal cancer screening. All adults should have this screening starting at age 71 and continuing until age 22. Your health care provider may recommend screening at age 51. You will have tests every 1-10 years, depending on your results and the type of screening test. People at increased risk should start screening at an earlier age. Screening tests may include: ? Guaiac-based fecal occult blood testing. ? Fecal immunochemical test (FIT). ? Stool DNA test. ? Virtual colonoscopy. ? Sigmoidoscopy. During this test, a flexible tube with a tiny camera (sigmoidoscope) is used to examine your rectum and lower colon. The sigmoidoscope is inserted through your anus into your rectum and lower colon. ? Colonoscopy. During this test, a long, thin, flexible tube with a tiny camera (colonoscope) is used to examine your entire colon and rectum.  Prostate cancer screening. Recommendations will vary depending on your family history and other risks.  Hepatitis C blood test.  Hepatitis B blood test.  Sexually transmitted disease (STD) testing.  Diabetes screening. This is done by checking your blood sugar (glucose) after you have not eaten for a while (fasting). You may have this done every 1-3 years. Discuss your test results, treatment options, and if necessary, the need for more tests with your health care provider. Vaccines Your health care provider may recommend certain vaccines, such as:  Influenza vaccine. This is recommended every year.  Tetanus, diphtheria, and acellular pertussis (Tdap, Td) vaccine. You may need a Td  booster every 10 years.  Varicella vaccine. You may need this if you have not been vaccinated.  Zoster vaccine. You may need this after age 68.  Measles, mumps, and rubella (MMR) vaccine. You may need at least one dose of MMR if you were born in 1957 or later. You may also need a second dose.  Pneumococcal 13-valent conjugate (PCV13) vaccine. You may need this if you have certain conditions and have not been vaccinated.  Pneumococcal polysaccharide (PPSV23) vaccine. You may need one or two doses if you smoke cigarettes or if you have certain conditions.  Meningococcal vaccine. You may need this if you have certain conditions.  Hepatitis A vaccine. You may need this if you have certain conditions or if you travel or work in places where you may be exposed to hepatitis A.  Hepatitis B vaccine. You may need this if you have certain conditions or if you travel or work in places where you may be exposed to hepatitis B.  Haemophilus influenzae type b (Hib) vaccine. You may need this if you have certain risk factors. Talk to your health care provider about which screenings and vaccines you need and how often you need them. This information  is not intended to replace advice given to you by your health care provider. Make sure you discuss any questions you have with your health care provider. Document Released: 03/08/2015 Document Revised: 04/01/2017 Document Reviewed: 12/11/2014 Elsevier Interactive Patient Education  2019 [employer].

## 2018-04-21 NOTE — Assessment & Plan Note (Signed)
Depression screen negative. Health Maintenance reviewed. Preventive schedule discussed and handout given in AVS. Will obtain fasting labs today.  

## 2018-04-21 NOTE — Progress Notes (Signed)
Patient presents to clinic today for annual exam.  Patient is fasting for labs.  Acute Concerns: Patient denies acute concerns today.   Chronic Issues: BPH -- Placed on Doxazosin at last visit. Endorses taking as directed and tolerating well. Notes significant improvement with medication. Feels like he has a great stream. Notes complete bladder emptying.  Hypertension -- Is currently on a regimen of Doxazosin due to BPH. Also keeping low salt diet. Notes taking medication as directed. Patient denies chest pain, palpitations, lightheadedness, dizziness, vision changes or frequent headaches. Is working on exercise. Notes Home BP in the 110s-120s since starting medication. Has not had his medication yet today.Marland Kitchen  Health Maintenance: Immunizations -- Agrees to flu shot today. Colonoscopy -- up-to-date.  Past Medical History:  Diagnosis Date  . Appendicitis   . Bowel obstruction ([place])   . Gastric ulcer   . GERD (gastroesophageal reflux disease)   . History of kidney stones   . Hypertension    not on meds     Past Surgical History:  Procedure Laterality Date  . APPENDECTOMY    . BOWEL OBSTRUCTION    . CHOLECYSTECTOMY    . ESOPHAGEAL DILATION    . ESOPHAGOGASTRODUODENOSCOPY (EGD) WITH PROPOFOL N/A 07/21/2013   Procedure: ESOPHAGOGASTRODUODENOSCOPY (EGD) WITH PROPOFOL;  Surgeon: Beryle Beams, MD;  Location: [facility];  Service: Endoscopy;  Laterality: N/A;  . KIDNEY STONE SURGERY    . PARATHYROIDECTOMY N/A 12/30/2015   Procedure: NECK EXPLORATION AND PARATHYROIDECTOMY;  Surgeon: Armandina Gemma, MD;  Location: [facility];  Service: General;  Laterality: N/A;    Current Outpatient Medications on File Prior to Visit  Medication Sig Dispense Refill  . doxazosin (CARDURA) 2 MG tablet Take 1 tablet (2 mg total) by mouth daily. 30 tablet 1   No current facility-administered medications on file prior to visit.     Allergies  Allergen Reactions  . Ampicillin Anaphylaxis    DID THE  REACTION INVOLVE: Swelling of the face/tongue/throat, SOB, or low BP? Yes Sudden or severe rash/hives, skin peeling, or the inside of the mouth or nose? Yes Did it require medical treatment? Yes When did it last happen?less than 10 yrs If all above answers are "NO", may proceed with cephalosporin use.   . Banana Anaphylaxis  . Latex Hives, Shortness Of Breath, Itching and Rash  . Morphine And Related Hives, Shortness Of Breath, Itching and Nausea And Vomiting    headache  . Other Anaphylaxis    Just pecans  . Penicillins Hives and Shortness Of Breath    Has patient had a PCN reaction causing immediate rash, facial/tongue/throat swelling, SOB or lightheadedness with hypotension: Yes Has patient had a PCN reaction causing severe rash involving mucus membranes or skin necrosis: No Has patient had a PCN reaction that required hospitalization No Has patient had a PCN reaction occurring within the last 10 years: No If all of the above answers are "NO", then may proceed with Cephalosporin use.   . Shrimp [Shellfish Allergy] Anaphylaxis    Just shrimp  . Hibiclens [Chlorhexidine Gluconate] Itching    Stinging, burning     Family History  Problem Relation Age of Onset  . Healthy Mother   . Healthy Father   . Diabetes Paternal Control and instrumentation engineer  . Healthy Brother        x1  . Healthy Sister        x2  . Cancer Neg Hx   . Heart  disease Neg Hx   . Hypercalcemia Neg Hx     Social History   Socioeconomic History  . Marital status: Single    Spouse name: Not on file  . Number of children: 0  . Years of education: Not on file  . Highest education level: Not on file  Occupational History  . Occupation: Buyer, retail: [employer]  . Occupation: Wellsite geologist    Comment: [employer]  . Financial resource strain: Not on file  . Food insecurity:    Worry: Not on file    Inability: Not on file  . Transportation needs:    Medical: Not on  file    Non-medical: Not on file  Tobacco Use  . Smoking status: Never Smoker  . Smokeless tobacco: Never Used  Substance and Sexual Activity  . Alcohol use: No    Alcohol/week: 0.0 standard drinks  . Drug use: No  . Sexual activity: Not Currently  Lifestyle  . Physical activity:    Days per week: Not on file    Minutes per session: Not on file  . Stress: Not on file  Relationships  . Social connections:    Talks on phone: Not on file    Gets together: Not on file    Attends religious service: Not on file    Active member of club or organization: Not on file    Attends meetings of clubs or organizations: Not on file    Relationship status: Not on file  . Intimate partner violence:    Fear of current or ex partner: Not on file    Emotionally abused: Not on file    Physically abused: Not on file    Forced sexual activity: Not on file  Other Topics Concern  . Not on file  Social History Narrative  . Not on file    Review of Systems  Constitutional: Negative for fever and weight loss.  HENT: Negative for ear discharge, ear pain, hearing loss and tinnitus.   Eyes: Negative for blurred vision, double vision, photophobia and pain.  Respiratory: Negative for cough and shortness of breath.   Cardiovascular: Negative for chest pain and palpitations.  Gastrointestinal: Negative for abdominal pain, blood in stool, constipation, diarrhea, heartburn, melena, nausea and vomiting.  Genitourinary: Negative for dysuria, flank pain, frequency, hematuria and urgency.  Musculoskeletal: Negative for falls.  Neurological: Negative for dizziness, loss of consciousness and headaches.  Endo/Heme/Allergies: Negative for environmental allergies.  Psychiatric/Behavioral: Negative for depression, hallucinations, substance abuse and suicidal ideas. The patient is not nervous/anxious and does not have insomnia.    BP 138/86   Pulse 67   Temp 98.2 F (36.8 C) (Oral)   Resp 16   Ht _0  (1.93 m)    Wt 214 lb (97.1 kg)   SpO2 98%   BMI 26.05 kg/m   Physical Exam Vitals signs reviewed.  Constitutional:      General: He is not in acute distress.    Appearance: He is well-developed. He is not diaphoretic.  HENT:     Head: Normocephalic and atraumatic.     Right Ear: Tympanic membrane, ear canal and external ear normal.     Left Ear: Tympanic membrane, ear canal and external ear normal.     Nose: Nose normal.     Mouth/Throat:     Pharynx: No posterior oropharyngeal erythema.  Eyes:     Conjunctiva/sclera: Conjunctivae normal.     Pupils: Pupils  are equal, round, and reactive to light.  Neck:     Musculoskeletal: Neck supple.     Thyroid: No thyromegaly.  Cardiovascular:     Rate and Rhythm: Normal rate and regular rhythm.     Heart sounds: Normal heart sounds.  Pulmonary:     Effort: Pulmonary effort is normal. No respiratory distress.     Breath sounds: Normal breath sounds. No wheezing or rales.  Chest:     Chest wall: No tenderness.  Abdominal:     General: Bowel sounds are normal. There is no distension.     Palpations: Abdomen is soft. There is no mass.     Tenderness: There is no abdominal tenderness. There is no guarding or rebound.  Lymphadenopathy:     Cervical: No cervical adenopathy.  Skin:    General: Skin is warm and dry.     Findings: No rash.  Neurological:     Mental Status: He is alert and oriented to person, place, and time.     Cranial Nerves: No cranial nerve deficit.     Recent Results (from the past 2160 hour(s))  Basic metabolic panel     Status: Abnormal   Collection Time: 03/08/18 10:25 AM  Result Value Ref Range   Sodium 140 135 - 145 mmol/L   Potassium 4.4 3.5 - 5.1 mmol/L   Chloride 102 98 - 111 mmol/L   CO2 26 22 - 32 mmol/L   Glucose, Bld 135 (H) 70 - 99 mg/dL   BUN 19 6 - 20 mg/dL   Creatinine, Ser 1.63 (H) 0.61 - 1.24 mg/dL   Calcium 10.6 (H) 8.9 - 10.3 mg/dL   GFR calc non Af Amer 45 (L) >60 mL/min   GFR calc Af Amer 53  (L) >60 mL/min   Anion gap 12 5 - 15    Comment: Performed at [facility], [place] [address], [place], [place] [address]  CBC     Status: Abnormal   Collection Time: 03/08/18 10:25 AM  Result Value Ref Range   WBC 15.7 (H) 4.0 - 10.5 K/uL   RBC 5.64 4.22 - 5.81 MIL/uL   Hemoglobin 16.8 13.0 - 17.0 g/dL   HCT 51.8 39.0 - 52.0 %   MCV 91.8 80.0 - 100.0 fL   MCH 29.8 26.0 - 34.0 pg   MCHC 32.4 30.0 - 36.0 g/dL   RDW 12.9 11.5 - 15.5 %   Platelets 246 150 - 400 K/uL   nRBC 0.0 0.0 - 0.2 %    Comment: Performed at [facility], [place] [address], [place], [place] [address]  Urinalysis, Routine w reflex microscopic     Status: Abnormal   Collection Time: 03/08/18 10:25 AM  Result Value Ref Range   Color, Urine YELLOW YELLOW   APPearance CLOUDY (A) CLEAR   Specific Gravity, Urine 1.023 1.005 - 1.030   pH 5.0 5.0 - 8.0   Glucose, UA NEGATIVE NEGATIVE mg/dL   Hgb urine dipstick NEGATIVE NEGATIVE   Bilirubin Urine NEGATIVE NEGATIVE   Ketones, ur NEGATIVE NEGATIVE mg/dL   Protein, ur 100 (A) NEGATIVE mg/dL   Nitrite NEGATIVE NEGATIVE   Leukocytes, UA NEGATIVE NEGATIVE   RBC / HPF 0-5 0 - 5 RBC/hpf   WBC, UA 0-5 0 - 5 WBC/hpf   Bacteria, UA FEW (A) NONE SEEN   Squamous Epithelial / LPF 0-5 0 - 5   Mucus PRESENT    Hyaline Casts, UA PRESENT    Ca Oxalate  Crys, UA PRESENT     Comment: Performed at [facility], [place] [address] [place], [place] [address]  CBG monitoring, ED     Status: Abnormal   Collection Time: 03/08/18 10:37 AM  Result Value Ref Range   Glucose-Capillary 103 (H) 70 - 99 mg/dL  Basic metabolic panel     Status: Abnormal   Collection Time: 03/09/18  4:53 AM  Result Value Ref Range   Sodium 141 135 - 145 mmol/L   Potassium 4.1 3.5 - 5.1 mmol/L   Chloride 106 98 - 111 mmol/L   CO2 25 22 - 32 mmol/L   Glucose, Bld 110 (H) 70 - 99 mg/dL   BUN 22 (H) 6 - 20 mg/dL   Creatinine, Ser 1.45 (H) 0.61 - 1.24 mg/dL    Calcium 8.4 (L) 8.9 - 10.3 mg/dL    Comment: DELTA CHECK NOTED   GFR calc non Af Amer 52 (L) >60 mL/min   GFR calc Af Amer >60 >60 mL/min   Anion gap 10 5 - 15    Comment: Performed at [facility], [place] [address] [place], [place] [address]  CBC     Status: None   Collection Time: 03/09/18  4:53 AM  Result Value Ref Range   WBC 6.4 4.0 - 10.5 K/uL   RBC 4.58 4.22 - 5.81 MIL/uL   Hemoglobin 13.7 13.0 - 17.0 g/dL   HCT 42.6 39.0 - 52.0 %   MCV 93.0 80.0 - 100.0 fL   MCH 29.9 26.0 - 34.0 pg   MCHC 32.2 30.0 - 36.0 g/dL   RDW 13.3 11.5 - 15.5 %   Platelets 211 150 - 400 K/uL   nRBC 0.0 0.0 - 0.2 %    Comment: Performed at [facility], [place] [address], [place], [place] [address]  HIV Antibody (routine testing w rflx)     Status: None   Collection Time: 03/09/18  4:53 AM  Result Value Ref Range   HIV Screen 4th Generation wRfx Non Reactive Non Reactive    Comment: (NOTE) Performed At: [facility] [place], [place] [id number] Rush Farmer MD OZ:[id number]   CBC     Status: Abnormal   Collection Time: 03/10/18  5:00 AM  Result Value Ref Range   WBC 6.6 4.0 - 10.5 K/uL   RBC 4.30 4.22 - 5.81 MIL/uL   Hemoglobin 12.7 (L) 13.0 - 17.0 g/dL   HCT 39.5 39.0 - 52.0 %   MCV 91.9 80.0 - 100.0 fL   MCH 29.5 26.0 - 34.0 pg   MCHC 32.2 30.0 - 36.0 g/dL   RDW 12.8 11.5 - 15.5 %   Platelets 217 150 - 400 K/uL   nRBC 0.0 0.0 - 0.2 %    Comment: Performed at [facility], [place] [address] [place], [place] [address]  Basic metabolic panel     Status: Abnormal   Collection Time: 03/10/18  5:00 AM  Result Value Ref Range   Sodium 140 135 - 145 mmol/L   Potassium 3.9 3.5 - 5.1 mmol/L   Chloride 106 98 - 111 mmol/L   CO2 26 22 - 32 mmol/L   Glucose, Bld 109 (H) 70 - 99 mg/dL   BUN 11 6 - 20 mg/dL   Creatinine, Ser 1.25 (H) 0.61 - 1.24 mg/dL   Calcium 7.6 (L) 8.9 - 10.3 mg/dL   GFR calc non Af Amer >60 >60  mL/min   GFR calc  Af Amer >60 >60 mL/min   Anion gap 8 5 - 15    Comment: Performed at [facility], [place] [address], [place], [place] [address]  CBC     Status: Abnormal   Collection Time: 03/11/18  4:49 AM  Result Value Ref Range   WBC 7.3 4.0 - 10.5 K/uL   RBC 4.07 (L) 4.22 - 5.81 MIL/uL   Hemoglobin 11.9 (L) 13.0 - 17.0 g/dL   HCT 36.4 (L) 39.0 - 52.0 %   MCV 89.4 80.0 - 100.0 fL   MCH 29.2 26.0 - 34.0 pg   MCHC 32.7 30.0 - 36.0 g/dL   RDW 12.5 11.5 - 15.5 %   Platelets 216 150 - 400 K/uL   nRBC 0.0 0.0 - 0.2 %    Comment: Performed at [facility], [place] [address], [place], [place] [address]  Basic metabolic panel     Status: Abnormal   Collection Time: 03/11/18  4:49 AM  Result Value Ref Range   Sodium 139 135 - 145 mmol/L   Potassium 3.6 3.5 - 5.1 mmol/L   Chloride 108 98 - 111 mmol/L   CO2 23 22 - 32 mmol/L   Glucose, Bld 114 (H) 70 - 99 mg/dL   BUN 10 6 - 20 mg/dL   Creatinine, Ser 1.14 0.61 - 1.24 mg/dL   Calcium 7.7 (L) 8.9 - 10.3 mg/dL   GFR calc non Af Amer >60 >60 mL/min   GFR calc Af Amer >60 >60 mL/min   Anion gap 8 5 - 15    Comment: Performed at [facility], [place] [address], [place], [place] [address]  Comp Met (CMET)     Status: None   Collection Time: 03/15/18 10:29 AM  Result Value Ref Range   Sodium 140 135 - 145 mEq/L   Potassium 4.2 3.5 - 5.1 mEq/L   Chloride 104 96 - 112 mEq/L   CO2 28 19 - 32 mEq/L   Glucose, Bld 85 70 - 99 mg/dL   BUN 13 6 - 23 mg/dL   Creatinine, Ser 1.19 0.40 - 1.50 mg/dL   Total Bilirubin 0.6 0.2 - 1.2 mg/dL   Alkaline Phosphatase 43 39 - 117 U/L   AST 20 0 - 37 U/L   ALT 14 0 - 53 U/L   Total Protein 6.9 6.0 - 8.3 g/dL   Albumin 4.1 3.5 - 5.2 g/dL   Calcium 9.4 8.4 - 10.5 mg/dL   GFR 75.46 >60.00 mL/min  CBC     Status: None   Collection Time: 03/15/18 10:29 AM  Result Value Ref Range   WBC 8.9 4.0 - 10.5 K/uL   RBC 4.62 4.22 - 5.81 Mil/uL   Platelets 329.0  Repeated and verified X2. 150.0 - 400.0 K/uL    Comment: Giant Platelets seen on smear.   Hemoglobin 13.8 13.0 - 17.0 g/dL   HCT 40.9 39.0 - 52.0 %   MCV 88.5 78.0 - 100.0 fl   MCHC 33.7 30.0 - 36.0 g/dL   RDW 13.9 11.5 - 15.5 %  TSH     Status: None   Collection Time: 03/15/18 10:29 AM  Result Value Ref Range   TSH 2.50 0.35 - 4.50 uIU/mL    Assessment/Plan: Visit for preventive health examination Depression screen negative. Health Maintenance reviewed. Preventive schedule discussed and handout given in AVS. Will obtain fasting labs today.   Prostate cancer screening The natural history of prostate cancer and ongoing controversy regarding  screening and potential treatment outcomes of prostate cancer has been discussed with the patient. The meaning of a false positive PSA and a false negative PSA has been discussed. He indicates understanding of the limitations of this screening test and wishes to proceed with screening PSA testing.   Hyperparathyroidism ([place]) Prior diagnosis in chart. Repeat CMP, PTH.  Essential hypertension BP improved with addition of Cardura for BPH. Continue current regimen. Keep working on diet and exercise. Fasting labs today.  Benign prostatic hyperplasia with urinary hesitancy Significantly improved with Cardura. PSA level today.    Leeanne Rio, PA-C

## 2018-04-22 LAB — PARATHYROID HORMONE, INTACT (NO CA): PTH: 26 pg/mL (ref 14–64)

## 2018-04-26 ENCOUNTER — Other Ambulatory Visit: Payer: Self-pay | Admitting: Physician Assistant

## 2018-04-26 DIAGNOSIS — E559 Vitamin D deficiency, unspecified: Secondary | ICD-10-CM

## 2018-04-26 MED ORDER — VITAMIN D (ERGOCALCIFEROL) 1.25 MG (50000 UNIT) PO CAPS: ORAL_CAPSULE | ORAL | 0 refills | Status: DC

## 2018-04-26 MED ORDER — VITAMIN D3 25 MCG (1000 UNIT) PO TABS: 1000.0000 [IU] | ORAL_TABLET | Freq: Every day | ORAL | 0 refills | Status: DC

## 2018-05-21 ENCOUNTER — Other Ambulatory Visit: Payer: Self-pay | Admitting: Physician Assistant

## 2018-07-13 ENCOUNTER — Other Ambulatory Visit: Payer: Self-pay | Admitting: Physician Assistant

## 2018-07-13 DIAGNOSIS — E559 Vitamin D deficiency, unspecified: Secondary | ICD-10-CM

## 2018-08-04 ENCOUNTER — Encounter: Payer: Self-pay | Admitting: Physician Assistant

## 2018-08-04 LAB — HM COLONOSCOPY

## 2018-08-14 ENCOUNTER — Other Ambulatory Visit: Payer: Self-pay | Admitting: Physician Assistant

## 2018-09-21 ENCOUNTER — Other Ambulatory Visit: Payer: Self-pay | Admitting: Gastroenterology

## 2018-09-27 ENCOUNTER — Other Ambulatory Visit (HOSPITAL_COMMUNITY): Payer: 59 | Attending: Gastroenterology

## 2018-09-30 ENCOUNTER — Ambulatory Visit (HOSPITAL_COMMUNITY): Admission: RE | Admit: 2018-09-30 | Payer: 59 | Source: Home / Self Care | Admitting: Gastroenterology

## 2018-09-30 ENCOUNTER — Encounter (HOSPITAL_COMMUNITY): Admission: RE | Payer: Self-pay | Source: Home / Self Care

## 2018-09-30 SURGERY — ESOPHAGOGASTRODUODENOSCOPY (EGD) WITH PROPOFOL
Anesthesia: Monitor Anesthesia Care

## 2018-12-06 ENCOUNTER — Other Ambulatory Visit: Payer: Self-pay

## 2018-12-06 ENCOUNTER — Ambulatory Visit (INDEPENDENT_AMBULATORY_CARE_PROVIDER_SITE_OTHER): Payer: 59 | Admitting: Physician Assistant

## 2018-12-06 ENCOUNTER — Encounter: Payer: Self-pay | Admitting: Physician Assistant

## 2018-12-06 VITALS — BP 150/84 | HR 80 | Temp 98.0°F | Resp 16 | Ht 76.0 in | Wt 209.0 lb

## 2018-12-06 DIAGNOSIS — Z23 Encounter for immunization: Secondary | ICD-10-CM | POA: Diagnosis not present

## 2018-12-06 DIAGNOSIS — N401 Enlarged prostate with lower urinary tract symptoms: Secondary | ICD-10-CM | POA: Diagnosis not present

## 2018-12-06 DIAGNOSIS — K5900 Constipation, unspecified: Secondary | ICD-10-CM

## 2018-12-06 DIAGNOSIS — R3911 Hesitancy of micturition: Secondary | ICD-10-CM | POA: Diagnosis not present

## 2018-12-06 LAB — POCT URINALYSIS DIPSTICK
Blood, UA: NEGATIVE
Glucose, UA: NEGATIVE
Ketones, UA: NEGATIVE
Leukocytes, UA: NEGATIVE
Nitrite, UA: NEGATIVE
Protein, UA: NEGATIVE
Spec Grav, UA: 1.025 (ref 1.010–1.025)
Urobilinogen, UA: 0.2 E.U./dL
pH, UA: 6 (ref 5.0–8.0)

## 2018-12-06 MED ORDER — TAMSULOSIN HCL 0.4 MG PO CAPS: 0.4000 mg | ORAL_CAPSULE | Freq: Every day | ORAL | 3 refills | Status: DC

## 2018-12-06 NOTE — Patient Instructions (Signed)
Please keep well-hydrated Avoid excess amounts of caffeine as this is a bladder irritant. Start the Flomax and take as directed. You need to hydrate well before taking medication. I recommend taking in the evening after dinner.  I encourage you to increase hydration and the amount of fiber in your diet.  Start a daily probiotic (Align, Culturelle, Digestive Advantage, etc.). If no bowel movement within 24 hours, take 2 Tbs of Milk of Magnesia in a 4 oz glass of warmed prune juice every 2-3 days to help promote bowel movement. If no results within 24 hours, then repeat above regimen, adding a Dulcolax stool softener to regimen. If this does not promote a bowel movement, please call the office.  Follow-up with me in 3-4 weeks for reassessment. If not improving significantly we will get you in with Urology.  Also you will be contacted for assessment by Dermatology for the suspected keratosis pilaris

## 2018-12-06 NOTE — Progress Notes (Signed)
Patient presents to clinic today c/o intermittent episodes of bilateral flank and back pain, sometimes positional but not always. Pain feels like a fullness with occasional aching. Does improve with change in position and with using the bathroom -- sometimes urination but always with BM. Notes constipation, having 1 BM every 3 days. Usually harder and having to strain. Denies hematochezia, melena or tenesmus. Does not hydrate well. Patient also with history of BPH, previously on Cardura. Stopped even though it worked very well due to headache. Notes again he did not hydrate as directed while taking medication. Notes decreased stream. Notes sensation of incomplete bladder emptying. PSAs have been in normal range.   Lab Results  Component Value Date   PSA 0.58 04/21/2018   PSA 0.66 05/11/2017   PSA 0.75 03/10/2016    Past Medical History:  Diagnosis Date  . Appendicitis   . Bowel obstruction (HCC)   . Gastric ulcer   . GERD (gastroesophageal reflux disease)   . History of kidney stones   . Hypertension    not on meds     Current Outpatient Medications on File Prior to Visit  Medication Sig Dispense Refill  . cholecalciferol (VITAMIN D) 25 MCG (1000 UT) tablet Take 1 tablet (1,000 Units total) by mouth daily. 30 tablet 0  . Vitamin D, Ergocalciferol, (DRISDOL) 1.25 MG (50000 UT) CAPS capsule Take 1 capsule by mouth once a week for 12 weeks 12 capsule 0  . doxazosin (CARDURA) 2 MG tablet TAKE 1 TABLET BY MOUTH EVERY DAY (Patient not taking: Reported on 12/06/2018) 90 tablet 1   No current facility-administered medications on file prior to visit.     Allergies  Allergen Reactions  . Ampicillin Anaphylaxis    DID THE REACTION INVOLVE: Swelling of the face/tongue/throat, SOB, or low BP? Yes Sudden or severe rash/hives, skin peeling, or the inside of the mouth or nose? Yes Did it require medical treatment? Yes When did it last happen?less than 10 yrs If all above answers are  "NO", may proceed with cephalosporin use.   . Banana Anaphylaxis  . Latex Hives, Shortness Of Breath, Itching and Rash  . Morphine And Related Hives, Shortness Of Breath, Itching and Nausea And Vomiting    headache  . Other Anaphylaxis    Just pecans  . Penicillins Hives and Shortness Of Breath    Has patient had a PCN reaction causing immediate rash, facial/tongue/throat swelling, SOB or lightheadedness with hypotension: Yes Has patient had a PCN reaction causing severe rash involving mucus membranes or skin necrosis: No Has patient had a PCN reaction that required hospitalization No Has patient had a PCN reaction occurring within the last 10 years: No If all of the above answers are "NO", then may proceed with Cephalosporin use.   . Shrimp [Shellfish Allergy] Anaphylaxis    Just shrimp  . Hibiclens [Chlorhexidine Gluconate] Itching    Stinging, burning     Family History  Problem Relation Age of Onset  . Healthy Mother   . Healthy Father   . Diabetes Paternal Recruitment consultant  . Healthy Brother        x1  . Healthy Sister        x2  . Cancer Neg Hx   . Heart disease Neg Hx   . Hypercalcemia Neg Hx     Social History   Socioeconomic History  . Marital status: Single    Spouse name: Not on file  .  Number of children: 0  . Years of education: Not on file  . Highest education level: Not on file  Occupational History  . Occupation: Buyer, retail: [employer]  . Occupation: Wellsite geologist    Comment: [place]  . Financial resource strain: Not on file  . Food insecurity    Worry: Not on file    Inability: Not on file  . Transportation needs    Medical: Not on file    Non-medical: Not on file  Tobacco Use  . Smoking status: Never Smoker  . Smokeless tobacco: Never Used  Substance and Sexual Activity  . Alcohol use: No    Alcohol/week: 0.0 standard drinks  . Drug use: No  . Sexual activity: Not Currently  Lifestyle   . Physical activity    Days per week: Not on file    Minutes per session: Not on file  . Stress: Not on file  Relationships  . Social Herbalist on phone: Not on file    Gets together: Not on file    Attends religious service: Not on file    Active member of club or organization: Not on file    Attends meetings of clubs or organizations: Not on file    Relationship status: Not on file  Other Topics Concern  . Not on file  Social History Narrative  . Not on file   Review of Systems - See HPI.  All other ROS are negative.  Wt 209 lb (94.8 kg)   BMI 25.44 kg/m   Physical Exam Vitals signs reviewed.  Constitutional:      Appearance: Normal appearance.  HENT:     Head: Normocephalic and atraumatic.     Right Ear: Tympanic membrane normal.     Left Ear: Tympanic membrane normal.     Nose: Nose normal.  Eyes:     Conjunctiva/sclera: Conjunctivae normal.  Neck:     Musculoskeletal: Neck supple.  Cardiovascular:     Rate and Rhythm: Normal rate and regular rhythm.     Pulses: Normal pulses.     Heart sounds: Normal heart sounds.  Pulmonary:     Effort: Pulmonary effort is normal.     Breath sounds: Normal breath sounds.  Abdominal:     General: Bowel sounds are normal. There is no distension.     Palpations: Abdomen is soft.     Tenderness: There is no abdominal tenderness.  Neurological:     General: No focal deficit present.     Mental Status: He is alert and oriented to person, place, and time.  Psychiatric:        Mood and Affect: Mood normal.    Assessment/Plan: 1. Benign prostatic hyperplasia with urinary hesitancy Recent PSA within normal limits. Increase fluids. Will start trial of Flomax once daily. Follow-up 3-4 weeks. UA unremarkable today for infection. Suspect pain is combination of pressure on bladder due to enlarged prostate and constipation. Will treat these and see how symptom of pain respond.   2. Constipation, unspecified constipation  type Increase fluids. Bowel regimen reviewed with patient. Close follow-up scheduled. - POCT Urinalysis Dipstick  3. Need for immunization against influenza - Flu Vaccine QUAD 36+ mos IM   Leeanne Rio, PA-C

## 2019-01-03 ENCOUNTER — Other Ambulatory Visit: Payer: Self-pay

## 2019-01-03 ENCOUNTER — Encounter: Payer: Self-pay | Admitting: Physician Assistant

## 2019-01-03 ENCOUNTER — Ambulatory Visit (INDEPENDENT_AMBULATORY_CARE_PROVIDER_SITE_OTHER): Payer: 59 | Admitting: Physician Assistant

## 2019-01-03 DIAGNOSIS — N401 Enlarged prostate with lower urinary tract symptoms: Secondary | ICD-10-CM | POA: Diagnosis not present

## 2019-01-03 DIAGNOSIS — I10 Essential (primary) hypertension: Secondary | ICD-10-CM | POA: Diagnosis not present

## 2019-01-03 DIAGNOSIS — R3911 Hesitancy of micturition: Secondary | ICD-10-CM

## 2019-01-03 DIAGNOSIS — R21 Rash and other nonspecific skin eruption: Secondary | ICD-10-CM

## 2019-01-03 NOTE — Progress Notes (Signed)
I have discussed the procedure for the virtual visit with the patient who has given consent to proceed with assessment and treatment.   Amee Boothe S Umi Mainor, CMA     

## 2019-01-03 NOTE — Patient Instructions (Addendum)
Instructions sent to MyChart.   Please keep hydrated and limit salt intake (see diet below). Continue the Flomax as directed.  Get a BP Cuff to check BP periodically while at home and on the road (when stopped of course).  Record these and let me know the readings via MyChart in 2-3 weeks. This way we can make further changes to your antihypertensive regimen and keep BP where it needs to be.  Hang in there!   DASH Eating Plan DASH stands for "Dietary Approaches to Stop Hypertension." The DASH eating plan is a healthy eating plan that has been shown to reduce high blood pressure (hypertension). It may also reduce your risk for type 2 diabetes, heart disease, and stroke. The DASH eating plan may also help with weight loss. What are tips for following this plan?  General guidelines  Avoid eating more than 2,300 mg (milligrams) of salt (sodium) a day. If you have hypertension, you may need to reduce your sodium intake to 1,500 mg a day.  Limit alcohol intake to no more than 1 drink a day for nonpregnant women and 2 drinks a day for men. One drink equals 12 oz of beer, 5 oz of wine, or 1 oz of hard liquor.  Work with your health care provider to maintain a healthy body weight or to lose weight. Ask what an ideal weight is for you.  Get at least 30 minutes of exercise that causes your heart to beat faster (aerobic exercise) most days of the week. Activities may include walking, swimming, or biking.  Work with your health care provider or diet and nutrition specialist (dietitian) to adjust your eating plan to your individual calorie needs. Reading food labels   Check food labels for the amount of sodium per serving. Choose foods with less than 5 percent of the Daily Value of sodium. Generally, foods with less than 300 mg of sodium per serving fit into this eating plan.  To find whole grains, look for the word "whole" as the first word in the ingredient list. Shopping  Buy products labeled  as "low-sodium" or "no salt added."  Buy fresh foods. Avoid canned foods and premade or frozen meals. Cooking  Avoid adding salt when cooking. Use salt-free seasonings or herbs instead of table salt or sea salt. Check with your health care provider or pharmacist before using salt substitutes.  Do not fry foods. Cook foods using healthy methods such as baking, boiling, grilling, and broiling instead.  Cook with heart-healthy oils, such as olive, canola, soybean, or sunflower oil. Meal planning  Eat a balanced diet that includes: ? 5 or more servings of fruits and vegetables each day. At each meal, try to fill half of your plate with fruits and vegetables. ? Up to 6-8 servings of whole grains each day. ? Less than 6 oz of lean meat, poultry, or fish each day. A 3-oz serving of meat is about the same size as a deck of cards. One egg equals 1 oz. ? 2 servings of low-fat dairy each day. ? A serving of nuts, seeds, or beans 5 times each week. ? Heart-healthy fats. Healthy fats called Omega-3 fatty acids are found in foods such as flaxseeds and coldwater fish, like sardines, salmon, and mackerel.  Limit how much you eat of the following: ? Canned or prepackaged foods. ? Food that is high in trans fat, such as fried foods. ? Food that is high in saturated fat, such as fatty meat. ? Sweets, desserts,  sugary drinks, and other foods with added sugar. ? Full-fat dairy products.  Do not salt foods before eating.  Try to eat at least 2 vegetarian meals each week.  Eat more home-cooked food and less restaurant, buffet, and fast food.  When eating at a restaurant, ask that your food be prepared with less salt or no salt, if possible. What foods are recommended? The items listed may not be a complete list. Talk with your dietitian about what dietary choices are best for you. Grains Whole-grain or whole-wheat bread. Whole-grain or whole-wheat pasta. Brown rice. [place]row. Bulgur. Whole-grain  and low-sodium cereals. Pita bread. Low-fat, low-sodium crackers. Whole-wheat flour tortillas. Vegetables Fresh or frozen vegetables (raw, steamed, roasted, or grilled). Low-sodium or reduced-sodium tomato and vegetable juice. Low-sodium or reduced-sodium tomato sauce and tomato paste. Low-sodium or reduced-sodium canned vegetables. Fruits All fresh, dried, or frozen fruit. Canned fruit in natural juice (without added sugar). Meat and other protein foods Skinless chicken or [place]. Ground chicken or [place]. Pork with fat trimmed off. Fish and seafood. Egg whites. Dried beans, peas, or lentils. Unsalted nuts, nut butters, and seeds. Unsalted canned beans. Lean cuts of beef with fat trimmed off. Low-sodium, lean deli meat. Dairy Low-fat (1%) or fat-free (skim) milk. Fat-free, low-fat, or reduced-fat cheeses. Nonfat, low-sodium ricotta or cottage cheese. Low-fat or nonfat yogurt. Low-fat, low-sodium cheese. Fats and oils Soft margarine without trans fats. Vegetable oil. Low-fat, reduced-fat, or light mayonnaise and salad dressings (reduced-sodium). Canola, safflower, olive, soybean, and sunflower oils. Avocado. Seasoning and other foods Herbs. Spices. Seasoning mixes without salt. Unsalted popcorn and pretzels. Fat-free sweets. What foods are not recommended? The items listed may not be a complete list. Talk with your dietitian about what dietary choices are best for you. Grains Baked goods made with fat, such as croissants, muffins, or some breads. Dry pasta or rice meal packs. Vegetables Creamed or fried vegetables. Vegetables in a cheese sauce. Regular canned vegetables (not low-sodium or reduced-sodium). Regular canned tomato sauce and paste (not low-sodium or reduced-sodium). Regular tomato and vegetable juice (not low-sodium or reduced-sodium). Angie Fava. Olives. Fruits Canned fruit in a light or heavy syrup. Fried fruit. Fruit in cream or butter sauce. Meat and other protein foods Fatty cuts  of meat. Ribs. Fried meat. Berniece Salines. Sausage. Bologna and other processed lunch meats. Salami. Fatback. Hotdogs. Bratwurst. Salted nuts and seeds. Canned beans with added salt. Canned or smoked fish. Whole eggs or egg yolks. Chicken or [place] with skin. Dairy Whole or 2% milk, cream, and half-and-half. Whole or full-fat cream cheese. Whole-fat or sweetened yogurt. Full-fat cheese. Nondairy creamers. Whipped toppings. Processed cheese and cheese spreads. Fats and oils Butter. Stick margarine. Lard. Shortening. Ghee. Bacon fat. Tropical oils, such as coconut, palm kernel, or palm oil. Seasoning and other foods Salted popcorn and pretzels. Onion salt, garlic salt, seasoned salt, table salt, and sea salt. Worcestershire sauce. Tartar sauce. Barbecue sauce. Teriyaki sauce. Soy sauce, including reduced-sodium. Steak sauce. Canned and packaged gravies. Fish sauce. Oyster sauce. Cocktail sauce. Horseradish that you find on the shelf. Ketchup. Mustard. Meat flavorings and tenderizers. Bouillon cubes. Hot sauce and Tabasco sauce. Premade or packaged marinades. Premade or packaged taco seasonings. Relishes. Regular salad dressings. Where to find more information:  National Heart, Lung, and [place]: https://[place]-e[place].com/  American Heart Association: www.heart.org Summary  The DASH eating plan is a healthy eating plan that has been shown to reduce high blood pressure (hypertension). It may also reduce your risk for type 2 diabetes, heart disease,  and stroke.  With the DASH eating plan, you should limit salt (sodium) intake to 2,300 mg a day. If you have hypertension, you may need to reduce your sodium intake to 1,500 mg a day.  When on the DASH eating plan, aim to eat more fresh fruits and vegetables, whole grains, lean proteins, low-fat dairy, and heart-healthy fats.  Work with your health care provider or diet and nutrition specialist (dietitian) to adjust your eating plan to your individual calorie  needs. This information is not intended to replace advice given to you by your health care provider. Make sure you discuss any questions you have with your health care provider. Document Released: 01/29/2011 Document Revised: 01/22/2017 Document Reviewed: 02/03/2016 Elsevier Patient Education  2020 [employer].

## 2019-01-03 NOTE — Progress Notes (Signed)
Virtual Visit via Video   I connected with patient on 01/03/19 at  8:00 AM EST by a video enabled telemedicine application and verified that I am speaking with the correct person using two identifiers.  Location patient: Home Location provider: [facility], Office Persons participating in the virtual visit: Patient, Provider, PA-Student Arby Barrette), CMA (Rene Paci)  I discussed the limitations of evaluation and management by telemedicine and the availability of in person appointments. The patient expressed understanding and agreed to proceed.  Subjective:   HPI:   Patient presents via [facility] for c/o BPH. Was started on Flomax 0.4 mg QD on 10/13, notes improvement to urinary symptoms with the medication when he takes it. Large improvement in urinary hesitancy and stream. 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 Endorses occasional headaches. Has been trying to stay hydrated. Notes improvement to constipation. Denies chest pain, palpitations, lightheadedness, dizziness.   ROS:   See pertinent positives and negatives per HPI.  Patient Active Problem List   Diagnosis Date Noted  . Benign prostatic hyperplasia with urinary hesitancy 04/21/2018  . History of small bowel obstruction 06/14/2017  . Hyperparathyroidism (HCC) 08/02/2015  . Visit for preventive health examination 06/12/2015  . Prostate cancer screening 06/12/2015  . Essential hypertension 10/21/2014    Social History   Tobacco Use  . Smoking status: Never Smoker  . Smokeless tobacco: Never Used  Substance Use Topics  . Alcohol use: No    Alcohol/week: 0.0 standard drinks    Current Outpatient Medications:  .  cholecalciferol (VITAMIN D) 25 MCG (1000 UT) tablet, Take 1 tablet (1,000 Units total) by mouth daily., Disp: 30 tablet, Rfl: 0 .  doxazosin (CARDURA) 2 MG tablet,  TAKE 1 TABLET BY MOUTH EVERY DAY (Patient not taking: Reported on 12/06/2018), Disp: 90 tablet, Rfl: 1 .  tamsulosin (FLOMAX) 0.4 MG CAPS capsule, Take 1 capsule (0.4 mg total) by mouth daily., Disp: 30 capsule, Rfl: 3 .  Vitamin D, Ergocalciferol, (DRISDOL) 1.25 MG (50000 UT) CAPS capsule, Take 1 capsule by mouth once a week for 12 weeks, Disp: 12 capsule, Rfl: 0  Allergies  Allergen Reactions  . Ampicillin Anaphylaxis    DID THE REACTION INVOLVE: Swelling of the face/tongue/throat, SOB, or low BP? Yes Sudden or severe rash/hives, skin peeling, or the inside of the mouth or nose? Yes Did it require medical treatment? Yes When did it last happen?less than 10 yrs If all above answers are "NO", may proceed with cephalosporin use.   . Banana Anaphylaxis  . Latex Hives, Shortness Of Breath, Itching and Rash  . Morphine And Related Hives, Shortness Of Breath, Itching and Nausea And Vomiting    headache  . Other Anaphylaxis    Just pecans  . Penicillins Hives and Shortness Of Breath    Has patient had a PCN reaction causing immediate rash, facial/tongue/throat swelling, SOB or lightheadedness with hypotension: Yes Has patient had a PCN reaction causing severe rash involving mucus membranes or skin necrosis: No Has patient had a PCN reaction that required hospitalization No Has patient had a PCN reaction occurring within the last 10 years: No If all of the above answers are "NO", then may proceed with Cephalosporin use.   . Shrimp [Shellfish Allergy] Anaphylaxis    Just shrimp  . Hibiclens [Chlorhexidine Gluconate] Itching    Stinging, burning     Objective:  There were no vitals taken for this visit.  Patient is well-developed, well-nourished in no acute distress.  Resting comfortably on bed at home.  Head is normocephalic, atraumatic.  No labored breathing.  Speech is clear and coherent with logical content.  Patient is alert and oriented at baseline.   Assessment and  Plan:   1. Benign prostatic hyperplasia with urinary hesitancy Improvement in stream and emptying with Flomax. Discussed timing of medication to help prevent drowsiness. Discussed change to Rapaflo but patient declines today. Wants to continue with current regimen.   2. Essential hypertension Encouraged patient to get a BP cuff to periodically monitor BP levels due to history of HTN so we can determine if anything else needs to be added.   2. Rash Noted at end of visit. Patient requests referral to Dermatology for recurring rash. Referral placed for patient today at end of visit.  - Ambulatory referral to Dermatology   Leeanne Rio, PA-C 01/03/2019

## 2019-01-24 ENCOUNTER — Telehealth: Payer: Self-pay | Admitting: Emergency Medicine

## 2019-01-24 DIAGNOSIS — R3911 Hesitancy of micturition: Secondary | ICD-10-CM

## 2019-01-24 DIAGNOSIS — N401 Enlarged prostate with lower urinary tract symptoms: Secondary | ICD-10-CM

## 2019-01-24 NOTE — Telephone Encounter (Signed)
Patient called in today stating he has been taking the Flomax daily as directed but the last 2-3 days having episodes of unable to make it to the bathroom. Has urinary urgency.  When he doesn't take the Flomax does not have the problem.  His blood pressure has been running better When he takes the medication daily the urgency occurs. 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

## 2019-01-25 NOTE — Telephone Encounter (Signed)
Would stop the medication. Recommend Urology assessment.

## 2019-01-25 NOTE — Telephone Encounter (Signed)
Advised patient of PCP recommendations of stopping the Flomax and referral to Urology. Patient is agreeable. Referral placed

## 2019-03-03 ENCOUNTER — Telehealth: Payer: Self-pay | Admitting: Physician Assistant

## 2019-03-03 DIAGNOSIS — R21 Rash and other nonspecific skin eruption: Secondary | ICD-10-CM

## 2019-03-03 NOTE — Telephone Encounter (Signed)
New referral placed. Alcario Drought or Raynelle Fanning, can you work on this so we can hopefully get him in ASAP.

## 2019-03-03 NOTE — Telephone Encounter (Signed)
Office is closed today I will reach out Monday to try to get pt scheduled for an appt.

## 2019-03-03 NOTE — Telephone Encounter (Signed)
Pt called, said about 2 months ago he was supposed to be referred to Dermatology but has not received any calls. I did not see a derm referral on file, pt states their was some discussion about ongoing skin issues.

## 2019-04-10 ENCOUNTER — Other Ambulatory Visit: Payer: Self-pay

## 2019-04-10 MED ORDER — TAMSULOSIN HCL 0.4 MG PO CAPS: 0.4000 mg | ORAL_CAPSULE | Freq: Every day | ORAL | 3 refills | Status: DC

## 2019-04-25 ENCOUNTER — Encounter: Payer: 59 | Admitting: Physician Assistant

## 2019-04-28 ENCOUNTER — Encounter: Payer: Self-pay | Admitting: Physician Assistant

## 2019-04-28 ENCOUNTER — Other Ambulatory Visit: Payer: Self-pay

## 2019-04-28 ENCOUNTER — Ambulatory Visit (INDEPENDENT_AMBULATORY_CARE_PROVIDER_SITE_OTHER): Payer: 59 | Admitting: Physician Assistant

## 2019-04-28 VITALS — BP 140/100 | HR 74 | Temp 97.9°F | Resp 14 | Ht 75.25 in | Wt 215.0 lb

## 2019-04-28 DIAGNOSIS — N401 Enlarged prostate with lower urinary tract symptoms: Secondary | ICD-10-CM

## 2019-04-28 DIAGNOSIS — I10 Essential (primary) hypertension: Secondary | ICD-10-CM | POA: Diagnosis not present

## 2019-04-28 DIAGNOSIS — R3911 Hesitancy of micturition: Secondary | ICD-10-CM

## 2019-04-28 DIAGNOSIS — Z125 Encounter for screening for malignant neoplasm of prostate: Secondary | ICD-10-CM

## 2019-04-28 DIAGNOSIS — Z0000 Encounter for general adult medical examination without abnormal findings: Secondary | ICD-10-CM

## 2019-04-28 LAB — CBC WITH DIFFERENTIAL/PLATELET
Basophils Absolute: 0.1 10*3/uL (ref 0.0–0.1)
Basophils Relative: 1.1 % (ref 0.0–3.0)
Eosinophils Absolute: 0.3 10*3/uL (ref 0.0–0.7)
Eosinophils Relative: 4.9 % (ref 0.0–5.0)
HCT: 42.2 % (ref 39.0–52.0)
Hemoglobin: 14.3 g/dL (ref 13.0–17.0)
Lymphocytes Relative: 26.8 % (ref 12.0–46.0)
Lymphs Abs: 1.7 10*3/uL (ref 0.7–4.0)
MCHC: 33.9 g/dL (ref 30.0–36.0)
MCV: 89.3 fl (ref 78.0–100.0)
Monocytes Absolute: 0.4 10*3/uL (ref 0.1–1.0)
Monocytes Relative: 6.9 % (ref 3.0–12.0)
Neutro Abs: 3.8 10*3/uL (ref 1.4–7.7)
Neutrophils Relative %: 60.3 % (ref 43.0–77.0)
Platelets: 231 10*3/uL (ref 150.0–400.0)
RBC: 4.73 Mil/uL (ref 4.22–5.81)
RDW: 14.4 % (ref 11.5–15.5)
WBC: 6.4 10*3/uL (ref 4.0–10.5)

## 2019-04-28 LAB — LIPID PANEL
Cholesterol: 169 mg/dL (ref 0–200)
HDL: 41.2 mg/dL (ref >39.00)
LDL Cholesterol: 106 mg/dL — ABNORMAL HIGH (ref 0–99)
NonHDL: 127.31
Total CHOL/HDL Ratio: 4
Triglycerides: 105 mg/dL (ref 0.0–149.0)
VLDL: 21 mg/dL (ref 0.0–40.0)

## 2019-04-28 LAB — HEMOGLOBIN A1C: Hgb A1c MFr Bld: 6 % (ref 4.6–6.5)

## 2019-04-28 LAB — COMPREHENSIVE METABOLIC PANEL
ALT: 13 U/L (ref 0–53)
AST: 14 U/L (ref 0–37)
Albumin: 4 g/dL (ref 3.5–5.2)
Alkaline Phosphatase: 48 U/L (ref 39–117)
BUN: 12 mg/dL (ref 6–23)
CO2: 26 mEq/L (ref 19–32)
Calcium: 9.3 mg/dL (ref 8.4–10.5)
Chloride: 104 mEq/L (ref 96–112)
Creatinine, Ser: 1.18 mg/dL (ref 0.40–1.50)
GFR: 75.92 mL/min (ref >60.00)
Glucose, Bld: 91 mg/dL (ref 70–99)
Potassium: 4.4 mEq/L (ref 3.5–5.1)
Sodium: 138 mEq/L (ref 135–145)
Total Bilirubin: 0.6 mg/dL (ref 0.2–1.2)
Total Protein: 6.9 g/dL (ref 6.0–8.3)

## 2019-04-28 LAB — PSA: PSA: 0.48 ng/mL (ref 0.10–4.00)

## 2019-04-28 NOTE — Patient Instructions (Signed)
Please go to the lab for blood work.   Our office will call you with your results unless you have chosen to receive results via MyChart.  If your blood work is normal we will follow-up each year for physicals and as scheduled for chronic medical problems.  If anything is abnormal we will treat accordingly and get you in for a follow-up.  Please check BP daily at home and record over the next week. Message me with these readings.  If remaining elevated we will need to restart medication. Keep up with a low sodium diet.    DASH Eating Plan DASH stands for "Dietary Approaches to Stop Hypertension." The DASH eating plan is a healthy eating plan that has been shown to reduce high blood pressure (hypertension). It may also reduce your risk for type 2 diabetes, heart disease, and stroke. The DASH eating plan may also help with weight loss. What are tips for following this plan?  General guidelines  Avoid eating more than 2,300 mg (milligrams) of salt (sodium) a day. If you have hypertension, you may need to reduce your sodium intake to 1,500 mg a day.  Limit alcohol intake to no more than 1 drink a day for nonpregnant women and 2 drinks a day for men. One drink equals 12 oz of beer, 5 oz of wine, or 1 oz of hard liquor.  Work with your health care provider to maintain a healthy body weight or to lose weight. Ask what an ideal weight is for you.  Get at least 30 minutes of exercise that causes your heart to beat faster (aerobic exercise) most days of the week. Activities may include walking, swimming, or biking.  Work with your health care provider or diet and nutrition specialist (dietitian) to adjust your eating plan to your individual calorie needs. Reading food labels   Check food labels for the amount of sodium per serving. Choose foods with less than 5 percent of the Daily Value of sodium. Generally, foods with less than 300 mg of sodium per serving fit into this eating plan.  To  find whole grains, look for the word "whole" as the first word in the ingredient list. Shopping  Buy products labeled as "low-sodium" or "no salt added."  Buy fresh foods. Avoid canned foods and premade or frozen meals. Cooking  Avoid adding salt when cooking. Use salt-free seasonings or herbs instead of table salt or sea salt. Check with your health care provider or pharmacist before using salt substitutes.  Do not fry foods. Cook foods using healthy methods such as baking, boiling, grilling, and broiling instead.  Cook with heart-healthy oils, such as olive, canola, soybean, or sunflower oil. Meal planning  Eat a balanced diet that includes: ? 5 or more servings of fruits and vegetables each day. At each meal, try to fill half of your plate with fruits and vegetables. ? Up to 6-8 servings of whole grains each day. ? Less than 6 oz of lean meat, poultry, or fish each day. A 3-oz serving of meat is about the same size as a deck of cards. One egg equals 1 oz. ? 2 servings of low-fat dairy each day. ? A serving of nuts, seeds, or beans 5 times each week. ? Heart-healthy fats. Healthy fats called Omega-3 fatty acids are found in foods such as flaxseeds and coldwater fish, like sardines, salmon, and mackerel.  Limit how much you eat of the following: ? Canned or prepackaged foods. ? Food that is high  in trans fat, such as fried foods. ? Food that is high in saturated fat, such as fatty meat. ? Sweets, desserts, sugary drinks, and other foods with added sugar. ? Full-fat dairy products.  Do not salt foods before eating.  Try to eat at least 2 vegetarian meals each week.  Eat more home-cooked food and less restaurant, buffet, and fast food.  When eating at a restaurant, ask that your food be prepared with less salt or no salt, if possible. What foods are recommended? The items listed may not be a complete list. Talk with your dietitian about what dietary choices are best for  you. Grains Whole-grain or whole-wheat bread. Whole-grain or whole-wheat pasta. Brown rice. [place]. Bulgur. Whole-grain and low-sodium cereals. Pita bread. Low-fat, low-sodium crackers. Whole-wheat flour tortillas. Vegetables Fresh or frozen vegetables (raw, steamed, roasted, or grilled). Low-sodium or reduced-sodium tomato and vegetable juice. Low-sodium or reduced-sodium tomato sauce and tomato paste. Low-sodium or reduced-sodium canned vegetables. Fruits All fresh, dried, or frozen fruit. Canned fruit in natural juice (without added sugar). Meat and other protein foods Skinless chicken or [place]. Ground chicken or [place]. Pork with fat trimmed off. Fish and seafood. Egg whites. Dried beans, peas, or lentils. Unsalted nuts, nut butters, and seeds. Unsalted canned beans. Lean cuts of beef with fat trimmed off. Low-sodium, lean deli meat. Dairy Low-fat (1%) or fat-free (skim) milk. Fat-free, low-fat, or reduced-fat cheeses. Nonfat, low-sodium ricotta or cottage cheese. Low-fat or nonfat yogurt. Low-fat, low-sodium cheese. Fats and oils Soft margarine without trans fats. Vegetable oil. Low-fat, reduced-fat, or light mayonnaise and salad dressings (reduced-sodium). Canola, safflower, olive, soybean, and sunflower oils. Avocado. Seasoning and other foods Herbs. Spices. Seasoning mixes without salt. Unsalted popcorn and pretzels. Fat-free sweets. What foods are not recommended? The items listed may not be a complete list. Talk with your dietitian about what dietary choices are best for you. Grains Baked goods made with fat, such as croissants, muffins, or some breads. Dry pasta or rice meal packs. Vegetables Creamed or fried vegetables. Vegetables in a cheese sauce. Regular canned vegetables (not low-sodium or reduced-sodium). Regular canned tomato sauce and paste (not low-sodium or reduced-sodium). Regular tomato and vegetable juice (not low-sodium or reduced-sodium). Angie Fava.  Olives. Fruits Canned fruit in a light or heavy syrup. Fried fruit. Fruit in cream or butter sauce. Meat and other protein foods Fatty cuts of meat. Ribs. Fried meat. Berniece Salines. Sausage. Bologna and other processed lunch meats. Salami. Fatback. Hotdogs. Bratwurst. Salted nuts and seeds. Canned beans with added salt. Canned or smoked fish. Whole eggs or egg yolks. Chicken or [place] with skin. Dairy Whole or 2% milk, cream, and half-and-half. Whole or full-fat cream cheese. Whole-fat or sweetened yogurt. Full-fat cheese. Nondairy creamers. Whipped toppings. Processed cheese and cheese spreads. Fats and oils Butter. Stick margarine. Lard. Shortening. Ghee. Bacon fat. Tropical oils, such as coconut, palm kernel, or palm oil. Seasoning and other foods Salted popcorn and pretzels. Onion salt, garlic salt, seasoned salt, table salt, and sea salt. Worcestershire sauce. Tartar sauce. Barbecue sauce. Teriyaki sauce. Soy sauce, including reduced-sodium. Steak sauce. Canned and packaged gravies. Fish sauce. Oyster sauce. Cocktail sauce. Horseradish that you find on the shelf. Ketchup. Mustard. Meat flavorings and tenderizers. Bouillon cubes. Hot sauce and Tabasco sauce. Premade or packaged marinades. Premade or packaged taco seasonings. Relishes. Regular salad dressings. Where to find more information:  National Heart, Lung, and [place]: https://wilson-eaton.com/  American Heart Association: www.heart.org Summary  The DASH eating plan is a healthy eating plan  that has been shown to reduce high blood pressure (hypertension). It may also reduce your risk for type 2 diabetes, heart disease, and stroke.  With the DASH eating plan, you should limit salt (sodium) intake to 2,300 mg a day. If you have hypertension, you may need to reduce your sodium intake to 1,500 mg a day.  When on the DASH eating plan, aim to eat more fresh fruits and vegetables, whole grains, lean proteins, low-fat dairy, and heart-healthy  fats.  Work with your health care provider or diet and nutrition specialist (dietitian) to adjust your eating plan to your individual calorie needs. This information is not intended to replace advice given to you by your health care provider. Make sure you discuss any questions you have with your health care provider. Document Revised: 01/22/2017 Document Reviewed: 02/03/2016 Elsevier Patient Education  2020 Elsevier Inc.     Preventive Care 64-72 Years Old, Male Preventive care refers to lifestyle choices and visits with your health care provider that can promote health and wellness. This includes:  A yearly physical exam. This is also called an annual well check.  Regular dental and eye exams.  Immunizations.  Screening for certain conditions.  Healthy lifestyle choices, such as eating a healthy diet, getting regular exercise, not using drugs or products that contain nicotine and tobacco, and limiting alcohol use. What can I expect for my preventive care visit? Physical exam Your health care provider will check:  Height and weight. These may be used to calculate body mass index (BMI), which is a measurement that tells if you are at a healthy weight.  Heart rate and blood pressure.  Your skin for abnormal spots. Counseling Your health care provider may ask you questions about:  Alcohol, tobacco, and drug use.  Emotional well-being.  Home and relationship well-being.  Sexual activity.  Eating habits.  Work and work Statistician. What immunizations do I need?  Influenza (flu) vaccine  This is recommended every year. Tetanus, diphtheria, and pertussis (Tdap) vaccine  You may need a Td booster every 10 years. Varicella (chickenpox) vaccine  You may need this vaccine if you have not already been vaccinated. Zoster (shingles) vaccine  You may need this after age 34. Measles, mumps, and rubella (MMR) vaccine  You may need at least one dose of MMR if you were  born in 1957 or later. You may also need a second dose. Pneumococcal conjugate (PCV13) vaccine  You may need this if you have certain conditions and were not previously vaccinated. Pneumococcal polysaccharide (PPSV23) vaccine  You may need one or two doses if you smoke cigarettes or if you have certain conditions. Meningococcal conjugate (MenACWY) vaccine  You may need this if you have certain conditions. Hepatitis A vaccine  You may need this if you have certain conditions or if you travel or work in places where you may be exposed to hepatitis A. Hepatitis B vaccine  You may need this if you have certain conditions or if you travel or work in places where you may be exposed to hepatitis B. Haemophilus influenzae type b (Hib) vaccine  You may need this if you have certain risk factors. Human papillomavirus (HPV) vaccine  If recommended by your health care provider, you may need three doses over 6 months. You may receive vaccines as individual doses or as more than one vaccine together in one shot (combination vaccines). Talk with your health care provider about the risks and benefits of combination vaccines. What tests do I  need? Blood tests  Lipid and cholesterol levels. These may be checked every 5 years, or more frequently if you are over 49 years old.  Hepatitis C test.  Hepatitis B test. Screening  Lung cancer screening. You may have this screening every year starting at age 23 if you have a 30-pack-year history of smoking and currently smoke or have quit within the past 15 years.  Prostate cancer screening. Recommendations will vary depending on your family history and other risks.  Colorectal cancer screening. All adults should have this screening starting at age 2 and continuing until age 37. Your health care provider may recommend screening at age 57 if you are at increased risk. You will have tests every 1-10 years, depending on your results and the type of screening  test.  Diabetes screening. This is done by checking your blood sugar (glucose) after you have not eaten for a while (fasting). You may have this done every 1-3 years.  Sexually transmitted disease (STD) testing. Follow these instructions at home: Eating and drinking  Eat a diet that includes fresh fruits and vegetables, whole grains, lean protein, and low-fat dairy products.  Take vitamin and mineral supplements as recommended by your health care provider.  Do not drink alcohol if your health care provider tells you not to drink.  If you drink alcohol: ? Limit how much you have to 0-2 drinks a day. ? Be aware of how much alcohol is in your drink. In the [place] one drink equals one 12 oz bottle of beer (355 mL), one 5 oz glass of wine (148 mL), or one 1 oz glass of hard liquor (44 mL). Lifestyle  Take daily care of your teeth and gums.  Stay active. Exercise for at least 30 minutes on 5 or more days each week.  Do not use any products that contain nicotine or tobacco, such as cigarettes, e-cigarettes, and chewing tobacco. If you need help quitting, ask your health care provider.  If you are sexually active, practice safe sex. Use a condom or other form of protection to prevent STIs (sexually transmitted infections).  Talk with your health care provider about taking a low-dose aspirin every day starting at age 16. What's next?  Go to your health care provider once a year for a well check visit.  Ask your health care provider how often you should have your eyes and teeth checked.  Stay up to date on all vaccines. This information is not intended to replace advice given to you by your health care provider. Make sure you discuss any questions you have with your health care provider. Document Revised: 02/03/2018 Document Reviewed: 02/03/2018 Elsevier Patient Education  2020 [employer].

## 2019-04-28 NOTE — Progress Notes (Signed)
Patient presents to clinic today for annual exam.  Patient is fasting for labs.  Chronic Issues: BPH with LUTS --patient recently restarted his tamsulosin 0.4 mg nightly.  Tolerating well overall substantial improvement in nighttime urinary symptoms.  Denies having to get up at night since starting medication.  Again no daytime symptoms.  Notes doing very well overall.  Hypertension --patient with positive history of hypertension, previously controlled with diet and exercise.Patient denies chest pain, palpitations, lightheadedness, dizziness, vision changes or frequent headaches.  BP elevated at 140/100 today in office.  Notes he is anxious about his checkup.   Health Maintenance: Immunizations -- Flu and Tetanus up-to-date. Colonoscopy -- UTD  Past Medical History:  Diagnosis Date  . Appendicitis   . Bowel obstruction (HCC)   . Gastric ulcer   . GERD (gastroesophageal reflux disease)   . History of kidney stones   . Hypertension    not on meds     Past Surgical History:  Procedure Laterality Date  . APPENDECTOMY    . BOWEL OBSTRUCTION    . CHOLECYSTECTOMY    . ESOPHAGEAL DILATION    . ESOPHAGOGASTRODUODENOSCOPY (EGD) WITH PROPOFOL N/A 07/21/2013   Procedure: ESOPHAGOGASTRODUODENOSCOPY (EGD) WITH PROPOFOL;  Surgeon: Theda Belfast, MD;  Location: [facility];  Service: Endoscopy;  Laterality: N/A;  . KIDNEY STONE SURGERY    . PARATHYROIDECTOMY N/A 12/30/2015   Procedure: NECK EXPLORATION AND PARATHYROIDECTOMY;  Surgeon: Darnell Level, MD;  Location: [facility];  Service: General;  Laterality: N/A;    Current Outpatient Medications on File Prior to Visit  Medication Sig Dispense Refill  . cholecalciferol (VITAMIN D) 25 MCG (1000 UT) tablet Take 1 tablet (1,000 Units total) by mouth daily. 30 tablet 0  . tamsulosin (FLOMAX) 0.4 MG CAPS capsule Take 1 capsule (0.4 mg total) by mouth daily. 30 capsule 3   No current facility-administered medications on file prior to visit.     Allergies  Allergen Reactions  . Ampicillin Anaphylaxis    DID THE REACTION INVOLVE: Swelling of the face/tongue/throat, SOB, or low BP? Yes Sudden or severe rash/hives, skin peeling, or the inside of the mouth or nose? Yes Did it require medical treatment? Yes When did it last happen?less than 10 yrs If all above answers are "NO", may proceed with cephalosporin use.   . Banana Anaphylaxis  . Latex Hives, Shortness Of Breath, Itching and Rash  . Morphine And Related Hives, Shortness Of Breath, Itching and Nausea And Vomiting    headache  . Other Anaphylaxis    Just pecans  . Penicillins Hives and Shortness Of Breath    Has patient had a PCN reaction causing immediate rash, facial/tongue/throat swelling, SOB or lightheadedness with hypotension: Yes Has patient had a PCN reaction causing severe rash involving mucus membranes or skin necrosis: No Has patient had a PCN reaction that required hospitalization No Has patient had a PCN reaction occurring within the last 10 years: No If all of the above answers are "NO", then may proceed with Cephalosporin use.   . Shrimp [Shellfish Allergy] Anaphylaxis    Just shrimp  . Hibiclens [Chlorhexidine Gluconate] Itching    Stinging, burning     Family History  Problem Relation Age of Onset  . Healthy Mother   . Healthy Father   . Diabetes Paternal Recruitment consultant  . Healthy Brother        x1  . Healthy Sister  x2  . Cancer Neg Hx   . Heart disease Neg Hx   . Hypercalcemia Neg Hx     Social History   Socioeconomic History  . Marital status: Single    Spouse name: Not on file  . Number of children: 0  . Years of education: Not on file  . Highest education level: Not on file  Occupational History  . Occupation: Retail banker: [employer]  . Occupation: Technical brewer    Comment: Chiropractor  Tobacco Use  . Smoking status: Never Smoker  . Smokeless tobacco: Never Used  Substance and  Sexual Activity  . Alcohol use: No    Alcohol/week: 0.0 standard drinks  . Drug use: No  . Sexual activity: Not Currently  Other Topics Concern  . Not on file  Social History Narrative  . Not on file   Social Determinants of Health   Financial Resource Strain:   . Difficulty of Paying Living Expenses: Not on file  Food Insecurity:   . Worried About Programme researcher, broadcasting/film/video in the Last Year: Not on file  . Ran Out of Food in the Last Year: Not on file  Transportation Needs:   . Lack of Transportation (Medical): Not on file  . Lack of Transportation (Non-Medical): Not on file  Physical Activity:   . Days of Exercise per Week: Not on file  . Minutes of Exercise per Session: Not on file  Stress:   . Feeling of Stress : Not on file  Social Connections:   . Frequency of Communication with Friends and Family: Not on file  . Frequency of Social Gatherings with Friends and Family: Not on file  . Attends Religious Services: Not on file  . Active Member of Clubs or Organizations: Not on file  . Attends Banker Meetings: Not on file  . Marital Status: Not on file  Intimate Partner Violence:   . Fear of Current or Ex-Partner: Not on file  . Emotionally Abused: Not on file  . Physically Abused: Not on file  . Sexually Abused: Not on file   Review of Systems  Constitutional: Negative for fever and weight loss.  HENT: Negative for ear discharge, ear pain, hearing loss and tinnitus.   Eyes: Negative for blurred vision, double vision, photophobia and pain.  Respiratory: Negative for cough and shortness of breath.   Cardiovascular: Negative for chest pain and palpitations.  Gastrointestinal: Negative for abdominal pain, blood in stool, constipation, diarrhea, heartburn, melena, nausea and vomiting.  Genitourinary: Negative for dysuria, flank pain, frequency, hematuria and urgency.       Nocturia -- x 0 with Flomax  Musculoskeletal: Negative for falls.  Neurological: Negative for  dizziness, loss of consciousness and headaches.  Endo/Heme/Allergies: Negative for environmental allergies.  Psychiatric/Behavioral: Negative for depression, hallucinations, substance abuse and suicidal ideas. The patient is not nervous/anxious and does not have insomnia.    BP (!) 140/98   Pulse 74   Temp 97.9 F (36.6 C) (Temporal)   Resp 14   Ht 6' 3.25" (1.911 m)   Wt 215 lb (97.5 kg)   SpO2 98%   BMI 26.69 kg/m   Physical Exam Vitals reviewed.  Constitutional:      General: He is not in acute distress.    Appearance: He is well-developed. He is not diaphoretic.  HENT:     Head: Normocephalic and atraumatic.     Right Ear: Tympanic membrane, ear canal and external ear  normal.     Left Ear: Tympanic membrane, ear canal and external ear normal.     Nose: Nose normal.     Mouth/Throat:     Pharynx: No posterior oropharyngeal erythema.  Eyes:     Conjunctiva/sclera: Conjunctivae normal.     Pupils: Pupils are equal, round, and reactive to light.  Neck:     Thyroid: No thyromegaly.  Cardiovascular:     Rate and Rhythm: Normal rate and regular rhythm.     Heart sounds: Normal heart sounds.  Pulmonary:     Effort: Pulmonary effort is normal. No respiratory distress.     Breath sounds: Normal breath sounds. No wheezing or rales.  Chest:     Chest wall: No tenderness.  Abdominal:     General: Bowel sounds are normal. There is no distension.     Palpations: Abdomen is soft. There is no mass.     Tenderness: There is no abdominal tenderness. There is no guarding or rebound.  Musculoskeletal:     Cervical back: Neck supple.  Lymphadenopathy:     Cervical: No cervical adenopathy.  Skin:    General: Skin is warm and dry.     Findings: No rash.  Neurological:     Mental Status: He is alert and oriented to person, place, and time.     Cranial Nerves: No cranial nerve deficit.    Assessment/Plan: 1. Visit for preventive health examination Depression screen  negative. Health Maintenance reviewed. Preventive schedule discussed and handout given in AVS. Will obtain fasting labs today. - CBC with Differential/Platelet - Comprehensive metabolic panel - Hemoglobin A1c - Lipid panel  2. Prostate cancer screening The natural history of prostate cancer and ongoing controversy regarding screening and potential treatment outcomes of prostate cancer has been discussed with the patient. The meaning of a false positive PSA and a false negative PSA has been discussed. He indicates understanding of the limitations of this screening test and wishes to proceed with screening PSA testing.  - PSA  3. Benign prostatic hyperplasia with urinary hesitancy Doing very well with Flomax nightly.  Repeat PSA level today. - PSA  4. Essential hypertension BP above goal today.  Reviewed DASH diet.  Patient feels related to anxiety over appointment.  We will have him check ambulatory home blood pressures daily and record.  If remaining above goal we will need to restart medication.  Patient to message [place] with these results in 2 weeks. - CBC with Differential/Platelet - Comprehensive metabolic panel - Hemoglobin A1c - Lipid panel  This visit occurred during the SARS-CoV-2 public health emergency.  Safety protocols were in place, including screening questions prior to the visit, additional usage of staff PPE, and extensive cleaning of exam room while observing appropriate contact time as indicated for disinfecting solutions.     Leeanne Rio, PA-C

## 2019-05-09 ENCOUNTER — Encounter: Payer: Self-pay | Admitting: Physician Assistant

## 2019-05-10 NOTE — Telephone Encounter (Signed)
Home BP improved. Will continue to monitor.

## 2019-05-24 ENCOUNTER — Encounter (HOSPITAL_COMMUNITY): Payer: Self-pay | Admitting: Emergency Medicine

## 2019-05-24 ENCOUNTER — Other Ambulatory Visit: Payer: Self-pay

## 2019-05-24 ENCOUNTER — Encounter (HOSPITAL_COMMUNITY)
Admission: EM | Disposition: A | Discharge status: Home / Self Care | Payer: Self-pay | Source: Home / Self Care | Attending: Emergency Medicine

## 2019-05-24 ENCOUNTER — Ambulatory Visit (HOSPITAL_COMMUNITY)
Admission: EM | Admit: 2019-05-24 | Discharge: 2019-05-24 | Disposition: A | Discharge status: Home / Self Care | Payer: 59 | Attending: Gastroenterology | Admitting: Gastroenterology

## 2019-05-24 ENCOUNTER — Emergency Department (HOSPITAL_COMMUNITY): Payer: 59 | Admitting: Certified Registered Nurse Anesthetist

## 2019-05-24 DIAGNOSIS — Z91018 Allergy to other foods: Secondary | ICD-10-CM | POA: Diagnosis not present

## 2019-05-24 DIAGNOSIS — Z888 Allergy status to other drugs, medicaments and biological substances status: Secondary | ICD-10-CM | POA: Insufficient documentation

## 2019-05-24 DIAGNOSIS — Z885 Allergy status to narcotic agent status: Secondary | ICD-10-CM | POA: Diagnosis not present

## 2019-05-24 DIAGNOSIS — I10 Essential (primary) hypertension: Secondary | ICD-10-CM | POA: Insufficient documentation

## 2019-05-24 DIAGNOSIS — Z87442 Personal history of urinary calculi: Secondary | ICD-10-CM | POA: Diagnosis not present

## 2019-05-24 DIAGNOSIS — Y9389 Activity, other specified: Secondary | ICD-10-CM | POA: Diagnosis not present

## 2019-05-24 DIAGNOSIS — Z9049 Acquired absence of other specified parts of digestive tract: Secondary | ICD-10-CM | POA: Insufficient documentation

## 2019-05-24 DIAGNOSIS — K219 Gastro-esophageal reflux disease without esophagitis: Secondary | ICD-10-CM | POA: Diagnosis not present

## 2019-05-24 DIAGNOSIS — K222 Esophageal obstruction: Secondary | ICD-10-CM | POA: Diagnosis not present

## 2019-05-24 DIAGNOSIS — Z833 Family history of diabetes mellitus: Secondary | ICD-10-CM | POA: Insufficient documentation

## 2019-05-24 DIAGNOSIS — Z8711 Personal history of peptic ulcer disease: Secondary | ICD-10-CM | POA: Diagnosis not present

## 2019-05-24 DIAGNOSIS — X58XXXA Exposure to other specified factors, initial encounter: Secondary | ICD-10-CM | POA: Diagnosis not present

## 2019-05-24 DIAGNOSIS — E892 Postprocedural hypoparathyroidism: Secondary | ICD-10-CM | POA: Diagnosis not present

## 2019-05-24 DIAGNOSIS — Z20822 Contact with and (suspected) exposure to covid-19: Secondary | ICD-10-CM | POA: Insufficient documentation

## 2019-05-24 DIAGNOSIS — T18128A Food in esophagus causing other injury, initial encounter: Secondary | ICD-10-CM

## 2019-05-24 DIAGNOSIS — Z91048 Other nonmedicinal substance allergy status: Secondary | ICD-10-CM | POA: Diagnosis not present

## 2019-05-24 DIAGNOSIS — Z880 Allergy status to penicillin: Secondary | ICD-10-CM | POA: Diagnosis not present

## 2019-05-24 DIAGNOSIS — R1310 Dysphagia, unspecified: Secondary | ICD-10-CM | POA: Diagnosis not present

## 2019-05-24 DIAGNOSIS — N400 Enlarged prostate without lower urinary tract symptoms: Secondary | ICD-10-CM | POA: Diagnosis not present

## 2019-05-24 DIAGNOSIS — Z91013 Allergy to seafood: Secondary | ICD-10-CM | POA: Diagnosis not present

## 2019-05-24 DIAGNOSIS — Z79899 Other long term (current) drug therapy: Secondary | ICD-10-CM | POA: Insufficient documentation

## 2019-05-24 HISTORY — PX: BALLOON DILATION: SHX5330

## 2019-05-24 HISTORY — PX: ESOPHAGOGASTRODUODENOSCOPY (EGD) WITH PROPOFOL: SHX5813

## 2019-05-24 HISTORY — PX: FOREIGN BODY REMOVAL: SHX962

## 2019-05-24 LAB — RESPIRATORY PANEL BY RT PCR (FLU A&B, COVID)
Influenza A by PCR: NEGATIVE
Influenza B by PCR: NEGATIVE
SARS Coronavirus 2 by RT PCR: NEGATIVE

## 2019-05-24 SURGERY — ESOPHAGOGASTRODUODENOSCOPY (EGD) WITH PROPOFOL
Anesthesia: Monitor Anesthesia Care

## 2019-05-24 MED ORDER — LACTATED RINGERS IV SOLN: INTRAVENOUS | Status: DC | PRN

## 2019-05-24 MED ORDER — LIDOCAINE 2% (20 MG/ML) 5 ML SYRINGE
INTRAMUSCULAR | Status: DC | PRN
  Administered 2019-05-24: 60 mg via INTRAVENOUS

## 2019-05-24 MED ORDER — GLYCOPYRROLATE PF 0.2 MG/ML IJ SOSY
PREFILLED_SYRINGE | INTRAMUSCULAR | Status: DC | PRN
  Administered 2019-05-24: .2 mg via INTRAVENOUS

## 2019-05-24 MED ORDER — PROPOFOL 10 MG/ML IV BOLUS
INTRAVENOUS | Status: DC | PRN
  Administered 2019-05-24: 50 mg via INTRAVENOUS

## 2019-05-24 MED ORDER — PROPOFOL 500 MG/50ML IV EMUL
INTRAVENOUS | Status: DC | PRN
  Administered 2019-05-24: 150 ug/kg/min via INTRAVENOUS

## 2019-05-24 MED ORDER — LACTATED RINGERS IV SOLN
INTRAVENOUS | Status: AC | PRN
  Administered 2019-05-24: 1000 mL via INTRAVENOUS

## 2019-05-24 SURGICAL SUPPLY — 14 items

## 2019-05-24 NOTE — ED Provider Notes (Signed)
[facility] EMERGENCY DEPARTMENT Provider Note   CSN: [id number] Arrival date & time: 05/24/19  7829     History No chief complaint on file.   Gregory Sweeney is a 59 y.o. male.  Patient here for food impaction.  Was scheduled to have an EGD today for esophageal stricture history and dilation possibly with Dr. Benson Norway.  Had a food impaction last night.  However comfortable upon arrival here.  Was sent here prior to EGD for Covid test as did not get preop Covid screen.  The history is provided by the patient.  Illness Location:  General Severity:  Mild Onset quality:  Gradual Timing:  Constant Progression:  Unchanged Chronicity:  New Associated symptoms: no abdominal pain, no cough, no fever and no shortness of breath        Past Medical History:  Diagnosis Date  . Appendicitis   . Bowel obstruction ([place])   . Gastric ulcer   . GERD (gastroesophageal reflux disease)   . History of kidney stones   . Hypertension    not on meds     Patient Active Problem List   Diagnosis Date Noted  . Benign prostatic hyperplasia with urinary hesitancy 04/21/2018  . History of small bowel obstruction 06/14/2017  . Visit for preventive health examination 06/12/2015  . Prostate cancer screening 06/12/2015  . Essential hypertension 10/21/2014    Past Surgical History:  Procedure Laterality Date  . APPENDECTOMY    . BOWEL OBSTRUCTION    . CHOLECYSTECTOMY    . ESOPHAGEAL DILATION    . ESOPHAGOGASTRODUODENOSCOPY (EGD) WITH PROPOFOL N/A 07/21/2013   Procedure: ESOPHAGOGASTRODUODENOSCOPY (EGD) WITH PROPOFOL;  Surgeon: Beryle Beams, MD;  Location: [facility] END[facility]COPY;  Service: Endoscopy;  Laterality: N/A;  . KIDNEY STONE SURGERY    . PARATHYROIDECTOMY N/A 12/30/2015   Procedure: NECK EXPLORATION AND PARATHYROIDECTOMY;  Surgeon: Armandina Gemma, MD;  Location: [facility] OR[facility];  Service: General;  Laterality: N/A;       Family History  Problem Relation Age of Onset  . Healthy Mother   .  Healthy Father   . Diabetes Paternal Control and instrumentation engineer  . Healthy Brother        x1  . Healthy Sister        x2  . Cancer Neg Hx   . Heart disease Neg Hx   . Hypercalcemia Neg Hx     Social History   Tobacco Use  . Smoking status: Never Smoker  . Smokeless tobacco: Never Used  Substance Use Topics  . Alcohol use: No    Alcohol/week: 0.0 standard drinks  . Drug use: No    Home Medications Prior to Admission medications   Medication Sig Start Date End Date Taking? Authorizing Provider  cholecalciferol (VITAMIN D) 25 MCG (1000 UT) tablet Take 1 tablet (1,000 Units total) by mouth daily. 04/26/18   Brunetta Jeans, PA-C  tamsulosin (FLOMAX) 0.4 MG CAPS capsule Take 1 capsule (0.4 mg total) by mouth daily. 04/10/19   Brunetta Jeans, PA-C    Allergies    Ampicillin, Banana, Latex, Morphine and related, Other, Penicillins, Shrimp [shellfish allergy], and Hibiclens [chlorhexidine gluconate]  Review of Systems   Review of Systems  Constitutional: Negative for fever.  HENT: Positive for trouble swallowing. Negative for drooling.   Respiratory: Negative for cough and shortness of breath.   Gastrointestinal: Negative for abdominal pain.    Physical Exam Updated Vital Signs  ED Triage Vitals  Enc Vitals Group     BP 05/24/19 0915 (!) 156/105     Pulse Rate 05/24/19 0915 94     Resp 05/24/19 0915 18     Temp 05/24/19 0915 98.3 F (36.8 C)     Temp Source 05/24/19 0915 Oral     SpO2 05/24/19 0915 100 %     Weight 05/24/19 0915 205 lb (93 kg)     Height 05/24/19 0915 6\' 3"  (1.905 m)     Head Circumference --      Peak Flow --      Pain Score 05/24/19 0929 0     Pain Loc --      Pain Edu? --      Excl. in GC? --     Physical Exam Constitutional:      General: He is not in acute distress.    Appearance: He is not ill-appearing.  HENT:     Head: Normocephalic and atraumatic.     Nose: Nose normal.     Mouth/Throat:     Mouth: Mucous membranes are moist.    Cardiovascular:     Pulses: Normal pulses.  Pulmonary:     Effort: No respiratory distress.     Breath sounds: No stridor. No wheezing.  Neurological:     Mental Status: He is alert.     ED Results / Procedures / Treatments   Labs (all labs ordered are listed, but only abnormal results are displayed) Labs Reviewed  RESPIRATORY PANEL BY RT PCR (FLU A&B, COVID)    EKG None  Radiology No results found.  Procedures Procedures (including critical care time)  Medications Ordered in ED Medications - No data to display  ED Course  I have reviewed the triage vital signs and the nursing notes.  Pertinent labs & imaging results that were available during my care of the patient were reviewed by me and considered in my medical decision making (see chart for details).    MDM Rules/Calculators/A&P                      Gregory Sweeney is a 59 year old male here for food impaction.  Already had an EGD scheduled today by GI to stretch his esophagus as history of strictures.  Got food impaction last night.  However, patient did not get Covid test prior to EGD therefore was sent by Dr. 77 with GI for Covid screening and will go directly to endoscopy for food removal and esophageal stretching possibly today.  No signs of respiratory distress on exam.  Covid test has been ordered.  Will go to endoscopy with GI.  Stable throughout my care.  This chart was dictated using voice recognition software.  Despite best efforts to proofread,  errors can occur which can change the documentation meaning.     Final Clinical Impression(s) / ED Diagnoses Final diagnoses:  Food impaction of esophagus, initial encounter    Rx / DC Orders ED Discharge Orders    None       Elnoria Howard, DO 05/24/19 1026

## 2019-05-24 NOTE — ED Notes (Signed)
Patient transported to endo by RN with wife.

## 2019-05-24 NOTE — Anesthesia Postprocedure Evaluation (Signed)
Anesthesia Post Note  Patient: Gregory Sweeney  Procedure(s) Performed: ESOPHAGOGASTRODUODENOSCOPY (EGD) WITH PROPOFOL (N/A ) BALLOON DILATION (N/A ) FOREIGN BODY REMOVAL     Patient location during evaluation: Endoscopy Anesthesia Type: MAC Level of consciousness: awake Pain management: pain level controlled Vital Signs Assessment: post-procedure vital signs reviewed and stable Respiratory status: spontaneous breathing Cardiovascular status: stable Postop Assessment: no apparent nausea or vomiting Anesthetic complications: no    Last Vitals:  Vitals:   05/24/19 1340 05/24/19 1350  BP: 114/68 127/83  Pulse: 73 69  Resp: 14 18  Temp:    SpO2: 100% 100%    Last Pain:  Vitals:   05/24/19 1330  TempSrc:   PainSc: 0-No pain   Pain Goal:                   Huston Foley

## 2019-05-24 NOTE — Transfer of Care (Signed)
Immediate Anesthesia Transfer of Care Note  Patient: Gregory Sweeney  Procedure(s) Performed: ESOPHAGOGASTRODUODENOSCOPY (EGD) WITH PROPOFOL (N/A ) BALLOON DILATION (N/A ) FOREIGN BODY REMOVAL  Patient Location: PACU, ENDO  Anesthesia Type:MAC  Level of Consciousness: awake, alert  and drowsy  Airway & Oxygen Therapy: Patient Spontanous Breathing and Patient connected to nasal cannula oxygen  Post-op Assessment: Report given to RN and Post -op Vital signs reviewed and stable  Post vital signs: Reviewed and stable  Last Vitals:  Vitals Value Taken Time  BP 135/59 05/24/19 1315  Temp    Pulse 73 05/24/19 1316  Resp 26 05/24/19 1316  SpO2 99 % 05/24/19 1316  Vitals shown include unvalidated device data.  Last Pain:  Vitals:   05/24/19 1315  TempSrc:   PainSc: 0-No pain         Complications: No apparent anesthesia complications

## 2019-05-24 NOTE — Anesthesia Procedure Notes (Signed)
Procedure Name: MAC Date/Time: 05/24/2019 12:44 PM Performed by: Alain Marion, CRNA Pre-anesthesia Checklist: Patient identified, Emergency Drugs available, Suction available and Patient being monitored Oxygen Delivery Method: Nasal cannula Placement Confirmation: positive ETCO2

## 2019-05-24 NOTE — ED Triage Notes (Signed)
Pt here for a covid test to go to endo this morning due to a food stuck in his throat ,

## 2019-05-24 NOTE — Consult Note (Signed)
Reason for Consult: Food impaction Referring Physician: ER  Louie Casa HPI: This is a 59 year old male with a PMH of a GE junction stricture, GERD, and HTN who presents to the ER with complaints of a food impaction.  The symptoms started acutely yesterday after he ate a piece of meat.  Since that time he was not able to manage his oral secretions.  He underwent an EGD last summer for dysphagia complaints and he was found to have a GE junction stricture.  Dilation was performed with a 15 mm Savary, but further dilation was not possible as he developed laryngospasm.  Since that time he did not complains of any further issues of dysphagia until now.  Past Medical History:  Diagnosis Date  . Appendicitis   . Bowel obstruction ([place])   . Gastric ulcer   . GERD (gastroesophageal reflux disease)   . History of kidney stones   . Hypertension    not on meds     Past Surgical History:  Procedure Laterality Date  . APPENDECTOMY    . BOWEL OBSTRUCTION    . CHOLECYSTECTOMY    . ESOPHAGEAL DILATION    . ESOPHAGOGASTRODUODENOSCOPY (EGD) WITH PROPOFOL N/A 07/21/2013   Procedure: ESOPHAGOGASTRODUODENOSCOPY (EGD) WITH PROPOFOL;  Surgeon: Beryle Beams, MD;  Location: [facility];  Service: Endoscopy;  Laterality: N/A;  . KIDNEY STONE SURGERY    . PARATHYROIDECTOMY N/A 12/30/2015   Procedure: NECK EXPLORATION AND PARATHYROIDECTOMY;  Surgeon: Armandina Gemma, MD;  Location: [facility];  Service: General;  Laterality: N/A;    Family History  Problem Relation Age of Onset  . Healthy Mother   . Healthy Father   . Diabetes Paternal Control and instrumentation engineer  . Healthy Brother        x1  . Healthy Sister        x2  . Cancer Neg Hx   . Heart disease Neg Hx   . Hypercalcemia Neg Hx     Social History:  reports that he has never smoked. He has never used smokeless tobacco. He reports that he does not drink alcohol or use drugs.  Allergies:  Allergies  Allergen Reactions  . Ampicillin Anaphylaxis   DID THE REACTION INVOLVE: Swelling of the face/tongue/throat, SOB, or low BP? Yes Sudden or severe rash/hives, skin peeling, or the inside of the mouth or nose? Yes Did it require medical treatment? Yes When did it last happen?less than 10 yrs If all above answers are "NO", may proceed with cephalosporin use.   . Banana Anaphylaxis  . Latex Hives, Shortness Of Breath, Itching and Rash  . Morphine And Related Hives, Shortness Of Breath, Itching and Nausea And Vomiting    headache  . Other Anaphylaxis    Just pecans  . Penicillins Hives and Shortness Of Breath    Has patient had a PCN reaction causing immediate rash, facial/tongue/throat swelling, SOB or lightheadedness with hypotension: Yes Has patient had a PCN reaction causing severe rash involving mucus membranes or skin necrosis: No Has patient had a PCN reaction that required hospitalization No Has patient had a PCN reaction occurring within the last 10 years: No If all of the above answers are "NO", then may proceed with Cephalosporin use.   . Shrimp [Shellfish Allergy] Anaphylaxis    Just shrimp  . Hibiclens [Chlorhexidine Gluconate] Itching    Stinging, burning     Medications:  Scheduled:  Continuous: . lactated ringers 1,000 mL (05/24/19  1142)    Results for orders placed or performed during the hospital encounter of 05/24/19 (from the past 24 hour(s))  Respiratory Panel by RT PCR (Flu A&B, Covid) - Nasopharyngeal Swab     Status: None   Collection Time: 05/24/19  9:42 AM   Specimen: Nasopharyngeal Swab  Result Value Ref Range   SARS Coronavirus 2 by RT PCR NEGATIVE NEGATIVE   Influenza A by PCR NEGATIVE NEGATIVE   Influenza B by PCR NEGATIVE NEGATIVE     No results found.  ROS:  As stated above in the HPI otherwise negative.  Blood pressure (!) 172/94, pulse 60, temperature 97.9 F (36.6 C), temperature source Oral, resp. rate 16, height 6\' 3"  (1.905 m), weight 93 kg, SpO2 98 %.    PE: Gen: NAD, Alert  and Oriented HEENT:  [place]/AT, EOMI Neck: Supple, no LAD Lungs: CTA Bilaterally CV: RRR without M/G/R ABM: Soft, NTND, +BS Ext: No C/C/E  Assessment/Plan: 1) Food impaction. 2) Known history of a GE junction stricture.  Plan: 1) EGD for disimpaction and possible dilation.  Twanisha Foulk D 05/24/2019, 12:37 PM

## 2019-05-24 NOTE — Discharge Instructions (Signed)

## 2019-05-24 NOTE — Anesthesia Preprocedure Evaluation (Signed)
Anesthesia Evaluation    Airway Mallampati: I       Dental no notable dental hx. (+) Teeth Intact   Pulmonary neg pulmonary ROS,    Pulmonary exam normal breath sounds clear to auscultation       Cardiovascular hypertension, Normal cardiovascular exam Rhythm:Regular Rate:Normal     Neuro/Psych negative neurological ROS  negative psych ROS   GI/Hepatic Neg liver ROS,   Endo/Other  negative endocrine ROS  Renal/GU negative Renal ROS     Musculoskeletal negative musculoskeletal ROS (+)   Abdominal Normal abdominal exam  (+)   Peds  Hematology negative hematology ROS (+)   Anesthesia Other Findings   Reproductive/Obstetrics                             Anesthesia Physical Anesthesia Plan  ASA: II  Anesthesia Plan: MAC   Post-op Pain Management:    Induction: Intravenous  PONV Risk Score and Plan: Propofol infusion  Airway Management Planned: Natural Airway and Mask  Additional Equipment: None  Intra-op Plan:   Post-operative Plan:   Informed Consent: I have reviewed the patients History and Physical, chart, labs and discussed the procedure including the risks, benefits and alternatives for the proposed anesthesia with the patient or authorized representative who has indicated his/her understanding and acceptance.     Dental advisory given  Plan Discussed with: CRNA  Anesthesia Plan Comments:         Anesthesia Quick Evaluation

## 2019-05-24 NOTE — Op Note (Signed)
[facility] Patient Name: Gregory Sweeney Procedure Date : 05/24/2019 MRN: [id number] Attending MD: Jeani Hawking , MD Date of Birth: 15-Jun-1960 CSN: [id number] Age: 59 Admit Type: Inpatient Procedure:                Upper GI endoscopy Indications:              Dysphagia Providers:                Jeani Hawking, MD, Blenda Mounts, RN, Wanita Chamberlain,                            Technician Referring MD:              Medicines:                Propofol per Anesthesia Complications:            No immediate complications. Estimated Blood Loss:     Estimated blood loss was minimal. Procedure:                Pre-Anesthesia Assessment:                           - Prior to the procedure, a History and Physical                            was performed, and patient medications and                            allergies were reviewed. The patient's tolerance of                            previous anesthesia was also reviewed. The risks                            and benefits of the procedure and the sedation                            options and risks were discussed with the patient.                            All questions were answered, and informed consent                            was obtained. Prior Anticoagulants: The patient has                            taken no previous anticoagulant or antiplatelet                            agents. ASA Grade Assessment: I - A normal, healthy                            patient. After reviewing the risks and benefits,  the patient was deemed in satisfactory condition to                            undergo the procedure.                           - Sedation was administered by an anesthesia                            professional. Deep sedation was attained.                           After obtaining informed consent, the endoscope was                            passed under direct vision. Throughout the        procedure, the patient's blood pressure, pulse, and                            oxygen saturations were monitored continuously. The                            [id number]-H190 ([id number] Olympus gastroscope was                            introduced through the mouth, and advanced to the                            second part of duodenum. The upper GI endoscopy was                            accomplished without difficulty. The patient                            tolerated the procedure well. Scope In: Scope Out: Findings:      Food was found in the lower third of the esophagus. Removal of food was       accomplished.      One benign-appearing, intrinsic mild stenosis was found at the       gastroesophageal junction. This stenosis measured 1.5 cm (inner       diameter) x less than one cm (in length). The stenosis was traversed. A       TTS dilator was passed through the scope. Dilation with a 15-16.5-18 mm       balloon dilator was performed to 18 mm. The dilation site was examined       and showed complete resolution of luminal narrowing. Estimated blood       loss was minimal.      The stomach was normal.      The examined duodenum was normal.      In the distal esohpagus a meat impaction was encountered. The food bolus       was able to be passed into the gastric lumen with gentle forward       pressure. There was no evidence of any inflammation or ulceration       secondary to the meat  bolus stasis. Dilation was performed with a       balloon starting at 15 mm. There was free movement of the balloon       through the GE junction. The diameter was increased to 16.5 mm and a       mild mucosal break was directly visualized. The balloon was deflated and       the GE junction was reinspected. It was felt that dilation was able to       be performed. Moving up .5 ATMs with the balloon inflation from 4 ATMs       up to 7 ATMs the mucosal break was augmented, but in a very controlled        manner. At 18 mm (7 ATMs) the balloon was held in placed for 30 seconds       and then deflated. There was no evidence of crepitus during or       immediately after the procedure. Impression:               - Food in the lower third of the esophagus. Removal                            was successful.                           - Benign-appearing esophageal stenosis. Dilated.                           - Normal stomach.                           - Normal examined duodenum. Recommendation:           - Patient has a contact number available for                            emergencies. The signs and symptoms of potential                            delayed complications were discussed with the                            patient. Return to normal activities tomorrow.                            Written discharge instructions were provided to the                            patient.                           - Resume previous diet.                           - Continue present medications.                           - Return to GI clinic in 4 weeks. Procedure Code(s):        --- Professional ---  17510, Esophagogastroduodenoscopy, flexible,                            transoral; with removal of foreign body(s)                           43249, Esophagogastroduodenoscopy, flexible,                            transoral; with transendoscopic balloon dilation of                            esophagus (less than 30 mm diameter) Diagnosis Code(s):        --- Professional ---                           C58.527P, Food in esophagus causing other injury,                            initial encounter                           K22.2, Esophageal obstruction                           R13.10, Dysphagia, unspecified CPT copyright 2019 [employer]. All rights reserved. The codes documented in this report are preliminary and upon coder review may  be revised to meet current  compliance requirements. Jeani Hawking, MD Jeani Hawking, MD 05/24/2019 1:12:10 PM This report has been signed electronically. Number of Addenda: 0

## 2019-08-18 ENCOUNTER — Other Ambulatory Visit: Payer: Self-pay

## 2019-08-18 MED ORDER — TAMSULOSIN HCL 0.4 MG PO CAPS: 0.4000 mg | ORAL_CAPSULE | Freq: Every day | ORAL | 3 refills | Status: DC

## 2019-11-02 ENCOUNTER — Ambulatory Visit (INDEPENDENT_AMBULATORY_CARE_PROVIDER_SITE_OTHER): Payer: 59 | Admitting: Physician Assistant

## 2019-11-02 ENCOUNTER — Other Ambulatory Visit (INDEPENDENT_AMBULATORY_CARE_PROVIDER_SITE_OTHER): Payer: 59

## 2019-11-02 ENCOUNTER — Other Ambulatory Visit: Payer: Self-pay

## 2019-11-02 ENCOUNTER — Encounter: Payer: Self-pay | Admitting: Physician Assistant

## 2019-11-02 VITALS — BP 140/84 | HR 78 | Temp 98.1°F | Resp 16 | Ht 75.0 in | Wt 206.0 lb

## 2019-11-02 DIAGNOSIS — R5382 Chronic fatigue, unspecified: Secondary | ICD-10-CM | POA: Diagnosis not present

## 2019-11-02 LAB — COMPREHENSIVE METABOLIC PANEL
ALT: 13 U/L (ref 0–53)
AST: 15 U/L (ref 0–37)
Albumin: 4.1 g/dL (ref 3.5–5.2)
Alkaline Phosphatase: 44 U/L (ref 39–117)
BUN: 14 mg/dL (ref 6–23)
CO2: 27 mEq/L (ref 19–32)
Calcium: 9.2 mg/dL (ref 8.4–10.5)
Chloride: 104 mEq/L (ref 96–112)
Creatinine, Ser: 1.2 mg/dL (ref 0.40–1.50)
GFR: 74.33 mL/min (ref >60.00)
Glucose, Bld: 94 mg/dL (ref 70–99)
Potassium: 5 mEq/L (ref 3.5–5.1)
Sodium: 138 mEq/L (ref 135–145)
Total Bilirubin: 0.6 mg/dL (ref 0.2–1.2)
Total Protein: 7.1 g/dL (ref 6.0–8.3)

## 2019-11-02 LAB — CBC WITH DIFFERENTIAL/PLATELET
Basophils Absolute: 0 10*3/uL (ref 0.0–0.1)
Basophils Relative: 0.7 % (ref 0.0–3.0)
Eosinophils Absolute: 0.2 10*3/uL (ref 0.0–0.7)
Eosinophils Relative: 3.5 % (ref 0.0–5.0)
HCT: 43 % (ref 39.0–52.0)
Hemoglobin: 14.6 g/dL (ref 13.0–17.0)
Lymphocytes Relative: 25.5 % (ref 12.0–46.0)
Lymphs Abs: 1.7 10*3/uL (ref 0.7–4.0)
MCHC: 34 g/dL (ref 30.0–36.0)
MCV: 88.9 fl (ref 78.0–100.0)
Monocytes Absolute: 0.5 10*3/uL (ref 0.1–1.0)
Monocytes Relative: 7.6 % (ref 3.0–12.0)
Neutro Abs: 4.3 10*3/uL (ref 1.4–7.7)
Neutrophils Relative %: 62.7 % (ref 43.0–77.0)
Platelets: 234 10*3/uL (ref 150.0–400.0)
RBC: 4.83 Mil/uL (ref 4.22–5.81)
RDW: 14.1 % (ref 11.5–15.5)
WBC: 6.8 10*3/uL (ref 4.0–10.5)

## 2019-11-02 LAB — TSH: TSH: 1.92 u[IU]/mL (ref 0.35–4.50)

## 2019-11-02 LAB — LIPASE: Lipase: 13 U/L (ref 11.0–59.0)

## 2019-11-02 LAB — VITAMIN B12: Vitamin B-12: 227 pg/mL (ref 211–911)

## 2019-11-02 LAB — HEMOGLOBIN A1C: Hgb A1c MFr Bld: 6.2 % (ref 4.6–6.5)

## 2019-11-02 LAB — MAGNESIUM: Magnesium: 2 mg/dL (ref 1.5–2.5)

## 2019-11-02 LAB — VITAMIN D 25 HYDROXY (VIT D DEFICIENCY, FRACTURES): VITD: 23.22 ng/mL — ABNORMAL LOW (ref 30.00–100.00)

## 2019-11-02 NOTE — Progress Notes (Signed)
Patient presents to clinic today c/o intermittent episodes of tingling and pain in hands and feet, lasting for a few minutes and followed within 24 hours by stomach ache and fatigue lasting for up to a day. Denies any chest pain, racing heart, SOB, lightheadedness or dizziness. Denies fever, chills, weight changes, sweats. Denies nausea, vomiting, heart burn. Is eating well and keeping well-hydrated. Feels symptoms started with taking Flomax. Notes in regarding to nighttime BPH symptoms, the medication has worked very well.   Past Medical History:  Diagnosis Date   Appendicitis    Bowel obstruction (HCC)    Gastric ulcer    GERD (gastroesophageal reflux disease)    History of kidney stones    Hypertension    not on meds     Current Outpatient Medications on File Prior to Visit  Medication Sig Dispense Refill   tamsulosin (FLOMAX) 0.4 MG CAPS capsule Take 1 capsule (0.4 mg total) by mouth daily. 30 capsule 3   cholecalciferol (VITAMIN D) 25 MCG (1000 UT) tablet Take 1 tablet (1,000 Units total) by mouth daily. (Patient not taking: Reported on 11/02/2019) 30 tablet 0   No current facility-administered medications on file prior to visit.    Allergies  Allergen Reactions   Ampicillin Anaphylaxis    DID THE REACTION INVOLVE: Swelling of the face/tongue/throat, SOB, or low BP? Yes Sudden or severe rash/hives, skin peeling, or the inside of the mouth or nose? Yes Did it require medical treatment? Yes When did it last happen?less than 10 yrs If all above answers are NO, may proceed with cephalosporin use.    Banana Anaphylaxis   Latex Hives, Shortness Of Breath, Itching and Rash   Morphine And Related Hives, Shortness Of Breath, Itching and Nausea And Vomiting    headache   Other Anaphylaxis    Just pecans   Penicillins Hives and Shortness Of Breath    Has patient had a PCN reaction causing immediate rash, facial/tongue/throat swelling, SOB or lightheadedness with  hypotension: Yes Has patient had a PCN reaction causing severe rash involving mucus membranes or skin necrosis: No Has patient had a PCN reaction that required hospitalization No Has patient had a PCN reaction occurring within the last 10 years: No If all of the above answers are "NO", then may proceed with Cephalosporin use.    Shrimp [Shellfish Allergy] Anaphylaxis    Just shrimp   Hibiclens [Chlorhexidine Gluconate] Itching    Stinging, burning     Family History  Problem Relation Age of Onset   Healthy Mother    Healthy Father    Diabetes Paternal Recruitment consultant   Healthy Brother        x1   Healthy Sister        x2   Cancer Neg Hx    Heart disease Neg Hx    Hypercalcemia Neg Hx     Social History   Socioeconomic History   Marital status: Single    Spouse name: Not on file   Number of children: 0   Years of education: Not on file   Highest education level: Not on file  Occupational History   Occupation: Mail carrier    Associate Professor: [employer]   Occupation: Technical brewer    Comment: Chiropractor  Tobacco Use   Smoking status: Never Smoker   Smokeless tobacco: Never Used  Building services engineer Use: Never used  Substance and Sexual Activity   Alcohol  use: No    Alcohol/week: 0.0 standard drinks   Drug use: No   Sexual activity: Not Currently  Other Topics Concern   Not on file  Social History Narrative   Not on file   Social Determinants of Health   Financial Resource Strain:    Difficulty of Paying Living Expenses: Not on file  Food Insecurity:    Worried About Running Out of Food in the Last Year: Not on file   Ran Out of Food in the Last Year: Not on file  Transportation Needs:    Lack of Transportation (Medical): Not on file   Lack of Transportation (Non-Medical): Not on file  Physical Activity:    Days of Exercise per Week: Not on file   Minutes of Exercise per Session: Not on file  Stress:     Feeling of Stress : Not on file  Social Connections:    Frequency of Communication with Friends and Family: Not on file   Frequency of Social Gatherings with Friends and Family: Not on file   Attends Religious Services: Not on file   Active Member of Clubs or Organizations: Not on file   Attends Banker Meetings: Not on file   Marital Status: Not on file   Review of Systems - See HPI.  All other ROS are negative.  Wt 206 lb (93.4 kg)    BMI 25.75 kg/m   Physical Exam Vitals reviewed.  Constitutional:      Appearance: Normal appearance.  HENT:     Head: Normocephalic and atraumatic.  Cardiovascular:     Rate and Rhythm: Normal rate and regular rhythm.     Pulses: Normal pulses.     Heart sounds: Normal heart sounds.  Pulmonary:     Effort: Pulmonary effort is normal.     Breath sounds: Normal breath sounds.  Musculoskeletal:     Cervical back: Neck supple.  Neurological:     General: No focal deficit present.     Mental Status: He is alert and oriented to person, place, and time.  Psychiatric:        Mood and Affect: Mood normal.    Assessment/Plan: 1. Chronic fatigue Episodic with odd constellation of prodromal symptoms not limited to one physiologic system or process. Discussed medication should not cause episodic symptoms if truly a side effect but can consider trial off medication or switching to Rapaflo if workup negative. Will obtain full lab panel today to further assess.  - B12; Future - CBC with Differential/Platelet; Future - Comprehensive metabolic panel; Future - Hemoglobin A1c; Future - Lipase; Future - Magnesium; Future - TSH; Future - Vitamin D (25 hydroxy); Future  This visit occurred during the SARS-CoV-2 public health emergency.  Safety protocols were in place, including screening questions prior to the visit, additional usage of staff PPE, and extensive cleaning of exam room while observing appropriate contact time as indicated for  disinfecting solutions.     Piedad Climes, PA-C

## 2019-11-02 NOTE — Patient Instructions (Signed)
Please go to our [facility] office for lab or we will need to schedule labs for another day as our lab technician is not here.   [facility] office is open M-F 8-5. Lab on bottom floor. No appt needed. The address is [address].   I will call as soon as I have results and reviewed them and we will make further adjustments at that time.   Hang in there!

## 2019-11-07 ENCOUNTER — Other Ambulatory Visit: Payer: Self-pay | Admitting: Emergency Medicine

## 2019-11-07 DIAGNOSIS — E559 Vitamin D deficiency, unspecified: Secondary | ICD-10-CM

## 2019-11-07 DIAGNOSIS — N401 Enlarged prostate with lower urinary tract symptoms: Secondary | ICD-10-CM

## 2019-11-07 MED ORDER — VITAMIN D2 50 MCG (2000 UT) PO TABS: 1.0000 | ORAL_TABLET | Freq: Every day | ORAL | 0 refills | Status: DC

## 2019-11-07 MED ORDER — SILODOSIN 4 MG PO CAPS: 4.0000 mg | ORAL_CAPSULE | Freq: Every day | ORAL | 5 refills | Status: DC

## 2019-11-07 MED ORDER — VITAMIN D (ERGOCALCIFEROL) 1.25 MG (50000 UNIT) PO CAPS: ORAL_CAPSULE | ORAL | 3 refills | Status: DC

## 2019-12-14 ENCOUNTER — Other Ambulatory Visit: Payer: Self-pay

## 2019-12-14 ENCOUNTER — Ambulatory Visit (INDEPENDENT_AMBULATORY_CARE_PROVIDER_SITE_OTHER): Payer: 59 | Admitting: Physician Assistant

## 2019-12-14 ENCOUNTER — Encounter: Payer: Self-pay | Admitting: Physician Assistant

## 2019-12-14 VITALS — BP 140/82 | HR 85 | Temp 98.1°F | Resp 16 | Ht 75.0 in | Wt 208.0 lb

## 2019-12-14 DIAGNOSIS — Z23 Encounter for immunization: Secondary | ICD-10-CM

## 2019-12-14 DIAGNOSIS — T50905A Adverse effect of unspecified drugs, medicaments and biological substances, initial encounter: Secondary | ICD-10-CM | POA: Diagnosis not present

## 2019-12-14 DIAGNOSIS — R21 Rash and other nonspecific skin eruption: Secondary | ICD-10-CM

## 2019-12-14 DIAGNOSIS — R3911 Hesitancy of micturition: Secondary | ICD-10-CM | POA: Diagnosis not present

## 2019-12-14 DIAGNOSIS — N401 Enlarged prostate with lower urinary tract symptoms: Secondary | ICD-10-CM

## 2019-12-14 NOTE — Patient Instructions (Signed)
I am glad symptoms have resolved for you.  No more Rapaflo!  I am glad there are also no recurring urinary symptoms.  If you note anything recurring -- decreased stream, multiple nighttime urination, etc, please let me know as we will want to have you see Urology.   For the residual skin inflammation, keep skin clean and dry. Use non-scented and non-dyed soaps. Start a good moisturizing regimen. The areas will lighten over time.

## 2019-12-14 NOTE — Progress Notes (Signed)
Patient presents to clinic today to discuss medication reaction. Patient was recently switched from Flomax to Rapaflo for symptoms of BPH.  Patient endorses starting medication and taking as directed but noted a rash about a week into medication. Describes rash as blistering of scalp, body itching and sloughing of skin under his armpits. Notes that he thought was related to something he was eating so started an elimination diet with no change. Notes shortly after taking a next dose of Rapflo, symptoms came back severely. Stopped medication immediately..  Since stopping medication, all reactive symptoms have resolved.  He denies any note of LUTS from BPH since stopping medication..   Past Medical History:  Diagnosis Date  . Appendicitis   . Bowel obstruction (HCC)   . Gastric ulcer   . GERD (gastroesophageal reflux disease)   . History of kidney stones   . Hypertension    not on meds     Current Outpatient Medications on File Prior to Visit  Medication Sig Dispense Refill  . Ergocalciferol (VITAMIN D2) 50 MCG (2000 UT) TABS Take 1 capsule by mouth daily. 30 tablet 0  . Vitamin D, Ergocalciferol, (DRISDOL) 1.25 MG (50000 UNIT) CAPS capsule Take one capsule by mouth once a week for 12 weeks 4 capsule 3   No current facility-administered medications on file prior to visit.    Allergies  Allergen Reactions  . Ampicillin Anaphylaxis    DID THE REACTION INVOLVE: Swelling of the face/tongue/throat, SOB, or low BP? Yes Sudden or severe rash/hives, skin peeling, or the inside of the mouth or nose? Yes Did it require medical treatment? Yes When did it last happen?less than 10 yrs If all above answers are "NO", may proceed with cephalosporin use.   . Banana Anaphylaxis  . Latex Hives, Shortness Of Breath, Itching and Rash  . Morphine And Related Hives, Shortness Of Breath, Itching and Nausea And Vomiting    headache  . Other Anaphylaxis    Just pecans  . Penicillins Hives and  Shortness Of Breath    Has patient had a PCN reaction causing immediate rash, facial/tongue/throat swelling, SOB or lightheadedness with hypotension: Yes Has patient had a PCN reaction causing severe rash involving mucus membranes or skin necrosis: No Has patient had a PCN reaction that required hospitalization No Has patient had a PCN reaction occurring within the last 10 years: No If all of the above answers are "NO", then may proceed with Cephalosporin use.   . Shrimp [Shellfish Allergy] Anaphylaxis    Just shrimp  . Hibiclens [Chlorhexidine Gluconate] Itching    Stinging, burning     Family History  Problem Relation Age of Onset  . Healthy Mother   . Healthy Father   . Diabetes Paternal Recruitment consultant  . Healthy Brother        x1  . Healthy Sister        x2  . Cancer Neg Hx   . Heart disease Neg Hx   . Hypercalcemia Neg Hx     Social History   Socioeconomic History  . Marital status: Single    Spouse name: Not on file  . Number of children: 0  . Years of education: Not on file  . Highest education level: Not on file  Occupational History  . Occupation: Retail banker: [employer]  . Occupation: Technical brewer    Comment: Chiropractor  Tobacco Use  . Smoking status: Never  Smoker  . Smokeless tobacco: Never Used  Vaping Use  . Vaping Use: Never used  Substance and Sexual Activity  . Alcohol use: No    Alcohol/week: 0.0 standard drinks  . Drug use: No  . Sexual activity: Not Currently  Other Topics Concern  . Not on file  Social History Narrative  . Not on file   Social Determinants of Health   Financial Resource Strain:   . Difficulty of Paying Living Expenses: Not on file  Food Insecurity:   . Worried About Programme researcher, broadcasting/film/video in the Last Year: Not on file  . Ran Out of Food in the Last Year: Not on file  Transportation Needs:   . Lack of Transportation (Medical): Not on file  . Lack of Transportation (Non-Medical): Not on  file  Physical Activity:   . Days of Exercise per Week: Not on file  . Minutes of Exercise per Session: Not on file  Stress:   . Feeling of Stress : Not on file  Social Connections:   . Frequency of Communication with Friends and Family: Not on file  . Frequency of Social Gatherings with Friends and Family: Not on file  . Attends Religious Services: Not on file  . Active Member of Clubs or Organizations: Not on file  . Attends Banker Meetings: Not on file  . Marital Status: Not on file    Review of Systems - See HPI.  All other ROS are negative.  BP 140/82   Pulse 85   Temp 98.1 F (36.7 C) (Temporal)   Resp 16   Ht 6\' 3"  (1.905 m)   Wt 208 lb (94.3 kg)   SpO2 99%   BMI 26.00 kg/m   Physical Exam Vitals reviewed.  Constitutional:      Appearance: Normal appearance.  Cardiovascular:     Rate and Rhythm: Normal rate and regular rhythm.  Pulmonary:     Effort: Pulmonary effort is normal.  Musculoskeletal:     Cervical back: Neck supple.  Skin:    Comments: No rash noted. Areas about 9 cm x 8 cm of postinflammatory hyperpigmentation and dryness noted in bilateral axillary regions.   Neurological:     General: No focal deficit present.     Mental Status: He is alert and oriented to person, place, and time.     Recent Results (from the past 2160 hour(s))  Vitamin D (25 hydroxy)     Status: Abnormal   Collection Time: 11/02/19  9:11 AM  Result Value Ref Range   VITD 23.22 (L) 30.00 - 100.00 ng/mL  TSH     Status: None   Collection Time: 11/02/19  9:11 AM  Result Value Ref Range   TSH 1.92 0.35 - 4.50 uIU/mL  Magnesium     Status: None   Collection Time: 11/02/19  9:11 AM  Result Value Ref Range   Magnesium 2.0 1.5 - 2.5 mg/dL  Lipase     Status: None   Collection Time: 11/02/19  9:11 AM  Result Value Ref Range   Lipase 13.0 11 - 59 U/L  Hemoglobin A1c     Status: None   Collection Time: 11/02/19  9:11 AM  Result Value Ref Range   Hgb A1c MFr  Bld 6.2 4.6 - 6.5 %    Comment: Glycemic Control Guidelines for People with Diabetes:Non Diabetic:  <6%Goal of Therapy: <7%Additional Action Suggested:  >8%   CBC with Differential/Platelet     Status: None  Collection Time: 11/02/19  9:11 AM  Result Value Ref Range   WBC 6.8 4.0 - 10.5 K/uL   RBC 4.83 4.22 - 5.81 Mil/uL   Hemoglobin 14.6 13.0 - 17.0 g/dL   HCT 28.3 39 - 52 %   MCV 88.9 78.0 - 100.0 fl   MCHC 34.0 30.0 - 36.0 g/dL   RDW 15.1 76.1 - 60.7 %   Platelets 234.0 150 - 400 K/uL   Neutrophils Relative % 62.7 43 - 77 %   Lymphocytes Relative 25.5 12 - 46 %   Monocytes Relative 7.6 3 - 12 %   Eosinophils Relative 3.5 0 - 5 %   Basophils Relative 0.7 0 - 3 %   Neutro Abs 4.3 1.4 - 7.7 K/uL   Lymphs Abs 1.7 0.7 - 4.0 K/uL   Monocytes Absolute 0.5 0.1 - 1.0 K/uL   Eosinophils Absolute 0.2 0.0 - 0.7 K/uL   Basophils Absolute 0.0 0.0 - 0.1 K/uL  B12     Status: None   Collection Time: 11/02/19  9:11 AM  Result Value Ref Range   Vitamin B-12 227 211 - 911 pg/mL  Comprehensive metabolic panel     Status: None   Collection Time: 11/02/19  9:11 AM  Result Value Ref Range   Sodium 138 135 - 145 mEq/L   Potassium 5.0 3.5 - 5.1 mEq/L   Chloride 104 96 - 112 mEq/L   CO2 27 19 - 32 mEq/L   Glucose, Bld 94 70 - 99 mg/dL   BUN 14 6 - 23 mg/dL   Creatinine, Ser 3.71 0.40 - 1.50 mg/dL   Total Bilirubin 0.6 0.2 - 1.2 mg/dL   Alkaline Phosphatase 44 39 - 117 U/L   AST 15 0 - 37 U/L   ALT 13 0 - 53 U/L   Total Protein 7.1 6.0 - 8.3 g/dL   Albumin 4.1 3.5 - 5.2 g/dL   GFR 06.26 >94.85 mL/min   Calcium 9.2 8.4 - 10.5 mg/dL    Assessment/Plan: 1. Adverse effect of drug, initial encounter Significant drug rash with questionable mild SJS due to sloughing of underarm skin. All symptoms resolved with discontinuation of medication. Rapaflo added to allergies list. Mild postinflammatory hyperpigmentation noted of axilla bilaterally. Supportive measures and skin care discussed with patient.    2. Benign prostatic hyperplasia with urinary hesitancy Off of Rapaflo, patient notes no recurrence of symptoms. Notes still having great urinary flow without hesitancy, post-void dribbling or nocturia. Last PSA in normal range. Will monitor for any recurrence of symptoms. If developing, will refer to Urology for further evaluation and management.   This visit occurred during the SARS-CoV-2 public health emergency.  Safety protocols were in place, including screening questions prior to the visit, additional usage of staff PPE, and extensive cleaning of exam room while observing appropriate contact time as indicated for disinfecting solutions.     Piedad Climes, PA-C

## 2020-07-23 ENCOUNTER — Telehealth: Payer: Self-pay

## 2020-07-23 NOTE — Telephone Encounter (Signed)
Okay to schedule NP appt with Dr. Paz. Thank you.  

## 2020-07-23 NOTE — Telephone Encounter (Signed)
Please advise 

## 2020-07-23 NOTE — Telephone Encounter (Signed)
That is okay, thank you 

## 2020-07-23 NOTE — Telephone Encounter (Signed)
-----   Message from Celestia Khat sent at 07/23/2020  3:42 PM EDT ----- Regarding: TOC from Malva Cogan, [place] Hello Dr. Drue Novel,  The abovementioned patient called today requesting a TOC to you since Malva Cogan, [place], is no longer practicing at [facility].  Please let me know if you are willing to take him on or not as a TOC.  Thank you,  Victorino Dike

## 2020-07-25 NOTE — Telephone Encounter (Signed)
Left voice mail to schedule appt

## 2020-07-29 NOTE — Telephone Encounter (Signed)
Appt scheduled 09/03/20.

## 2020-09-03 ENCOUNTER — Other Ambulatory Visit: Payer: Self-pay

## 2020-09-03 ENCOUNTER — Encounter: Payer: Self-pay | Admitting: Internal Medicine

## 2020-09-03 ENCOUNTER — Ambulatory Visit (INDEPENDENT_AMBULATORY_CARE_PROVIDER_SITE_OTHER): Payer: 59 | Admitting: Internal Medicine

## 2020-09-03 VITALS — BP 132/80 | HR 75 | Temp 97.5°F | Resp 16 | Ht 75.0 in | Wt 211.1 lb

## 2020-09-03 DIAGNOSIS — R1030 Lower abdominal pain, unspecified: Secondary | ICD-10-CM | POA: Diagnosis not present

## 2020-09-03 DIAGNOSIS — E559 Vitamin D deficiency, unspecified: Secondary | ICD-10-CM | POA: Diagnosis not present

## 2020-09-03 DIAGNOSIS — R0600 Dyspnea, unspecified: Secondary | ICD-10-CM

## 2020-09-03 DIAGNOSIS — R399 Unspecified symptoms and signs involving the genitourinary system: Secondary | ICD-10-CM

## 2020-09-03 DIAGNOSIS — R0609 Other forms of dyspnea: Secondary | ICD-10-CM

## 2020-09-03 LAB — POC URINALSYSI DIPSTICK (AUTOMATED)
Bilirubin, UA: NEGATIVE
Blood, UA: NEGATIVE
Glucose, UA: NEGATIVE
Ketones, UA: NEGATIVE
Leukocytes, UA: NEGATIVE
Nitrite, UA: NEGATIVE
Protein, UA: NEGATIVE
Spec Grav, UA: 1.02 (ref 1.010–1.025)
Urobilinogen, UA: 0.2 E.U./dL
pH, UA: 6 (ref 5.0–8.0)

## 2020-09-03 NOTE — Patient Instructions (Signed)
Start a probiotic  Start over-the-counter vitamin D3 1000 units: 2 tablets daily   GO TO THE LAB : Get the blood work     GO TO THE FRONT DESK, PLEASE SCHEDULE YOUR APPOINTMENTS Come back for   a checkup in 3 months

## 2020-09-03 NOTE — Progress Notes (Signed)
Subjective:    Patient ID: Gregory Sweeney, male    DOB: Jul 23, 1960, 60 y.o.   MRN: [id number]  DOS:  09/03/2020 Type of visit - description: Transferring to this location.  Multiple symptoms:  1 year history of lower abdominal and low back discomfort. Symptoms are random, can be only at lower abd, lower back or both at the same time. Typically they decrease when he takes Mylanta or has a bowel movement.  Denies fever chills No weight loss. No nausea, vomiting, no blood in the stools.  Color of the stool is normal. No heartburn but occasionally has dysphagia to solids.  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 Now he noticed that he is short of breath shortly after start his work but is nevertheless able to finish. He denies chest pain, dizziness, cough, no wheezing.  History of BPH: Complain of urinary stream been slow but denies dysuria, gross hematuria or difficulty urinating.  This is not a new symptom.    Review of Systems See above   Past Medical History:  Diagnosis Date   Appendicitis    Bowel obstruction (HCC)    Gastric ulcer    GERD (gastroesophageal reflux disease)    History of kidney stones    Hypertension    not on meds     Past Surgical History:  Procedure Laterality Date   APPENDECTOMY     BALLOON DILATION N/A 05/24/2019   Procedure: BALLOON DILATION;  Surgeon: Jeani Hawking, MD;  Location: [facility] ENDOSCOPY;  Service: Endoscopy;  Laterality: N/A;   BOWEL OBSTRUCTION     CHOLECYSTECTOMY     ESOPHAGEAL DILATION     ESOPHAGOGASTRODUODENOSCOPY (EGD) WITH PROPOFOL N/A 07/21/2013   Procedure: ESOPHAGOGASTRODUODENOSCOPY (EGD) WITH PROPOFOL;  Surgeon: Theda Belfast, MD;  Location: [facility] ENDOSCOPY;  Service: Endoscopy;  Laterality: N/A;   ESOPHAGOGASTRODUODENOSCOPY (EGD) WITH PROPOFOL N/A 05/24/2019   Procedure: ESOPHAGOGASTRODUODENOSCOPY (EGD) WITH PROPOFOL;  Surgeon: Jeani Hawking, MD;  Location[facility] ENDOSCOPY;  Service:  Endoscopy;  Laterality: N/A;   FOREIGN BODY REMOVAL  05/24/2019   Procedure: FOREIGN BODY REMOVAL;  Surgeon: Jeani Hawking, MD;  Location: [facility] ENDOSCOPY;  Service: Endoscopy;;   KIDNEY STONE SURGERY     PARATHYROIDECTOMY N/A 12/30/2015   Procedure: NECK EXPLORATION AND PARATHYROIDECTOMY;  Surgeon: Darnell Level, MD;  Location: [facility] ORS;  Service: General;  Laterality: N/A;   Social History   Socioeconomic History   Marital status: Single    Spouse name: Not on file   Number of children: 0   Years of education: Not on file   Highest education level: Not on file  Occupational History   Occupation: Mail carrier    Employer: [employer]   Occupation: Representative    Comment: Chiropractor  Tobacco Use   Smoking status: Never   Smokeless tobacco: Never  Vaping Use   Vaping Use: Never used  Substance and Sexual Activity   Alcohol use: No    Alcohol/week: 0.0 standard drinks   Drug use: No   Sexual activity: Not Currently  Other Topics Concern   Not on file  Social History Narrative   Lives by himself, has a g-friend    Social Determinants of Corporate investment banker Strain: Not on file  Food Insecurity: Not on file  Transportation Needs: Not on file  Physical Activity: Not on file  Stress: Not on file  Social Connections: Not on  file  Intimate Partner Violence: Not on file    Allergies as of 09/03/2020       Reactions   Ampicillin Anaphylaxis   DID THE REACTION INVOLVE: Swelling of the face/tongue/throat, SOB, or low BP? Yes Sudden or severe rash/hives, skin peeling, or the inside of the mouth or nose? Yes Did it require medical treatment? Yes When did it last happen?     less than 10 yrs If all above answers are "NO", may proceed with cephalosporin use.   Banana Anaphylaxis   Latex Hives, Shortness Of Breath, Itching, Rash   Morphine And Related Hives, Shortness Of Breath, Itching, Nausea And Vomiting   headache   Other Anaphylaxis   Just pecans    Penicillins Hives, Shortness Of Breath   Has patient had a PCN reaction causing immediate rash, facial/tongue/throat swelling, SOB or lightheadedness with hypotension: Yes Has patient had a PCN reaction causing severe rash involving mucus membranes or skin necrosis: No Has patient had a PCN reaction that required hospitalization No Has patient had a PCN reaction occurring within the last 10 years: No If all of the above answers are "NO", then may proceed with Cephalosporin use.   Rapaflo [silodosin]    SJS   Shrimp [shellfish Allergy] Anaphylaxis   Just shrimp   Hibiclens [chlorhexidine Gluconate] Itching   Stinging, burning         Medication List        Accurate as of September 03, 2020  9:48 PM. If you have any questions, ask your nurse or doctor.          STOP taking these medications    Vitamin D (Ergocalciferol) 1.25 MG (50000 UNIT) Caps capsule Commonly known as: DRISDOL Stopped by: Willow Ora, MD   Vitamin D2 50 MCG (2000 UT) Tabs Stopped by: Willow Ora, MD       TAKE these medications    Vitamin D 50 MCG (2000 UT) Caps Take by mouth.           Objective:   Physical Exam BP 132/80 (BP Location: Left Arm, Patient Position: Sitting, Cuff Size: Normal)   Pulse 75   Temp (!) 97.5 F (36.4 C) (Oral)   Resp 16   Ht 6\' 3"  (1.905 m)   Wt 211 lb 2 oz (95.8 kg)   SpO2 97%   BMI 26.39 kg/m  General:   Well developed, NAD, BMI noted.  HEENT:  Normocephalic . Face symmetric, atraumatic Lungs:  CTA B Normal respiratory effort, no intercostal retractions, no accessory muscle use. Heart: RRR,  no murmur.  Abdomen:  Not distended, soft, non-tender. No rebound or rigidity.  Extensive well-healed surgical scars noted skin: Not pale. Not jaundice DRE: Normal sphincter tone, no stools, prostate gland normal Lower extremities: no pretibial edema bilaterally  Neurologic:  alert & oriented X3.  Speech normal, gait appropriate for age and unassisted Psych--   Cognition and judgment appear intact.  Cooperative with normal attention span and concentration.  Behavior appropriate. No anxious or depressed appearing.     Assessment      Assessment HTN BPH GI: Dr. Schatzki ring (sees GI, s/p dilatation) History of exploratory laparotomy at age 34 due to gastric ulcer, h/o  SBO Vitamin D deficiency Urolithiasis  PLAN Lower abdominal and lower back discomfort: As described above, symptoms are somewhat atypical for any specific syndrome, had a colonoscopy 07/2018 showing diverticuli but no polyps. Plan: CBC to rule out anemia, CMP, TSH.  Try Probiotics. Shortness  of breath? As described above, for 6 months, EKG today: NSR. No FH CAD, non-smoker, although he gets short of breath he is a still able to work on his 2 acres of land.  Rec observation for now, reassess on RTC. LUTS: Urinary stream is not as strong as before, history of BPH, DRE normal.  Check PSA and UA Vitamin D deficiency: Recommend 2000 units daily Preventive care: Covid x 3 , rec booster  RTC 3 months  This visit occurred during the SARS-CoV-2 public health emergency.  Safety protocols were in place, including screening questions prior to the visit, additional usage of staff PPE, and extensive cleaning of exam room while observing appropriate contact time as indicated for disinfecting solutions.

## 2020-09-04 LAB — CBC WITH DIFFERENTIAL/PLATELET
Basophils Absolute: 0 10*3/uL (ref 0.0–0.1)
Basophils Relative: 0.7 % (ref 0.0–3.0)
Eosinophils Absolute: 0.4 10*3/uL (ref 0.0–0.7)
Eosinophils Relative: 5.9 % — ABNORMAL HIGH (ref 0.0–5.0)
HCT: 42.2 % (ref 39.0–52.0)
Hemoglobin: 14.3 g/dL (ref 13.0–17.0)
Lymphocytes Relative: 30.2 % (ref 12.0–46.0)
Lymphs Abs: 1.9 10*3/uL (ref 0.7–4.0)
MCHC: 33.8 g/dL (ref 30.0–36.0)
MCV: 89.2 fl (ref 78.0–100.0)
Monocytes Absolute: 0.5 10*3/uL (ref 0.1–1.0)
Monocytes Relative: 7.6 % (ref 3.0–12.0)
Neutro Abs: 3.4 10*3/uL (ref 1.4–7.7)
Neutrophils Relative %: 55.6 % (ref 43.0–77.0)
Platelets: 216 10*3/uL (ref 150.0–400.0)
RBC: 4.73 Mil/uL (ref 4.22–5.81)
RDW: 13.7 % (ref 11.5–15.5)
WBC: 6.2 10*3/uL (ref 4.0–10.5)

## 2020-09-04 LAB — URINALYSIS, ROUTINE W REFLEX MICROSCOPIC
Bilirubin Urine: NEGATIVE
Hgb urine dipstick: NEGATIVE
Ketones, ur: NEGATIVE
Leukocytes,Ua: NEGATIVE
Nitrite: NEGATIVE
RBC / HPF: NONE SEEN (ref 0–?)
Specific Gravity, Urine: 1.015 (ref 1.000–1.030)
Total Protein, Urine: NEGATIVE
Urine Glucose: NEGATIVE
Urobilinogen, UA: 0.2 (ref 0.0–1.0)
WBC, UA: NONE SEEN (ref 0–?)
pH: 6 (ref 5.0–8.0)

## 2020-09-04 LAB — COMPREHENSIVE METABOLIC PANEL
ALT: 16 U/L (ref 0–53)
AST: 17 U/L (ref 0–37)
Albumin: 4.1 g/dL (ref 3.5–5.2)
Alkaline Phosphatase: 49 U/L (ref 39–117)
BUN: 12 mg/dL (ref 6–23)
CO2: 31 mEq/L (ref 19–32)
Calcium: 9.6 mg/dL (ref 8.4–10.5)
Chloride: 103 mEq/L (ref 96–112)
Creatinine, Ser: 1.36 mg/dL (ref 0.40–1.50)
GFR: 55.8 mL/min — ABNORMAL LOW (ref >60.00)
Glucose, Bld: 89 mg/dL (ref 70–99)
Potassium: 4.1 mEq/L (ref 3.5–5.1)
Sodium: 140 mEq/L (ref 135–145)
Total Bilirubin: 0.6 mg/dL (ref 0.2–1.2)
Total Protein: 6.7 g/dL (ref 6.0–8.3)

## 2020-09-04 LAB — TSH: TSH: 1.62 u[IU]/mL (ref 0.35–5.50)

## 2020-09-04 LAB — PSA: PSA: 0.64 ng/mL (ref 0.10–4.00)

## 2020-12-03 ENCOUNTER — Other Ambulatory Visit: Payer: Self-pay

## 2020-12-03 ENCOUNTER — Ambulatory Visit (INDEPENDENT_AMBULATORY_CARE_PROVIDER_SITE_OTHER): Payer: 59 | Admitting: Internal Medicine

## 2020-12-03 ENCOUNTER — Encounter: Payer: Self-pay | Admitting: Internal Medicine

## 2020-12-03 VITALS — BP 156/78 | HR 75 | Temp 98.1°F | Resp 16 | Ht 75.0 in | Wt 205.5 lb

## 2020-12-03 DIAGNOSIS — Z09 Encounter for follow-up examination after completed treatment for conditions other than malignant neoplasm: Secondary | ICD-10-CM | POA: Insufficient documentation

## 2020-12-03 DIAGNOSIS — Z23 Encounter for immunization: Secondary | ICD-10-CM

## 2020-12-03 DIAGNOSIS — I10 Essential (primary) hypertension: Secondary | ICD-10-CM | POA: Diagnosis not present

## 2020-12-03 MED ORDER — AMLODIPINE BESYLATE 5 MG PO TABS: 5.0000 mg | ORAL_TABLET | Freq: Every day | ORAL | 6 refills | Status: DC

## 2020-12-03 NOTE — Progress Notes (Signed)
Subjective:    Patient ID: Gregory Sweeney, male    DOB: Nov 20, 1960, 60 y.o.   MRN: [id number]  DOS:  12/03/2020 Type of visit - description: Here for BP evaluation  Patient went to have DOT exam yesterday: Initial systolic BP was 210. BP was rechecked: 190/114, 178/109.  The patient admitted that he was "terrified" about not passing his DOT as his job depends on it. At the time, he did not have any chest pain or difficulty breathing. No headache Today he reports he is feeling great.  Review of Systems See above   Past Medical History:  Diagnosis Date   Appendicitis    Bowel obstruction (HCC)    Gastric ulcer    GERD (gastroesophageal reflux disease)    History of kidney stones    Hypertension    not on meds     Past Surgical History:  Procedure Laterality Date   APPENDECTOMY     BALLOON DILATION N/A 05/24/2019   Procedure: BALLOON DILATION;  Surgeon: Jeani Hawking, MD;  Location: [facility] ENDOSCOPY;  Service: Endoscopy;  Laterality: N/A;   BOWEL OBSTRUCTION     CHOLECYSTECTOMY     ESOPHAGEAL DILATION     ESOPHAGOGASTRODUODENOSCOPY (EGD) WITH PROPOFOL N/A 07/21/2013   Procedure: ESOPHAGOGASTRODUODENOSCOPY (EGD) WITH PROPOFOL;  Surgeon: Theda Belfast, MD;  Location: [facility] ENDOSCOPY;  Service: Endoscopy;  Laterality: N/A;   ESOPHAGOGASTRODUODENOSCOPY (EGD) WITH PROPOFOL N/A 05/24/2019   Procedure: ESOPHAGOGASTRODUODENOSCOPY (EGD) WITH PROPOFOL;  Surgeon: Jeani Hawking, MD;  Location: [facility] ENDOSCOPY;  Service: Endoscopy;  Laterality: N/A;   FOREIGN BODY REMOVAL  05/24/2019   Procedure: FOREIGN BODY REMOVAL;  Surgeon: Jeani Hawking, MD;  Location: [facility] ENDOSCOPY;  Service: Endoscopy;;   KIDNEY STONE SURGERY     PARATHYROIDECTOMY N/A 12/30/2015   Procedure: NECK EXPLORATION AND PARATHYROIDECTOMY;  Surgeon: Darnell Level, MD;  Location: [facility] ORS;  Service: General;  Laterality: N/A;    Allergies as of 12/03/2020       Reactions   Ampicillin Anaphylaxis   DID THE REACTION INVOLVE: Swelling  of the face/tongue/throat, SOB, or low BP? Yes Sudden or severe rash/hives, skin peeling, or the inside of the mouth or nose? Yes Did it require medical treatment? Yes When did it last happen?     less than 10 yrs If all above answers are "NO", may proceed with cephalosporin use.   Banana Anaphylaxis   Latex Hives, Shortness Of Breath, Itching, Rash   Morphine And Related Hives, Shortness Of Breath, Itching, Nausea And Vomiting   headache   Other Anaphylaxis   Just pecans   Penicillins Hives, Shortness Of Breath   Has patient had a PCN reaction causing immediate rash, facial/tongue/throat swelling, SOB or lightheadedness with hypotension: Yes Has patient had a PCN reaction causing severe rash involving mucus membranes or skin necrosis: No Has patient had a PCN reaction that required hospitalization No Has patient had a PCN reaction occurring within the last 10 years: No If all of the above answers are "NO", then may proceed with Cephalosporin use.   Rapaflo [silodosin]    SJS   Shrimp [shellfish Allergy] Anaphylaxis   Just shrimp   Hibiclens [chlorhexidine Gluconate] Itching   Stinging, burning         Medication List        Accurate as of December 03, 2020  8:14 PM. If you have any questions, ask your nurse or doctor.          amLODipine 5 MG tablet Commonly known as:  NORVASC Take 1 tablet (5 mg total) by mouth daily. Started by: Willow Ora, MD   Vitamin D 50 MCG (2000 UT) Caps Take by mouth.           Objective:   Physical Exam BP (!) 156/78 (BP Location: Left Arm, Patient Position: Sitting, Cuff Size: Normal)   Pulse 75   Temp 98.1 F (36.7 C) (Oral)   Resp 16   Ht 6\' 3"  (1.905 m)   Wt 205 lb 8 oz (93.2 kg)   SpO2 98%   BMI 25.69 kg/m  General:   Well developed, NAD, BMI noted. HEENT:  Normocephalic . Face symmetric, atraumatic Lungs:  CTA B Normal respiratory effort, no intercostal retractions, no accessory muscle use. Heart: RRR,  no murmur.   Lower extremities: no pretibial edema bilaterally  Skin: Not pale. Not jaundice Neurologic:  alert & oriented X3.  Speech normal, gait appropriate for age and unassisted Psych--  Cognition and judgment appear intact.  Cooperative with normal attention span and concentration.  Behavior appropriate. No anxious or depressed appearing.      Assessment    ASSESSMENT HTN BPH GI: Dr. Schatzki ring (sees GI, s/p dilatation) History of exploratory laparotomy at age 36 due to gastric ulcer, h/o  SBO Vitamin D deficiency Urolithiasis  PLAN HTN:  The first time he was told his BP was elevated was in 2007, he adjusted his diet and since then BP has been in the 120s, 130s. Yesterday, he was extremely anxious and the blood pressure was quite elevated at the DOT doctor office. Today it is 156/78. Evidently, he has a tendency to have high blood pressure given the readings yesterday. With talk about low-salt diet, monitor BPs and possibly medication.  He elected to start meds. Plan: Amlodipine 5 mg, monitor BPs, watch for edema, if BP becomes too low he knows to either stop amlodipine or decrease the dose. He has appointment with me next week.   This visit occurred during the SARS-CoV-2 public health emergency.  Safety protocols were in place, including screening questions prior to the visit, additional usage of staff PPE, and extensive cleaning of exam room while observing appropriate contact time as indicated for disinfecting solutions.

## 2020-12-03 NOTE — Assessment & Plan Note (Signed)
HTN:  The first time he was told his BP was elevated was in 2007, he adjusted his diet and since then BP has been in the 120s, 130s. Yesterday, he was extremely anxious and the blood pressure was quite elevated at the DOT doctor office. Today it is 156/78. Evidently, he has a tendency to have high blood pressure given the readings yesterday. With talk about low-salt diet, monitor BPs and possibly medication.  He elected to start meds. Plan: Amlodipine 5 mg, monitor BPs, watch for edema, if BP becomes too low he knows to either stop amlodipine or decrease the dose. He has appointment with me next week.

## 2020-12-03 NOTE — Patient Instructions (Addendum)
Recommend to proceed with the following vaccine at your pharmacy:  Covid #4   Start amlodipine 5 mg, 1 tablet every night.  Check the  blood pressure 3-4 times a week BP GOAL is between 110/65 and  135/85. If it is consistently higher or lower, let me know  Low-salt diet  Watch for swelling and fluid retention.     Low-Sodium Eating Plan Sodium, which is an element that makes up salt, helps you maintain a healthy balance of fluids in your body. Too much sodium can increase your blood pressure and cause fluid and waste to be held in your body. Your health care provider or dietitian may recommend following this plan if you have high blood pressure (hypertension), kidney disease, liver disease, or heart failure. Eating less sodium can help lower your blood pressure, reduce swelling, and protect your heart, liver, and kidneys. What are tips for following this plan? Reading food labels The Nutrition Facts label lists the amount of sodium in one serving of the food. If you eat more than one serving, you must multiply the listed amount of sodium by the number of servings. Choose foods with less than 140 mg of sodium per serving. Avoid foods with 300 mg of sodium or more per serving. Shopping  Look for lower-sodium products, often labeled as "low-sodium" or "no salt added." Always check the sodium content, even if foods are labeled as "unsalted" or "no salt added." Buy fresh foods. Avoid canned foods and pre-made or frozen meals. Avoid canned, cured, or processed meats. Buy breads that have less than 80 mg of sodium per slice. Cooking  Eat more home-cooked food and less restaurant, buffet, and fast food. Avoid adding salt when cooking. Use salt-free seasonings or herbs instead of table salt or sea salt. Check with your health care provider or pharmacist before using salt substitutes. Cook with plant-based oils, such as canola, sunflower, or olive oil. Meal planning When eating at a  restaurant, ask that your food be prepared with less salt or no salt, if possible. Avoid dishes labeled as brined, pickled, cured, smoked, or made with soy sauce, miso, or teriyaki sauce. Avoid foods that contain MSG (monosodium glutamate). MSG is sometimes added to [place] food, bouillon, and some canned foods. Make meals that can be grilled, baked, poached, roasted, or steamed. These are generally made with less sodium. General information Most people on this plan should limit their sodium intake to 1,500-2,000 mg (milligrams) of sodium each day. What foods should I eat? Fruits Fresh, frozen, or canned fruit. Fruit juice. Vegetables Fresh or frozen vegetables. "No salt added" canned vegetables. "No salt added" tomato sauce and paste. Low-sodium or reduced-sodium tomato and vegetable juice. Grains Low-sodium cereals, including oats, puffed wheat and rice, and shredded wheat. Low-sodium crackers. Unsalted rice. Unsalted pasta. Low-sodium bread. Whole-grain breads and whole-grain pasta. Meats and other proteins Fresh or frozen (no salt added) meat, poultry, seafood, and fish. Low-sodium canned tuna and salmon. Unsalted nuts. Dried peas, beans, and lentils without added salt. Unsalted canned beans. Eggs. Unsalted nut butters. Dairy Milk. Soy milk. Cheese that is naturally low in sodium, such as ricotta cheese, fresh mozzarella, or Swiss cheese. Low-sodium or reduced-sodium cheese. Cream cheese. Yogurt. Seasonings and condiments Fresh and dried herbs and spices. Salt-free seasonings. Low-sodium mustard and ketchup. Sodium-free salad dressing. Sodium-free light mayonnaise. Fresh or refrigerated horseradish. Lemon juice. Vinegar. Other foods Homemade, reduced-sodium, or low-sodium soups. Unsalted popcorn and pretzels. Low-salt or salt-free chips. The items listed above may not  be a complete list of foods and beverages you can eat. Contact a dietitian for more information. What foods should I  avoid? Vegetables Sauerkraut, pickled vegetables, and relishes. Olives. [place] fries. Onion rings. Regular canned vegetables (not low-sodium or reduced-sodium). Regular canned tomato sauce and paste (not low-sodium or reduced-sodium). Regular tomato and vegetable juice (not low-sodium or reduced-sodium). Frozen vegetables in sauces. Grains Instant hot cereals. Bread stuffing, pancake, and biscuit mixes. Croutons. Seasoned rice or pasta mixes. Noodle soup cups. Boxed or frozen macaroni and cheese. Regular salted crackers. Self-rising flour. Meats and other proteins Meat or fish that is salted, canned, smoked, spiced, or pickled. Precooked or cured meat, such as sausages or meat loaves. Tomasa Blase. Ham. Pepperoni. Hot dogs. Corned beef. Chipped beef. Salt pork. Jerky. Pickled herring. Anchovies and sardines. Regular canned tuna. Salted nuts. Dairy Processed cheese and cheese spreads. Hard cheeses. Cheese curds. Blue cheese. Feta cheese. String cheese. Regular cottage cheese. Buttermilk. Canned milk. Fats and oils Salted butter. Regular margarine. Ghee. Bacon fat. Seasonings and condiments Onion salt, garlic salt, seasoned salt, table salt, and sea salt. Canned and packaged gravies. Worcestershire sauce. Tartar sauce. Barbecue sauce. Teriyaki sauce. Soy sauce, including reduced-sodium. Steak sauce. Fish sauce. Oyster sauce. Cocktail sauce. Horseradish that you find on the shelf. Regular ketchup and mustard. Meat flavorings and tenderizers. Bouillon cubes. Hot sauce. Pre-made or packaged marinades. Pre-made or packaged taco seasonings. Relishes. Regular salad dressings. Salsa. Other foods Salted popcorn and pretzels. Corn chips and puffs. Potato and tortilla chips. Canned or dried soups. Pizza. Frozen entrees and pot pies. The items listed above may not be a complete list of foods and beverages you should avoid. Contact a dietitian for more information. Summary Eating less sodium can help lower your blood  pressure, reduce swelling, and protect your heart, liver, and kidneys. Most people on this plan should limit their sodium intake to 1,500-2,000 mg (milligrams) of sodium each day. Canned, boxed, and frozen foods are high in sodium. Restaurant foods, fast foods, and pizza are also very high in sodium. You also get sodium by adding salt to food. Try to cook at home, eat more fresh fruits and vegetables, and eat less fast food and canned, processed, or prepared foods. This information is not intended to replace advice given to you by your health care provider. Make sure you discuss any questions you have with your health care provider. Document Revised: 03/17/2019 Document Reviewed: 01/11/2019 Elsevier Patient Education  2022 [employer].

## 2020-12-13 ENCOUNTER — Ambulatory Visit: Payer: 59 | Attending: Internal Medicine

## 2020-12-13 ENCOUNTER — Ambulatory Visit (INDEPENDENT_AMBULATORY_CARE_PROVIDER_SITE_OTHER): Payer: 59 | Admitting: Internal Medicine

## 2020-12-13 ENCOUNTER — Other Ambulatory Visit: Payer: Self-pay

## 2020-12-13 ENCOUNTER — Encounter: Payer: Self-pay | Admitting: Internal Medicine

## 2020-12-13 VITALS — BP 144/76 | HR 77 | Temp 97.7°F | Resp 16 | Ht 75.0 in | Wt 211.0 lb

## 2020-12-13 DIAGNOSIS — Z23 Encounter for immunization: Secondary | ICD-10-CM

## 2020-12-13 DIAGNOSIS — R739 Hyperglycemia, unspecified: Secondary | ICD-10-CM

## 2020-12-13 DIAGNOSIS — I10 Essential (primary) hypertension: Secondary | ICD-10-CM

## 2020-12-13 LAB — LIPID PANEL
Cholesterol: 166 mg/dL (ref 0–200)
HDL: 42.9 mg/dL (ref >39.00)
LDL Cholesterol: 98 mg/dL (ref 0–99)
NonHDL: 123.27
Total CHOL/HDL Ratio: 4
Triglycerides: 125 mg/dL (ref 0.0–149.0)
VLDL: 25 mg/dL (ref 0.0–40.0)

## 2020-12-13 LAB — HEMOGLOBIN A1C: Hgb A1c MFr Bld: 6.1 % (ref 4.6–6.5)

## 2020-12-13 NOTE — Progress Notes (Deleted)
   Covid-19 Vaccination Clinic  Name:  Gregory Sweeney    MRN: [id number] DOB: 07-Nov-1960  12/13/2020  Gregory Sweeney was observed post Covid-19 immunization for 15 minutes without incident. He was provided with Vaccine Information Sheet and instruction to access the V-Safe system.   Gregory Sweeney was instructed to call 911 with any severe reactions post vaccine: Difficulty breathing  Swelling of face and throat  A fast heartbeat  A bad rash all over body  Dizziness and weakness    *** Covid vaccine administration is NOT RECORDED.  Must document administration and refresh note before signing ***

## 2020-12-13 NOTE — Patient Instructions (Addendum)
Continue amlodipine 5 mg 1 tablet daily unless your blood pressure is consistently below 110 or you feel poorly.  Check the  blood pressure 2 times a week BP GOAL is between 110/65 and  135/85. If it is consistently higher or lower, let me know  GO TO THE LAB : Get the blood work     GO TO THE FRONT DESK, PLEASE SCHEDULE YOUR APPOINTMENTS Come back for   physical exam in 3-4 months

## 2020-12-13 NOTE — Progress Notes (Signed)
   Covid-19 Vaccination Clinic  Name:  Gregory Sweeney    MRN: [id number] DOB: 1960-04-25  12/13/2020  Gregory Sweeney was observed post Covid-19 immunization for 15 minutes without incident. He was provided with Vaccine Information Sheet and instruction to access the V-Safe system.   Gregory Sweeney was instructed to call 911 with any severe reactions post vaccine: Difficulty breathing  Swelling of face and throat  A fast heartbeat  A bad rash all over body  Dizziness and weakness   Immunizations Administered     Name Date Dose VIS Date Route   Pfizer Covid-19 Vaccine Bivalent Booster 12/13/2020  9:45 AM 0.3 mL 10/23/2020 Intramuscular   Manufacturer: [employer], [employer]   Lot: [id number]   NDC: [phone]

## 2020-12-13 NOTE — Progress Notes (Signed)
Subjective:    Patient ID: Gregory Sweeney, male    DOB: 04-05-1960, 60 y.o.   MRN: [id number]  DOS:  12/13/2020 Type of visit - description: Follow-up  Follow-up for hypertension, he started amlodipine. Follows a low-salt diet. Ambulatory BPs at some point when SBP at some point was 118 and he felt minimally sleepy so she cut down to half tablet daily.  Review of Systems No chest pain or difficulty breathing No edema    Past Medical History:  Diagnosis Date   Appendicitis    Bowel obstruction (HCC)    Gastric ulcer    GERD (gastroesophageal reflux disease)    History of kidney stones    Hypertension    not on meds     Past Surgical History:  Procedure Laterality Date   APPENDECTOMY     BALLOON DILATION N/A 05/24/2019   Procedure: BALLOON DILATION;  Surgeon: Jeani Hawking, MD;  Location: [facility] ENDOSCOPY;  Service: Endoscopy;  Laterality: N/A;   BOWEL OBSTRUCTION     CHOLECYSTECTOMY     ESOPHAGEAL DILATION     ESOPHAGOGASTRODUODENOSCOPY (EGD) WITH PROPOFOL N/A 07/21/2013   Procedure: ESOPHAGOGASTRODUODENOSCOPY (EGD) WITH PROPOFOL;  Surgeon: Theda Belfast, MD;  Location: [facility] ENDOSCOPY;  Service: Endoscopy;  Laterality: N/A;   ESOPHAGOGASTRODUODENOSCOPY (EGD) WITH PROPOFOL N/A 05/24/2019   Procedure: ESOPHAGOGASTRODUODENOSCOPY (EGD) WITH PROPOFOL;  Surgeon: Jeani Hawking, MD;  Location: [facility] ENDOSCOPY;  Service: Endoscopy;  Laterality: N/A;   FOREIGN BODY REMOVAL  05/24/2019   Procedure: FOREIGN BODY REMOVAL;  Surgeon: Jeani Hawking, MD;  Location: [facility] ENDOSCOPY;  Service: Endoscopy;;   KIDNEY STONE SURGERY     PARATHYROIDECTOMY N/A 12/30/2015   Procedure: NECK EXPLORATION AND PARATHYROIDECTOMY;  Surgeon: Darnell Level, MD;  Location: [facility] ORS;  Service: General;  Laterality: N/A;    Allergies as of 12/13/2020       Reactions   Ampicillin Anaphylaxis   DID THE REACTION INVOLVE: Swelling of the face/tongue/throat, SOB, or low BP? Yes Sudden or severe rash/hives, skin peeling, or the  inside of the mouth or nose? Yes Did it require medical treatment? Yes When did it last happen?     less than 10 yrs If all above answers are "NO", may proceed with cephalosporin use.   Banana Anaphylaxis   Latex Hives, Shortness Of Breath, Itching, Rash   Morphine And Related Hives, Shortness Of Breath, Itching, Nausea And Vomiting   headache   Other Anaphylaxis   Just pecans   Penicillins Hives, Shortness Of Breath   Has patient had a PCN reaction causing immediate rash, facial/tongue/throat swelling, SOB or lightheadedness with hypotension: Yes Has patient had a PCN reaction causing severe rash involving mucus membranes or skin necrosis: No Has patient had a PCN reaction that required hospitalization No Has patient had a PCN reaction occurring within the last 10 years: No If all of the above answers are "NO", then may proceed with Cephalosporin use.   Rapaflo [silodosin]    SJS   Shrimp [shellfish Allergy] Anaphylaxis   Just shrimp   Hibiclens [chlorhexidine Gluconate] Itching   Stinging, burning         Medication List        Accurate as of December 13, 2020  8:37 AM. If you have any questions, ask your nurse or doctor.          amLODipine 5 MG tablet Commonly known as: NORVASC Take 1 tablet (5 mg total) by mouth daily.   Vitamin D 50 MCG (2000 UT) Caps  Take by mouth.           Objective:   Physical Exam BP 140/78 (BP Location: Left Arm, Patient Position: Sitting, Cuff Size: Normal)   Pulse 77   Temp 97.7 F (36.5 C) (Oral)   Resp 16   Ht 6\' 3"  (1.905 m)   Wt 211 lb (95.7 kg)   SpO2 98%   BMI 26.37 kg/m  General:   Well developed, NAD, BMI noted. HEENT:  Normocephalic . Face symmetric, atraumatic Lungs:  CTA B Normal respiratory effort, no intercostal retractions, no accessory muscle use. Heart: RRR,  no murmur.  Lower extremities: no pretibial edema bilaterally  Skin: Not pale. Not jaundice Neurologic:  alert & oriented X3.  Speech normal,  gait appropriate for age and unassisted Psych--  Cognition and judgment appear intact.  Cooperative with normal attention span and concentration.  Behavior appropriate. No anxious or depressed appearing.      Assessment     ASSESSMENT (transferred to this location 08-2020) HTN BPH GI: Dr. 12-02-1970 Schatzki ring (sees GI, s/p dilatation) History of exploratory laparotomy at age 33 due to gastric ulcer, h/o  SBO Vitamin D deficiency Urolithiasis  PLAN HTN: At the last visit he started amlodipine 5 mg daily, systolic BP was 108 one day so he cut down to 2.5 mg daily. BP here 140/78, recheck 144/76. Plan: Check FLP, continue amlodipine 5 mg, only cut down to 2.5 mg if BP is consistently less than 110 or if he truly feels poorly with the low blood pressure. Hyperglycemia: Noted on chart review, check A1c Preventive care, recommend COVID booster. RTC 4 months CPX   This visit occurred during the SARS-CoV-2 public health emergency.  Safety protocols were in place, including screening questions prior to the visit, additional usage of staff PPE, and extensive cleaning of exam room while observing appropriate contact time as indicated for disinfecting solutions.

## 2020-12-15 NOTE — Assessment & Plan Note (Signed)
HTN: At the last visit he started amlodipine 5 mg daily, systolic BP was 108 one day so he cut down to 2.5 mg daily. BP here 140/78, recheck 144/76. Plan: Check FLP, continue amlodipine 5 mg, only cut down to 2.5 mg if BP is consistently less than 110 or if he truly feels poorly with the low blood pressure. Hyperglycemia: Noted on chart review, check A1c Preventive care, recommend COVID booster. RTC 4 months CPX

## 2020-12-21 ENCOUNTER — Other Ambulatory Visit: Payer: Self-pay

## 2020-12-21 ENCOUNTER — Emergency Department (HOSPITAL_BASED_OUTPATIENT_CLINIC_OR_DEPARTMENT_OTHER)
Admission: EM | Admit: 2020-12-21 | Discharge: 2020-12-21 | Disposition: A | Discharge status: Home / Self Care | Payer: 59 | Attending: Emergency Medicine | Admitting: Emergency Medicine

## 2020-12-21 ENCOUNTER — Encounter (HOSPITAL_BASED_OUTPATIENT_CLINIC_OR_DEPARTMENT_OTHER): Payer: Self-pay

## 2020-12-21 DIAGNOSIS — R300 Dysuria: Secondary | ICD-10-CM

## 2020-12-21 DIAGNOSIS — K644 Residual hemorrhoidal skin tags: Secondary | ICD-10-CM | POA: Insufficient documentation

## 2020-12-21 DIAGNOSIS — Z91040 Latex allergy status: Secondary | ICD-10-CM | POA: Insufficient documentation

## 2020-12-21 DIAGNOSIS — I10 Essential (primary) hypertension: Secondary | ICD-10-CM | POA: Diagnosis not present

## 2020-12-21 DIAGNOSIS — L299 Pruritus, unspecified: Secondary | ICD-10-CM | POA: Diagnosis not present

## 2020-12-21 DIAGNOSIS — Z79899 Other long term (current) drug therapy: Secondary | ICD-10-CM | POA: Diagnosis not present

## 2020-12-21 LAB — CBC WITH DIFFERENTIAL/PLATELET
Abs Immature Granulocytes: 0.01 10*3/uL (ref 0.00–0.07)
Basophils Absolute: 0 10*3/uL (ref 0.0–0.1)
Basophils Relative: 1 %
Eosinophils Absolute: 0.3 10*3/uL (ref 0.0–0.5)
Eosinophils Relative: 4 %
HCT: 44.8 % (ref 39.0–52.0)
Hemoglobin: 15.2 g/dL (ref 13.0–17.0)
Immature Granulocytes: 0 %
Lymphocytes Relative: 27 %
Lymphs Abs: 1.7 10*3/uL (ref 0.7–4.0)
MCH: 30.2 pg (ref 26.0–34.0)
MCHC: 33.9 g/dL (ref 30.0–36.0)
MCV: 88.9 fL (ref 80.0–100.0)
Monocytes Absolute: 0.5 10*3/uL (ref 0.1–1.0)
Monocytes Relative: 8 %
Neutro Abs: 3.8 10*3/uL (ref 1.7–7.7)
Neutrophils Relative %: 60 %
Platelets: 260 10*3/uL (ref 150–400)
RBC: 5.04 MIL/uL (ref 4.22–5.81)
RDW: 12.6 % (ref 11.5–15.5)
WBC: 6.3 10*3/uL (ref 4.0–10.5)
nRBC: 0 % (ref 0.0–0.2)

## 2020-12-21 LAB — COMPREHENSIVE METABOLIC PANEL
ALT: 19 U/L (ref 0–44)
AST: 21 U/L (ref 15–41)
Albumin: 4 g/dL (ref 3.5–5.0)
Alkaline Phosphatase: 44 U/L (ref 38–126)
Anion gap: 8 (ref 5–15)
BUN: 14 mg/dL (ref 8–23)
CO2: 27 mmol/L (ref 22–32)
Calcium: 9 mg/dL (ref 8.9–10.3)
Chloride: 100 mmol/L (ref 98–111)
Creatinine, Ser: 1.2 mg/dL (ref 0.61–1.24)
GFR, Estimated: >60 mL/min (ref >60)
Glucose, Bld: 99 mg/dL (ref 70–99)
Potassium: 4.1 mmol/L (ref 3.5–5.1)
Sodium: 135 mmol/L (ref 135–145)
Total Bilirubin: 0.8 mg/dL (ref 0.3–1.2)
Total Protein: 7.6 g/dL (ref 6.5–8.1)

## 2020-12-21 LAB — URINALYSIS, ROUTINE W REFLEX MICROSCOPIC
Bilirubin Urine: NEGATIVE
Glucose, UA: NEGATIVE mg/dL
Hgb urine dipstick: NEGATIVE
Ketones, ur: NEGATIVE mg/dL
Leukocytes,Ua: NEGATIVE
Nitrite: NEGATIVE
Protein, ur: NEGATIVE mg/dL
Specific Gravity, Urine: 1.02 (ref 1.005–1.030)
pH: 6 (ref 5.0–8.0)

## 2020-12-21 LAB — LIPASE, BLOOD: Lipase: 31 U/L (ref 11–51)

## 2020-12-21 MED ORDER — HYDROCORTISONE (PERIANAL) 2.5 % EX CREA: 1.0000 "application " | TOPICAL_CREAM | Freq: Two times a day (BID) | CUTANEOUS | 0 refills | Status: DC

## 2020-12-21 NOTE — ED Provider Notes (Signed)
[facility] EMERGENCY DEPARTMENT Provider Note   CSN: [id number] Arrival date & time: 12/21/20  1814     History Chief Complaint  Patient presents with   Urinary Retention    Gregory Sweeney is a 60 y.o. male with PMHx HTN, GERD, who presents to the ED today with complaint of gradual onset, constant, since resolved, suprapubic burning with urination that occurred earlier today. Pt reports that earlier today he felt nauseated. He went home to rest and when he woke up he felt like he needed to urinate. He went to the restroom and began having a difficulty urinating. He was able to urinate however afterwards began having an itching sensation to his "GU tract" and "where my prostate sits." He states that the itching lasted longer where his prostate is however has since dissipated. He denies any fevers, chills, pain with defecation, constipation, diarrhea, BRBPR, melena, penile discharge, testicular pain/swelling, or any other associated symptoms. Pt denies being sexually active and is unconcerned regarding STIs.   The history is provided by the patient and medical records.      Past Medical History:  Diagnosis Date   Appendicitis    Bowel obstruction (HCC)    Gastric ulcer    GERD (gastroesophageal reflux disease)    History of kidney stones    Hypertension    not on meds     Patient Active Problem List   Diagnosis Date Noted   PCP NOTES >>>>>>>>>>>> 12/03/2020   Benign prostatic hyperplasia with urinary hesitancy 04/21/2018   History of small bowel obstruction 06/14/2017   Visit for preventive health examination 06/12/2015   Prostate cancer screening 06/12/2015   Essential hypertension 10/21/2014    Past Surgical History:  Procedure Laterality Date   APPENDECTOMY     BALLOON DILATION N/A 05/24/2019   Procedure: BALLOON DILATION;  Surgeon: Jeani Hawking, MD;  Location: [facility] ENDOSCOPY;  Service: Endoscopy;  Laterality: N/A;   BOWEL OBSTRUCTION     CHOLECYSTECTOMY      ESOPHAGEAL DILATION     ESOPHAGOGASTRODUODENOSCOPY (EGD) WITH PROPOFOL N/A 07/21/2013   Procedure: ESOPHAGOGASTRODUODENOSCOPY (EGD) WITH PROPOFOL;  Surgeon: Theda Belfast, MD;  Location: [facility] ENDOSCOPY;  Service: Endoscopy;  Laterality: N/A;   ESOPHAGOGASTRODUODENOSCOPY (EGD) WITH PROPOFOL N/A 05/24/2019   Procedure: ESOPHAGOGASTRODUODENOSCOPY (EGD) WITH PROPOFOL;  Surgeon: Jeani Hawking, MD;  Location: [facility] ENDOSCOPY;  Service: Endoscopy;  Laterality: N/A;   FOREIGN BODY REMOVAL  05/24/2019   Procedure: FOREIGN BODY REMOVAL;  Surgeon: Jeani Hawking, MD;  Location: [facility] ENDOSCOPY;  Service: Endoscopy;;   KIDNEY STONE SURGERY     PARATHYROIDECTOMY N/A 12/30/2015   Procedure: NECK EXPLORATION AND PARATHYROIDECTOMY;  Surgeon: Darnell Level, MD;  Location: [facility] ORS;  Service: General;  Laterality: N/A;       Family History  Problem Relation Age of Onset   Healthy Mother    Healthy Father    Healthy Sister        x2   Healthy Brother        x1   Diabetes Paternal Aunt        Great Aunt   Cancer Neg Hx    Heart disease Neg Hx    Hypercalcemia Neg Hx    Hypertension Neg Hx     Social History   Tobacco Use   Smoking status: Never   Smokeless tobacco: Never  Vaping Use   Vaping Use: Never used  Substance Use Topics   Alcohol use: No    Alcohol/week: 0.0 standard drinks  Drug use: No    Home Medications Prior to Admission medications   Medication Sig Start Date End Date Taking? Authorizing Provider  hydrocortisone (ANUSOL-HC) 2.5 % rectal cream Place 1 application rectally 2 (two) times daily. 12/21/20  Yes Kaiah Hosea, PA-C  amLODipine (NORVASC) 5 MG tablet Take 1 tablet (5 mg total) by mouth daily. 12/03/20   Wanda Plump, MD  Cholecalciferol (VITAMIN D) 50 MCG (2000 UT) CAPS Take by mouth. Patient not taking: No sig reported    [provider]    Allergies    Ampicillin, Banana, Latex, Morphine and related, Other, Penicillins, Rapaflo [silodosin], Shrimp [shellfish  allergy], and Hibiclens [chlorhexidine gluconate]  Review of Systems   Review of Systems  Constitutional:  Negative for chills and fever.  Gastrointestinal:  Positive for abdominal pain and nausea. Negative for blood in stool, constipation, diarrhea and vomiting.  Genitourinary:  Negative for penile discharge and penile pain.  Skin:        + itching  All other systems reviewed and are negative.  Physical Exam Updated Vital Signs BP (!) 151/94   Pulse 70   Temp 97.8 F (36.6 C) (Oral)   Resp 15   Ht 6\' 3"  (1.905 m)   Wt 95.7 kg   SpO2 100%   BMI 26.37 kg/m   Physical Exam Vitals and nursing note reviewed.  Constitutional:      Appearance: He is not ill-appearing or diaphoretic.  HENT:     Head: Normocephalic and atraumatic.  Eyes:     Conjunctiva/sclera: Conjunctivae normal.  Cardiovascular:     Rate and Rhythm: Normal rate and regular rhythm.     Pulses: Normal pulses.  Pulmonary:     Effort: Pulmonary effort is normal.     Breath sounds: Normal breath sounds. No wheezing, rhonchi or rales.  Abdominal:     Tenderness: There is no abdominal tenderness. There is no guarding or rebound.  Genitourinary:    Comments: Chaperone present for exam Uncircumcised penis without phimosis/paraphimosis, hypospadias, erythema, tenderness, or discharge. No rashes or lesions. Testes with no masses or tenderness, no swelling, and cremasterics reflex present bilaterally. No abnormal lie. No inguinal hernias or adenopathy present.  1 small nonthrombosed external hemorrhoid. No prostate enlargement appreciated.  Skin:    General: Skin is warm and dry.     Coloration: Skin is not jaundiced.  Neurological:     Mental Status: He is alert.    ED Results / Procedures / Treatments   Labs (all labs ordered are listed, but only abnormal results are displayed) Labs Reviewed  URINALYSIS, ROUTINE W REFLEX MICROSCOPIC  COMPREHENSIVE METABOLIC PANEL  LIPASE, BLOOD  CBC WITH  DIFFERENTIAL/PLATELET    EKG None  Radiology No results found.  Procedures Procedures   Medications Ordered in ED Medications - No data to display  ED Course  I have reviewed the triage vital signs and the nursing notes.  Pertinent labs & imaging results that were available during my care of the patient were reviewed by me and considered in my medical decision making (see chart for details).    MDM Rules/Calculators/A&P                           60 year old male who presents to the ED today with complaint of episode of difficulty urinating, burning sensation to suprapubic area as well as itching to his prostate that occurred earlier today.  On arrival to the ED today vitals  are stable.  Patient had a urinalysis done prior to being seen which does not show any signs of infection or hemoglobin.  On my exam he has no abdominal tenderness palpation.  Chaperone present for GU exam.  There are no penile lesions.  There is no penile discharge appreciated.  No stricture appreciated. Testicular exam unremarkable.  He does have a small nonthrombosed external hemorrhoid.  His prostate does not feel enlarged at this time and there is no bogginess to this area.  We will plan for lab work at this time for further evaluation as he reports painless itching throughout that lasted several seconds.  Would like to assess his lipase level as well as his LFTs.  If unremarkable will plan to discharge with urology follow-up.  Patient denies being sexually active and is unconcerned about STIs at this time.  I have offered STI testing however he declined.  Labwork unremarkable. CBC without leukocytosis; hgb stable at 15.2. CMP without electrolyte abnormalities. LFTs unremarkable. Lipase wnl at 31.   Will discharge pt home at this time with urology follow up for further eval, question stricture causing some burning with urination? Will prescribe anusol cream for hemorrhoid. Pt is in agreement with plan and stable  for discharge home.   This note was prepared using Dragon voice recognition software and may include unintentional dictation errors due to the inherent limitations of voice recognition software.  Final Clinical Impression(s) / ED Diagnoses Final diagnoses:  Dysuria  Itching  External hemorrhoid    Rx / DC Orders ED Discharge Orders          Ordered    hydrocortisone (ANUSOL-HC) 2.5 % rectal cream  2 times daily        12/21/20 2043             Discharge Instructions      Please follow up with [facility] for further evaluation of the burning sensation you experienced while urinating  Follow up with your PCP regarding ED visit as well  Pick up medication and take as prescribed for your hemorrhoid  Return to the ED for any new/worsening symptoms       Tanda Rockers, Cordelia Poche 12/21/20 2045    Benjiman Core, MD 12/22/20 0010

## 2020-12-21 NOTE — ED Triage Notes (Signed)
Pt states sudden onset burning on urination and urinary retention. Pt denies any discharge. Pt also describes abdominal burning and rectal burning.

## 2020-12-21 NOTE — Discharge Instructions (Addendum)
Please follow up with [facility] for further evaluation of the burning sensation you experienced while urinating  Follow up with your PCP regarding ED visit as well  Pick up medication and take as prescribed for your hemorrhoid  Return to the ED for any new/worsening symptoms

## 2021-01-10 ENCOUNTER — Other Ambulatory Visit (HOSPITAL_BASED_OUTPATIENT_CLINIC_OR_DEPARTMENT_OTHER): Payer: Self-pay

## 2021-01-10 MED ORDER — PFIZER COVID-19 VAC BIVALENT 30 MCG/0.3ML IM SUSP
INTRAMUSCULAR | 0 refills | Status: DC
  Filled 2021-01-10: qty 0.3, 1d supply, fill #0

## 2021-03-10 DIAGNOSIS — R3912 Poor urinary stream: Secondary | ICD-10-CM | POA: Diagnosis not present

## 2021-03-10 DIAGNOSIS — N401 Enlarged prostate with lower urinary tract symptoms: Secondary | ICD-10-CM | POA: Diagnosis not present

## 2021-03-11 LAB — PSA: PSA: 0.5

## 2021-03-12 ENCOUNTER — Encounter: Payer: Self-pay | Admitting: Internal Medicine

## 2021-03-12 DIAGNOSIS — K222 Esophageal obstruction: Secondary | ICD-10-CM | POA: Insufficient documentation

## 2021-03-12 DIAGNOSIS — K219 Gastro-esophageal reflux disease without esophagitis: Secondary | ICD-10-CM | POA: Insufficient documentation

## 2021-03-12 DIAGNOSIS — R1310 Dysphagia, unspecified: Secondary | ICD-10-CM | POA: Insufficient documentation

## 2021-05-20 ENCOUNTER — Encounter: Payer: 59 | Admitting: Internal Medicine

## 2021-05-29 ENCOUNTER — Encounter: Payer: Self-pay | Admitting: Internal Medicine

## 2021-06-25 ENCOUNTER — Encounter: Payer: Self-pay | Admitting: Internal Medicine

## 2021-06-25 ENCOUNTER — Ambulatory Visit (INDEPENDENT_AMBULATORY_CARE_PROVIDER_SITE_OTHER): Payer: 59 | Admitting: Internal Medicine

## 2021-06-25 VITALS — BP 136/88 | HR 86 | Temp 97.6°F | Resp 16 | Ht 75.0 in | Wt 216.0 lb

## 2021-06-25 DIAGNOSIS — Z0000 Encounter for general adult medical examination without abnormal findings: Secondary | ICD-10-CM

## 2021-06-25 DIAGNOSIS — I10 Essential (primary) hypertension: Secondary | ICD-10-CM

## 2021-06-25 DIAGNOSIS — R739 Hyperglycemia, unspecified: Secondary | ICD-10-CM

## 2021-06-25 DIAGNOSIS — Z1159 Encounter for screening for other viral diseases: Secondary | ICD-10-CM

## 2021-06-25 NOTE — Patient Instructions (Addendum)
Check the  blood pressure regularly (weekly) ?BP GOAL is between 110/65 and  135/85. ?If it is consistently higher or lower, let me know ? ?GO TO THE LAB : Get the blood work   ? ? ?GO TO THE FRONT DESK, PLEASE SCHEDULE YOUR APPOINTMENTS ?Come back for a checkup in 6 months ? ?DASH Eating Plan ?DASH stands for Dietary Approaches to Stop Hypertension. The DASH eating plan is a healthy eating plan that has been shown to: ?Reduce high blood pressure (hypertension). ?Reduce your risk for type 2 diabetes, heart disease, and stroke. ?Help with weight loss. ?What are tips for following this plan? ?Reading food labels ?Check food labels for the amount of salt (sodium) per serving. Choose foods with less than 5 percent of the Daily Value of sodium. Generally, foods with less than 300 milligrams (mg) of sodium per serving fit into this eating plan. ?To find whole grains, look for the word "whole" as the first word in the ingredient list. ?Shopping ?Buy products labeled as "low-sodium" or "no salt added." ?Buy fresh foods. Avoid canned foods and pre-made or frozen meals. ?Cooking ?Avoid adding salt when cooking. Use salt-free seasonings or herbs instead of table salt or sea salt. Check with your health care provider or pharmacist before using salt substitutes. ?Do not fry foods. Cook foods using healthy methods such as baking, boiling, grilling, roasting, and broiling instead. ?Cook with heart-healthy oils, such as olive, canola, avocado, soybean, or sunflower oil. ?Meal planning ? ?Eat a balanced diet that includes: ?4 or more servings of fruits and 4 or more servings of vegetables each day. Try to fill one-half of your plate with fruits and vegetables. ?6-8 servings of whole grains each day. ?Less than 6 oz (170 g) of lean meat, poultry, or fish each day. A 3-oz (85-g) serving of meat is about the same size as a deck of cards. One egg equals 1 oz (28 g). ?2-3 servings of low-fat dairy each day. One serving is 1 cup (237  mL). ?1 serving of nuts, seeds, or beans 5 times each week. ?2-3 servings of heart-healthy fats. Healthy fats called omega-3 fatty acids are found in foods such as walnuts, flaxseeds, fortified milks, and eggs. These fats are also found in cold-water fish, such as sardines, salmon, and mackerel. ?Limit how much you eat of: ?Canned or prepackaged foods. ?Food that is high in trans fat, such as some fried foods. ?Food that is high in saturated fat, such as fatty meat. ?Desserts and other sweets, sugary drinks, and other foods with added sugar. ?Full-fat dairy products. ?Do not salt foods before eating. ?Do not eat more than 4 egg yolks a week. ?Try to eat at least 2 vegetarian meals a week. ?Eat more home-cooked food and less restaurant, buffet, and fast food. ?Lifestyle ?When eating at a restaurant, ask that your food be prepared with less salt or no salt, if possible. ?If you drink alcohol: ?Limit how much you use to: ?0-1 drink a day for women who are not pregnant. ?0-2 drinks a day for men. ?Be aware of how much alcohol is in your drink. In the [place] one drink equals one 12 oz bottle of beer (355 mL), one 5 oz glass of wine (148 mL), or one 1? oz glass of hard liquor (44 mL). ?General information ?Avoid eating more than 2,300 mg of salt a day. If you have hypertension, you may need to reduce your sodium intake to 1,500 mg a day. ?Work with your health  care provider to maintain a healthy body weight or to lose weight. Ask what an ideal weight is for you. ?Get at least 30 minutes of exercise that causes your heart to beat faster (aerobic exercise) most days of the week. Activities may include walking, swimming, or biking. ?Work with your health care provider or dietitian to adjust your eating plan to your individual calorie needs. ?What foods should I eat? ?Fruits ?All fresh, dried, or frozen fruit. Canned fruit in natural juice (without added sugar). ?Vegetables ?Fresh or frozen vegetables (raw, steamed, roasted,  or grilled). Low-sodium or reduced-sodium tomato and vegetable juice. Low-sodium or reduced-sodium tomato sauce and tomato paste. Low-sodium or reduced-sodium canned vegetables. ?Grains ?Whole-grain or whole-wheat bread. Whole-grain or whole-wheat pasta. Brown rice. Orpah Cobb. Bulgur. Whole-grain and low-sodium cereals. Pita bread. Low-fat, low-sodium crackers. Whole-wheat flour tortillas. ?Meats and other proteins ?Skinless chicken or [place]. Ground chicken or [place]. Pork with fat trimmed off. Fish and seafood. Egg whites. Dried beans, peas, or lentils. Unsalted nuts, nut butters, and seeds. Unsalted canned beans. Lean cuts of beef with fat trimmed off. Low-sodium, lean precooked or cured meat, such as sausages or meat loaves. ?Dairy ?Low-fat (1%) or fat-free (skim) milk. Reduced-fat, low-fat, or fat-free cheeses. Nonfat, low-sodium ricotta or cottage cheese. Low-fat or nonfat yogurt. Low-fat, low-sodium cheese. ?Fats and oils ?Soft margarine without trans fats. Vegetable oil. Reduced-fat, low-fat, or light mayonnaise and salad dressings (reduced-sodium). Canola, safflower, olive, avocado, soybean, and sunflower oils. Avocado. ?Seasonings and condiments ?Herbs. Spices. Seasoning mixes without salt. ?Other foods ?Unsalted popcorn and pretzels. Fat-free sweets. ?The items listed above may not be a complete list of foods and beverages you can eat. Contact a dietitian for more information. ?What foods should I avoid? ?Fruits ?Canned fruit in a light or heavy syrup. Fried fruit. Fruit in cream or butter sauce. ?Vegetables ?Creamed or fried vegetables. Vegetables in a cheese sauce. Regular canned vegetables (not low-sodium or reduced-sodium). Regular canned tomato sauce and paste (not low-sodium or reduced-sodium). Regular tomato and vegetable juice (not low-sodium or reduced-sodium). Rosita Fire. Olives. ?Grains ?Baked goods made with fat, such as croissants, muffins, or some breads. Dry pasta or rice meal  packs. ?Meats and other proteins ?Fatty cuts of meat. Ribs. Fried meat. Tomasa Blase. Bologna, salami, and other precooked or cured meats, such as sausages or meat loaves. Fat from the back of a pig (fatback). Bratwurst. Salted nuts and seeds. Canned beans with added salt. Canned or smoked fish. Whole eggs or egg yolks. Chicken or [place] with skin. ?Dairy ?Whole or 2% milk, cream, and half-and-half. Whole or full-fat cream cheese. Whole-fat or sweetened yogurt. Full-fat cheese. Nondairy creamers. Whipped toppings. Processed cheese and cheese spreads. ?Fats and oils ?Butter. Stick margarine. Lard. Shortening. Ghee. Bacon fat. Tropical oils, such as coconut, palm kernel, or palm oil. ?Seasonings and condiments ?Onion salt, garlic salt, seasoned salt, table salt, and sea salt. Worcestershire sauce. Tartar sauce. Barbecue sauce. Teriyaki sauce. Soy sauce, including reduced-sodium. Steak sauce. Canned and packaged gravies. Fish sauce. Oyster sauce. Cocktail sauce. Store-bought horseradish. Ketchup. Mustard. Meat flavorings and tenderizers. Bouillon cubes. Hot sauces. Pre-made or packaged marinades. Pre-made or packaged taco seasonings. Relishes. Regular salad dressings. ?Other foods ?Salted popcorn and pretzels. ?The items listed above may not be a complete list of foods and beverages you should avoid. Contact a dietitian for more information. ?Where to find more information ?National Heart, Lung, and Blood Institute: PopSteam.is ?American Heart Association: www.heart.org ?Academy of Nutrition and Dietetics: www.eatright.org ?National Kidney Foundation: www.kidney.org ?Summary ?The DASH  eating plan is a healthy eating plan that has been shown to reduce high blood pressure (hypertension). It may also reduce your risk for type 2 diabetes, heart disease, and stroke. ?When on the DASH eating plan, aim to eat more fresh fruits and vegetables, whole grains, lean proteins, low-fat dairy, and heart-healthy fats. ?With the DASH  eating plan, you should limit salt (sodium) intake to 2,300 mg a day. If you have hypertension, you may need to reduce your sodium intake to 1,500 mg a day. ?Work with your health care provider or dietitian to adjust

## 2021-06-25 NOTE — Progress Notes (Signed)
? ?Subjective:  ? ? Patient ID: Gregory Sweeney, male    DOB: [birth year], 61 y.o.   MRN: [id number] ? ?DOS:  06/25/2021 ?Type of visit - description: cpx ? ?Here for CPX. ?Since the last office visit is doing well. ?No major symptoms. ?Reports he did not take amlodipine, BP today is okay. ? ?Review of Systems ? ?Other than above, a 14 point review of systems is negative  ?  ? ? ?Past Medical History:  ?Diagnosis Date  ? Appendicitis   ? Bowel obstruction (HCC)   ? Gastric ulcer   ? GERD (gastroesophageal reflux disease)   ? History of kidney stones   ? Hypertension   ? not on meds   ? ? ?Past Surgical History:  ?Procedure Laterality Date  ? APPENDECTOMY    ? BALLOON DILATION N/A 05/24/2019  ? Procedure: BALLOON DILATION;  Surgeon: Jeani Hawking, MD;  Location: [facility] ENDOSCOPY;  Service: Endoscopy;  Laterality: N/A;  ? BOWEL OBSTRUCTION    ? CHOLECYSTECTOMY    ? ESOPHAGEAL DILATION    ? ESOPHAGOGASTRODUODENOSCOPY (EGD) WITH PROPOFOL N/A 07/21/2013  ? Procedure: ESOPHAGOGASTRODUODENOSCOPY (EGD) WITH PROPOFOL;  Surgeon: Theda Belfast, MD;  Location: [facility] ENDOSCOPY;  Service: Endoscopy;  Laterality: N/A;  ? ESOPHAGOGASTRODUODENOSCOPY (EGD) WITH PROPOFOL N/A 05/24/2019  ? Procedure: ESOPHAGOGASTRODUODENOSCOPY (EGD) WITH PROPOFOL;  Surgeon: Jeani Hawking, MD;  Location: [facility] ENDOSCOPY;  Service: Endoscopy;  Laterality: N/A;  ? FOREIGN BODY REMOVAL  05/24/2019  ? Procedure: FOREIGN BODY REMOVAL;  Surgeon: Jeani Hawking, MD;  Location: [facility] ENDOSCOPY;  Service: Endoscopy;;  ? KIDNEY STONE SURGERY    ? PARATHYROIDECTOMY N/A 12/30/2015  ? Procedure: NECK EXPLORATION AND PARATHYROIDECTOMY;  Surgeon: Darnell Level, MD;  Location: [facility] ORS;  Service: General;  Laterality: N/A;  ? ?Social History  ? ?Socioeconomic History  ? Marital status: Single  ?  Spouse name: Not on file  ? Number of children: 0  ? Years of education: Not on file  ? Highest education level: Not on file  ?Occupational History  ? Occupation: Mail carrier  ?  Employer: [employer]  ? Occupation: Representative  ?  Comment: [employer]  ?Tobacco Use  ? Smoking status: Never  ? Smokeless tobacco: Never  ?Vaping Use  ? Vaping Use: Never used  ?Substance and Sexual Activity  ? Alcohol use: No  ?  Alcohol/week: 0.0 standard drinks  ? Drug use: No  ? Sexual activity: Not Currently  ?Other Topics Concern  ? Not on file  ?Social History Narrative  ? Lives by himself, has a g-friend   ? ?Social Determinants of Health  ? ?Financial Resource Strain: Not on file  ?Food Insecurity: Not on file  ?Transportation Needs: Not on file  ?Physical Activity: Not on file  ?Stress: Not on file  ?Social Connections: Not on file  ?Intimate Partner Violence: Not on file  ? ? ? ?No current outpatient medications ? ? ?   ?Objective:  ? Physical Exam ?BP 136/88 (BP Location: Left Arm, Patient Position: Sitting, Cuff Size: Normal)   Pulse 86   Temp 97.6 ?F (36.4 ?C) (Oral)   Resp 16   Ht 6\' 3"  (1.905 m)   Wt 216 lb (98 kg)   SpO2 96%   BMI 27.00 kg/m?  ?General: ?Well developed, NAD, BMI noted ?Neck: No  thyromegaly  ?HEENT:  ?Normocephalic . Face symmetric, atraumatic ?Lungs:  ?CTA B ?Normal respiratory effort, no intercostal retractions, no accessory muscle use. ?Heart: RRR,  no murmur.  ?  Abdomen:  ?Not distended, soft, non-tender. No rebound or rigidity.   ?Lower extremities: no pretibial edema bilaterally  ?Skin: Exposed areas without rash. Not pale. Not jaundice ?Neurologic:  ?alert & oriented X3.  ?Speech normal, gait appropriate for age and unassisted ?Strength symmetric and appropriate for age.  ?Psych: ?Cognition and judgment appear intact.  ?Cooperative with normal attention span and concentration.  ?Behavior appropriate. ?No anxious or depressed appearing. ? ?   ?Assessment   ? ?  ASSESSMENT (transferred to this location 08-2020) ?HTN ?BPH ?GI: Dr. Tonette Lederer ?Schatzki ring (sees GI, s/p dilatation) ?History of exploratory laparotomy at age 13 due to gastric ulcer, h/o  SBO ?Vitamin D  deficiency ?BPH, cystoscopy 2019, LUTS.  Sees urology. ? ?PLAN ?Here for CPX ?HTN: See last visit, was Rx amlodipine, states he did not take it, BP today is okay, recommend to check at home. ?RTC 6 months (for BP check) ? ? ?  ?

## 2021-06-26 ENCOUNTER — Encounter: Payer: Self-pay | Admitting: Internal Medicine

## 2021-06-26 LAB — CBC WITH DIFFERENTIAL/PLATELET
Basophils Absolute: 0.1 10*3/uL (ref 0.0–0.1)
Basophils Relative: 0.8 % (ref 0.0–3.0)
Eosinophils Absolute: 0.3 10*3/uL (ref 0.0–0.7)
Eosinophils Relative: 4.7 % (ref 0.0–5.0)
HCT: 45.4 % (ref 39.0–52.0)
Hemoglobin: 14.9 g/dL (ref 13.0–17.0)
Lymphocytes Relative: 27.9 % (ref 12.0–46.0)
Lymphs Abs: 1.8 10*3/uL (ref 0.7–4.0)
MCHC: 32.9 g/dL (ref 30.0–36.0)
MCV: 90.4 fl (ref 78.0–100.0)
Monocytes Absolute: 0.6 10*3/uL (ref 0.1–1.0)
Monocytes Relative: 8.6 % (ref 3.0–12.0)
Neutro Abs: 3.8 10*3/uL (ref 1.4–7.7)
Neutrophils Relative %: 58 % (ref 43.0–77.0)
Platelets: 230 10*3/uL (ref 150.0–400.0)
RBC: 5.02 Mil/uL (ref 4.22–5.81)
RDW: 13.9 % (ref 11.5–15.5)
WBC: 6.6 10*3/uL (ref 4.0–10.5)

## 2021-06-26 LAB — BASIC METABOLIC PANEL
BUN: 15 mg/dL (ref 6–23)
CO2: 27 mEq/L (ref 19–32)
Calcium: 9.1 mg/dL (ref 8.4–10.5)
Chloride: 100 mEq/L (ref 96–112)
Creatinine, Ser: 1.33 mg/dL (ref 0.40–1.50)
GFR: 56.99 mL/min — ABNORMAL LOW (ref >60.00)
Glucose, Bld: 81 mg/dL (ref 70–99)
Potassium: 4.7 mEq/L (ref 3.5–5.1)
Sodium: 136 mEq/L (ref 135–145)

## 2021-06-26 LAB — HEPATITIS C ANTIBODY
Hepatitis C Ab: NONREACTIVE
SIGNAL TO CUT-OFF: 0.09 (ref <1.00)

## 2021-06-26 LAB — HEMOGLOBIN A1C: Hgb A1c MFr Bld: 6.1 % (ref 4.6–6.5)

## 2021-06-26 LAB — TSH: TSH: 2.57 u[IU]/mL (ref 0.35–5.50)

## 2021-06-26 NOTE — Assessment & Plan Note (Signed)
Here for CPX ?HTN: See last visit, was Rx amlodipine, states he did not take it, BP today is okay, recommend to check at home. ?RTC 6 months (for BP check) ?

## 2021-06-26 NOTE — Assessment & Plan Note (Signed)
Tdap 2016 ?Covid vax: utd ?CCS: multiple cscopes , last colonoscopy 08/04/2018, next 10 years per report. ?Prostate cancer screening: Saw urology 03/02/2021, DRE was done.  PSA 03/10/2021 was 0.5 (K PN). ?Labs: BMP, a1c, vCBC, tsh, hep C ?Patient education: Diet and exercise ?

## 2021-12-01 ENCOUNTER — Telehealth: Payer: Self-pay | Admitting: Internal Medicine

## 2021-12-01 ENCOUNTER — Encounter: Payer: Self-pay | Admitting: Internal Medicine

## 2021-12-01 ENCOUNTER — Ambulatory Visit (INDEPENDENT_AMBULATORY_CARE_PROVIDER_SITE_OTHER): Payer: 59 | Admitting: Internal Medicine

## 2021-12-01 VITALS — BP 140/84 | HR 78 | Temp 98.4°F | Resp 18 | Ht 75.0 in | Wt 211.0 lb

## 2021-12-01 DIAGNOSIS — Z23 Encounter for immunization: Secondary | ICD-10-CM

## 2021-12-01 DIAGNOSIS — I10 Essential (primary) hypertension: Secondary | ICD-10-CM | POA: Diagnosis not present

## 2021-12-01 MED ORDER — AMLODIPINE BESYLATE 5 MG PO TABS: 5.0000 mg | ORAL_TABLET | Freq: Every day | ORAL | 1 refills | Status: DC

## 2021-12-01 NOTE — Patient Instructions (Signed)
Start amlodipine 5 mg: 1 tablet at nighttime.  Continue with your healthy lifestyle  Check the  blood pressure regularly BP GOAL is between 110/65 and  135/85. If it is consistently higher or lower, let me know     GO TO THE FRONT DESK, [place] back for a checkup in 4 months

## 2021-12-01 NOTE — Progress Notes (Signed)
Subjective:    Patient ID: Gregory Sweeney, male    DOB: August 27, 1960, 61 y.o.   MRN: [id number]  DOS:  12/01/2021 Type of visit - description: acute   Patient is concerned about his blood pressure.  This morning was 160/100, he rechecked few additional times today,  the last BP was  137/86.  In the last few months, BP has been ranging from the high 140s/80-130/80.  Denies chest pain, difficulty breathing or lower extremity edema  BP Readings from Last 3 Encounters:  12/01/21 (!) 140/84  06/25/21 136/88  12/21/20 (!) 143/85    Review of Systems See above   Past Medical History:  Diagnosis Date   Appendicitis    Bowel obstruction (HCC)    Gastric ulcer    GERD (gastroesophageal reflux disease)    History of kidney stones    Hypertension    not on meds     Past Surgical History:  Procedure Laterality Date   APPENDECTOMY     BALLOON DILATION N/A 05/24/2019   Procedure: BALLOON DILATION;  Surgeon: Jeani Hawking, MD;  Location: [facility] ENDOSCOPY;  Service: Endoscopy;  Laterality: N/A;   BOWEL OBSTRUCTION     CHOLECYSTECTOMY     ESOPHAGEAL DILATION     ESOPHAGOGASTRODUODENOSCOPY (EGD) WITH PROPOFOL N/A 07/21/2013   Procedure: ESOPHAGOGASTRODUODENOSCOPY (EGD) WITH PROPOFOL;  Surgeon: Theda Belfast, MD;  Location: [facility] ENDOSCOPY;  Service: Endoscopy;  Laterality: N/A;   ESOPHAGOGASTRODUODENOSCOPY (EGD) WITH PROPOFOL N/A 05/24/2019   Procedure: ESOPHAGOGASTRODUODENOSCOPY (EGD) WITH PROPOFOL;  Surgeon: Jeani Hawking, MD;  Location: [facility] ENDOSCOPY;  Service: Endoscopy;  Laterality: N/A;   FOREIGN BODY REMOVAL  05/24/2019   Procedure: FOREIGN BODY REMOVAL;  Surgeon: Jeani Hawking, MD;  Location: [facility] ENDOSCOPY;  Service: Endoscopy;;   KIDNEY STONE SURGERY     PARATHYROIDECTOMY N/A 12/30/2015   Procedure: NECK EXPLORATION AND PARATHYROIDECTOMY;  Surgeon: Darnell Level, MD;  Location: [facility] ORS;  Service: General;  Laterality: N/A;    Current Outpatient Medications  Medication Instructions    amLODipine (NORVASC) 5 mg, Oral, Daily       Objective:   Physical Exam BP (!) 140/84   Pulse 78   Temp 98.4 F (36.9 C) (Oral)   Resp 18   Ht 6\' 3"  (1.905 m)   Wt 211 lb (95.7 kg)   SpO2 99%   BMI 26.37 kg/m  General:   Well developed, NAD, BMI noted. HEENT:  Normocephalic . Face symmetric, atraumatic Lungs:  CTA B Normal respiratory effort, no intercostal retractions, no accessory muscle use. Heart: RRR,  no murmur.  Lower extremities: no pretibial edema bilaterally  Skin: Not pale. Not jaundice Neurologic:  alert & oriented X3.  Speech normal, gait appropriate for age and unassisted Psych--  Cognition and judgment appear intact.  Cooperative with normal attention span and concentration.  Behavior appropriate. No anxious or depressed appearing.      Assessment    ASSESSMENT (transferred to this location 08-2020) HTN BPH GI: Dr. 12-02-1970 Schatzki ring (sees GI, s/p dilatation) History of exploratory laparotomy at age 83 due to gastric ulcer, h/o  SBO Vitamin D deficiency BPH, cystoscopy 2019, LUTS.  Sees urology.  PLAN HTN: Mild, patient already follows a healthy diet and low in salt.  We agree on continue with lifestyle & start amlodipine 5 mg nightly, Rx sent, side effect includes edema, watch for it.  Continue checking BPs, re assess  4 months. Dyslipidemia: Mild, statins are indicated based on 10 year CV RF, he is  not opposed, we agreed to check a FLP on RTC and consider statins. Preventive care: Flu shot today, COVID booster recommended. RTC 4 months

## 2021-12-01 NOTE — Assessment & Plan Note (Signed)
HTN: Mild, patient already follows a healthy diet and low in salt.  We agree on continue with lifestyle & start amlodipine 5 mg nightly, Rx sent, side effect includes edema, watch for it.  Continue checking BPs, re assess  4 months. Dyslipidemia: Mild, statins are indicated based on 10 year CV RF, he is not opposed, we agreed to check a FLP on RTC and consider statins. Preventive care: Flu shot today, COVID booster recommended. RTC 4 months

## 2021-12-01 NOTE — Telephone Encounter (Signed)
Rx resent.

## 2021-12-01 NOTE — Telephone Encounter (Signed)
Pt called stating that his amlodipine needed to be sent to the following pharmacy:  [facility] [address] [place], [place] [address] P: [phone]

## 2021-12-26 ENCOUNTER — Ambulatory Visit: Payer: 59 | Admitting: Internal Medicine

## 2022-02-09 ENCOUNTER — Telehealth: Payer: Self-pay | Admitting: Medical

## 2022-02-09 ENCOUNTER — Ambulatory Visit: Payer: 59 | Admitting: Medical

## 2022-02-09 ENCOUNTER — Encounter: Payer: Self-pay | Admitting: Medical

## 2022-02-09 VITALS — BP 143/60 | HR 82 | Temp 98.2°F | Resp 18 | Ht 75.0 in | Wt 209.8 lb

## 2022-02-09 DIAGNOSIS — R110 Nausea: Secondary | ICD-10-CM

## 2022-02-09 DIAGNOSIS — R3911 Hesitancy of micturition: Secondary | ICD-10-CM

## 2022-02-09 DIAGNOSIS — R102 Pelvic and perineal pain: Secondary | ICD-10-CM

## 2022-02-09 LAB — CBC WITH DIFFERENTIAL/PLATELET
Basophils Absolute: 0.1 10*3/uL (ref 0.0–0.1)
Basophils Relative: 0.9 % (ref 0.0–3.0)
Eosinophils Absolute: 0.2 10*3/uL (ref 0.0–0.7)
Eosinophils Relative: 2.6 % (ref 0.0–5.0)
HCT: 39.7 % (ref 39.0–52.0)
Hemoglobin: 13.6 g/dL (ref 13.0–17.0)
Lymphocytes Relative: 17.7 % (ref 12.0–46.0)
Lymphs Abs: 1.5 10*3/uL (ref 0.7–4.0)
MCHC: 34.3 g/dL (ref 30.0–36.0)
MCV: 89.5 fl (ref 78.0–100.0)
Monocytes Absolute: 1.2 10*3/uL — ABNORMAL HIGH (ref 0.1–1.0)
Monocytes Relative: 14.1 % — ABNORMAL HIGH (ref 3.0–12.0)
Neutro Abs: 5.6 10*3/uL (ref 1.4–7.7)
Neutrophils Relative %: 64.7 % (ref 43.0–77.0)
Platelets: 265 10*3/uL (ref 150.0–400.0)
RBC: 4.44 Mil/uL (ref 4.22–5.81)
RDW: 13.7 % (ref 11.5–15.5)
WBC: 8.6 10*3/uL (ref 4.0–10.5)

## 2022-02-09 LAB — COMPREHENSIVE METABOLIC PANEL
ALT: 19 U/L (ref 0–53)
AST: 17 U/L (ref 0–37)
Albumin: 3.9 g/dL (ref 3.5–5.2)
Alkaline Phosphatase: 47 U/L (ref 39–117)
BUN: 17 mg/dL (ref 6–23)
CO2: 29 mEq/L (ref 19–32)
Calcium: 9 mg/dL (ref 8.4–10.5)
Chloride: 102 mEq/L (ref 96–112)
Creatinine, Ser: 1.25 mg/dL (ref 0.40–1.50)
GFR: 61.13 mL/min (ref >60.00)
Glucose, Bld: 93 mg/dL (ref 70–99)
Potassium: 4.4 mEq/L (ref 3.5–5.1)
Sodium: 140 mEq/L (ref 135–145)
Total Bilirubin: 0.3 mg/dL (ref 0.2–1.2)
Total Protein: 7.1 g/dL (ref 6.0–8.3)

## 2022-02-09 LAB — PSA: PSA: 0.7 ng/mL (ref 0.10–4.00)

## 2022-02-09 LAB — LIPASE: Lipase: 38 U/L (ref 11.0–59.0)

## 2022-02-09 NOTE — Patient Instructions (Addendum)
Very brief suprapubic pain, nausea, vomiting and urinary hesitancy. Will evaluate if bph, vs infection or prostatitis. If you symptoms return, change or worsen please my chart  For nausea will add cbc, cmp and lipase.  Potentially give treatment after lab review. Since no signs/symptoms holding off presently.  With you abd surgery history let me know if getting any constipation.  Htn- bp is mild/borderline high. Continue amlodipine.  Follow up 2 weeks or sooner if needed.

## 2022-02-09 NOTE — Progress Notes (Signed)
Subjective:    Patient ID: Gregory Sweeney, male    DOB: 06/01/1960, 61 y.o.   MRN: [id number]  HPI   Pt comes in with various signs and symptoms.  Lower suprapubic pain/cramping 3 days ago(very brief just for one mintue.). Then he noted sharp pain in left foot. Then shortly after that he had low back pain. He went to bathroom. He urinated and stated his urine flow became a dribble. He clarifies no urinary incontinence.   Presently he has no symptoms.    Pt in for recent abdomen cramping,  dyspnea low back pain, some tingling to his left foot.  On review below establish care notes from Dr. Drue Novel   Assessment HTN BPH GI: Dr. Tonette Lederer Schatzki ring (sees GI, s/p dilatation) History of exploratory laparotomy at age 46 due to gastric ulcer, h/o  SBO Vitamin D deficiency Urolithiasis   PLAN Lower abdominal and lower back discomfort: As described above, symptoms are somewhat atypical for any specific syndrome, had a colonoscopy 07/2018 showing diverticuli but no polyps. Plan: CBC to rule out anemia, CMP, TSH.  Try Probiotics. Shortness of breath? As described above, for 6 months, EKG today: NSR. No FH CAD, non-smoker, although he gets short of breath he is a still able to work on his 2 acres of land.  Rec observation for now, reassess on RTC. LUTS: Urinary stream is not as strong as before, history of BPH, DRE normal.  Check PSA and UA Vitamin D deficiency: Recommend 2000 units daily Preventive care: Covid x 3 , rec booster  RTC 3 months    Review of Systems  Constitutional:  Negative for chills and fatigue.  HENT:  Negative for congestion and ear discharge.   Respiratory:  Negative for chest tightness, shortness of breath and wheezing.   Cardiovascular:  Negative for chest pain and palpitations.  Gastrointestinal:  Positive for nausea and vomiting. Negative for abdominal distention, abdominal pain, blood in stool, constipation and diarrhea.       On brief nausea when had  brief cva area pain. Note no lumbar pain.  States only vomited one time mucus.   Genitourinary:  Negative for enuresis, hematuria and urgency.       Dribbling urine flow but just one day and brief.  Musculoskeletal:  Positive for back pain.       But was in bilatera cva area.  Skin:  Negative for rash.  Neurological:  Negative for facial asymmetry, speech difficulty and light-headedness.  Hematological:  Negative for adenopathy. Does not bruise/bleed easily.  Psychiatric/Behavioral:  Negative for sleep disturbance. The patient is not nervous/anxious.     Past Medical History:  Diagnosis Date   Appendicitis    Bowel obstruction (HCC)    Gastric ulcer    GERD (gastroesophageal reflux disease)    History of kidney stones    Hypertension    not on meds      Social History   Socioeconomic History   Marital status: Single    Spouse name: Not on file   Number of children: 0   Years of education: Not on file   Highest education level: Not on file  Occupational History   Occupation: Mail carrier    Employer: [employer]   Occupation: Technical brewer    Comment: Chiropractor  Tobacco Use   Smoking status: Never   Smokeless tobacco: Never  Vaping Use   Vaping Use: Never used  Substance and Sexual Activity   Alcohol use: No  Alcohol/week: 0.0 standard drinks of alcohol   Drug use: No   Sexual activity: Not Currently  Other Topics Concern   Not on file  Social History Narrative   Lives by himself, has a g-friend    Social Determinants of Health   Financial Resource Strain: Not on file  Food Insecurity: Not on file  Transportation Needs: Not on file  Physical Activity: Not on file  Stress: Not on file  Social Connections: Not on file  Intimate Partner Violence: Not on file    Past Surgical History:  Procedure Laterality Date   APPENDECTOMY     BALLOON DILATION N/A 05/24/2019   Procedure: [place];  Surgeon: Jeani Hawking, MD;  Location: [facility]  ENDOSCOPY;  Service: Endoscopy;  Laterality: N/A;   BOWEL OBSTRUCTION     CHOLECYSTECTOMY     ESOPHAGEAL DILATION     ESOPHAGOGASTRODUODENOSCOPY (EGD) WITH PROPOFOL N/A 07/21/2013   Procedure: ESOPHAGOGASTRODUODENOSCOPY (EGD) WITH PROPOFOL;  Surgeon: Theda Belfast, MD;  Location: [facility] ENDOSCOPY;  Service: Endoscopy;  Laterality: N/A;   ESOPHAGOGASTRODUODENOSCOPY (EGD) WITH PROPOFOL N/A 05/24/2019   Procedure: ESOPHAGOGASTRODUODENOSCOPY (EGD) WITH PROPOFOL;  Surgeon: Jeani Hawking, MD;  Location: [facility] ENDOSCOPY;  Service: Endoscopy;  Laterality: N/A;   FOREIGN BODY REMOVAL  05/24/2019   Procedure: FOREIGN BODY REMOVAL;  Surgeon: Jeani Hawking, MD;  Location: [facility] ENDOSCOPY;  Service: Endoscopy;;   KIDNEY STONE SURGERY     PARATHYROIDECTOMY N/A 12/30/2015   Procedure: NECK EXPLORATION AND PARATHYROIDECTOMY;  Surgeon: Darnell Level, MD;  Location: [facility] ORS;  Service: General;  Laterality: N/A;    Family History  Problem Relation Age of Onset   Healthy Mother    Healthy Father    Healthy Sister        x2   Healthy Brother        x1   Diabetes Paternal Aunt        Great Aunt   Cancer Neg Hx    Heart disease Neg Hx    Hypercalcemia Neg Hx    Hypertension Neg Hx     Allergies  Allergen Reactions   Ampicillin Anaphylaxis    DID THE REACTION INVOLVE: Swelling of the face/tongue/throat, SOB, or low BP? Yes Sudden or severe rash/hives, skin peeling, or the inside of the mouth or nose? Yes Did it require medical treatment? Yes When did it last happen?     less than 10 yrs If all above answers are "NO", may proceed with cephalosporin use.    Banana Anaphylaxis   Latex Hives, Shortness Of Breath, Itching and Rash   Morphine And Related Hives, Shortness Of Breath, Itching and Nausea And Vomiting    headache   Other Anaphylaxis    Just pecans   Penicillins Hives and Shortness Of Breath    Has patient had a PCN reaction causing immediate rash, facial/tongue/throat swelling, SOB or lightheadedness  with hypotension: Yes Has patient had a PCN reaction causing severe rash involving mucus membranes or skin necrosis: No Has patient had a PCN reaction that required hospitalization No Has patient had a PCN reaction occurring within the last 10 years: No If all of the above answers are "NO", then may proceed with Cephalosporin use.    Rapaflo [Silodosin]     SJS   Shrimp [Shellfish Allergy] Anaphylaxis    Just shrimp   Hibiclens [Chlorhexidine Gluconate] Itching    Stinging, burning     Current Outpatient Medications on File Prior to Visit  Medication Sig Dispense Refill   amLODipine (  NORVASC) 5 MG tablet Take 1 tablet (5 mg total) by mouth daily. 90 tablet 1   No current facility-administered medications on file prior to visit.    BP (!) 150/80   Pulse 82   Temp 98.2 F (36.8 C)   Resp 18   Ht 6\' 3"  (1.905 m)   Wt 209 lb 12.8 oz (95.2 kg)   SpO2 99%   BMI 26.22 kg/m        Objective:   Physical Exam  General Mental Status- Alert. General Appearance- Not in acute distress.   Skin General: Color- Normal Color. Moisture- Normal Moisture.  Neck Carotid Arteries- Normal color. Moisture- Normal Moisture. No carotid bruits. No JVD.  Chest and Lung Exam Auscultation: Breath Sounds:-Normal.  Cardiovascular Auscultation:Rythm- Regular. Murmurs & Other Heart Sounds:Auscultation of the heart reveals- No Murmurs.  Abdomen Inspection:-Inspeection Normal. Palpation/Percussion:Note:No mass. Palpation and Percussion of the abdomen reveal- Non Tender, Non Distended + BS, no rebound or guarding. Midline large abd scar.  Neurologic Cranial Nerve exam:- CN III-XII intact(No nystagmus), symmetric smile. Strength:- 5/5 equal and symmetric strength both upper and lower extremities.   Back- no mid lumbar or cva area tenderness. No pain on stright leg lift. No pain on rom of rt hip.   Lower ext- l5 s1 sensation intact.     Assessment & Plan:   Patient Instructions  Very  brief suprapubic pain, nausea, vomiting and urinary hesitancy. Will evaluate if bph, vs infection or prostatitis. If you symptoms return, change or worsen please my chart  For nausea will add cbc, cmp and lipase.  Potentially give treatment after lab review. Since no signs/symptoms holding off presently.  With you abd surgery history let me know if getting any constipation.  Htn- bp is mild/borderline high. Continue amlodipine.  Follow up 2 weeks or sooner if needed.      , PA-C

## 2022-02-09 NOTE — Telephone Encounter (Signed)
Pt urine poct was never resulted. I had to take out order last night to close chart. Will you get lab to put in order and result the urine please.

## 2022-02-10 LAB — URINE CULTURE
MICRO NUMBER:: 14328133
Result:: NO GROWTH
SPECIMEN QUALITY:: ADEQUATE

## 2022-02-10 NOTE — Telephone Encounter (Signed)
Urine tossed at end of day , urine culture sent off

## 2022-02-25 ENCOUNTER — Encounter: Payer: Self-pay | Admitting: Internal Medicine

## 2022-02-25 ENCOUNTER — Ambulatory Visit: Payer: 59 | Admitting: Internal Medicine

## 2022-02-25 VITALS — BP 138/78 | HR 79 | Temp 97.8°F | Resp 16 | Ht 75.0 in | Wt 213.0 lb

## 2022-02-25 DIAGNOSIS — Z91018 Allergy to other foods: Secondary | ICD-10-CM | POA: Diagnosis not present

## 2022-02-25 DIAGNOSIS — Z880 Allergy status to penicillin: Secondary | ICD-10-CM | POA: Diagnosis not present

## 2022-02-25 DIAGNOSIS — Z91013 Allergy to seafood: Secondary | ICD-10-CM | POA: Diagnosis not present

## 2022-02-25 DIAGNOSIS — Z885 Allergy status to narcotic agent status: Secondary | ICD-10-CM | POA: Diagnosis not present

## 2022-02-25 DIAGNOSIS — R102 Pelvic and perineal pain: Secondary | ICD-10-CM | POA: Diagnosis not present

## 2022-02-25 DIAGNOSIS — Z87892 Personal history of anaphylaxis: Secondary | ICD-10-CM | POA: Diagnosis not present

## 2022-02-25 DIAGNOSIS — I10 Essential (primary) hypertension: Secondary | ICD-10-CM | POA: Diagnosis not present

## 2022-02-25 DIAGNOSIS — Z91040 Latex allergy status: Secondary | ICD-10-CM | POA: Diagnosis not present

## 2022-02-25 NOTE — Progress Notes (Signed)
Subjective:    Patient ID: Gregory Sweeney, male    DOB: [birth year], 62 y.o.   MRN: [id number]  DOS:  02/25/2022 Type of visit - description: f/u  Symptoms onset 02/06/2022: Had a brief episode of suprapubic pain . Did not have fever or chills. No change in the color of the urine. No dysuria. No nausea or vomiting  Seen at this office December 18, workup: CBC CMP lipase PSA and urine culture: All okay.  No further symptoms since then.   BP Readings from Last 3 Encounters:  02/25/22 138/78  02/09/22 (!) 143/60  12/01/21 (!) 140/84      Review of Systems See above   Past Medical History:  Diagnosis Date   Appendicitis    Bowel obstruction (HCC)    Gastric ulcer    GERD (gastroesophageal reflux disease)    History of kidney stones    Hypertension    not on meds     Past Surgical History:  Procedure Laterality Date   APPENDECTOMY     BALLOON DILATION N/A 05/24/2019   Procedure: BALLOON DILATION;  Surgeon: Carol Ada, MD;  Location: [place];  Service: Endoscopy;  Laterality: N/A;   BOWEL OBSTRUCTION     CHOLECYSTECTOMY     ESOPHAGEAL DILATION     ESOPHAGOGASTRODUODENOSCOPY (EGD) WITH PROPOFOL N/A 07/21/2013   Procedure: ESOPHAGOGASTRODUODENOSCOPY (EGD) WITH PROPOFOL;  Surgeon: Beryle Beams, MD;  Location: [facility] ENDOSCOPY;  Service: Endoscopy;  Laterality: N/A;   ESOPHAGOGASTRODUODENOSCOPY (EGD) WITH PROPOFOL N/A 05/24/2019   Procedure: ESOPHAGOGASTRODUODENOSCOPY (EGD) WITH PROPOFOL;  Surgeon: Carol Ada, MD;  Location: [place];  Service: Endoscopy;  Laterality: N/A;   FOREIGN BODY REMOVAL  05/24/2019   Procedure: FOREIGN BODY REMOVAL;  Surgeon: Carol Ada, MD;  Location: [facility];  Service: Endoscopy;;   KIDNEY STONE SURGERY     PARATHYROIDECTOMY N/A 12/30/2015   Procedure: NECK EXPLORATION AND PARATHYROIDECTOMY;  Surgeon: Armandina Gemma, MD;  Location: [facility] ORS;  Service: General;  Laterality: N/A;    Current Outpatient Medications  Medication  Instructions   amLODipine (NORVASC) 5 mg, Oral, Daily       Objective:   Physical Exam BP 138/78   Pulse 79   Temp 97.8 F (36.6 C) (Oral)   Resp 16   Ht 6\' 3"  (1.905 m)   Wt 213 lb (96.6 kg)   SpO2 97%   BMI 26.62 kg/m  General:   Well developed, NAD, BMI noted.  HEENT:  Normocephalic . Face symmetric, atraumatic Abdomen:  Not distended, soft, non-tender. No rebound or rigidity.   Skin: Not pale. Not jaundice Lower extremities: no pretibial edema bilaterally  Neurologic:  alert & oriented X3.  Speech normal, gait appropriate for age and unassisted Psych--  Cognition and judgment appear intact.  Cooperative with normal attention span and concentration.  Behavior appropriate. No anxious or depressed appearing.     Assessment    ASSESSMENT (transferred to this location 08-2020) HTN BPH GI: Dr. Burnadette Pop Schatzki ring (sees GI, s/p dilatation) History of exploratory laparotomy at age 62 due to gastric ulcer, h/o  SBO Vitamin D deficiency BPH, cystoscopy 2019, LUTS.  Sees urology.  PLAN Suprapubic pain: Single episode, lasting 1 minute, workup negative. No further sxs  We agreed on observation HTN: Has not taking amlodipine but once or twice a month, no side effects, he simply does not  take it.  Ambulatory BPs in the 120s.  Plan: Continue monitoring BPs, discontinue amlodipine. Dyslipidemia: Has a follow-up next month, recommend  to come back fasting, will check FLP. RTC scheduled for next month

## 2022-02-25 NOTE — Patient Instructions (Addendum)
See you next month!

## 2022-02-25 NOTE — Assessment & Plan Note (Signed)
Suprapubic pain: Single episode, lasting 1 minute, workup negative. No further sxs  We agreed on observation HTN: Has not taking amlodipine but once or twice a month, no side effects, he simply does not  take it.  Ambulatory BPs in the 120s.  Plan: Continue monitoring BPs, discontinue amlodipine. Dyslipidemia: Has a follow-up next month, recommend to come back fasting, will check FLP. RTC scheduled for next month

## 2022-04-02 ENCOUNTER — Ambulatory Visit: Payer: 59 | Admitting: Internal Medicine

## 2022-04-02 ENCOUNTER — Encounter: Payer: Self-pay | Admitting: Internal Medicine

## 2022-04-02 VITALS — BP 138/86 | HR 91 | Temp 97.8°F | Resp 16 | Ht 75.0 in | Wt 213.1 lb

## 2022-04-02 DIAGNOSIS — E785 Hyperlipidemia, unspecified: Secondary | ICD-10-CM

## 2022-04-02 DIAGNOSIS — R739 Hyperglycemia, unspecified: Secondary | ICD-10-CM | POA: Diagnosis not present

## 2022-04-02 LAB — LIPID PANEL
Cholesterol: 167 mg/dL (ref 0–200)
HDL: 42.9 mg/dL (ref >39.00)
LDL Cholesterol: 110 mg/dL — ABNORMAL HIGH (ref 0–99)
NonHDL: 123.88
Total CHOL/HDL Ratio: 4
Triglycerides: 71 mg/dL (ref 0.0–149.0)
VLDL: 14.2 mg/dL (ref 0.0–40.0)

## 2022-04-02 LAB — HEMOGLOBIN A1C: Hgb A1c MFr Bld: 5.9 % (ref 4.6–6.5)

## 2022-04-02 NOTE — Assessment & Plan Note (Signed)
HTN: Lifestyle controlled, BPs in the 130s at home.  We had a long conversation about low-salt diet Hyperglycemia: We had a long conversation about low carbohydrate diet and the benefits of exercise.  I explained in detail what the A1c means.  Labs. Dyslipidemia: Mild, current cardiovascular risk is 10.8%.  Borderline indication for statins.  Rechecking FLP today. RTC May 2024 for CPX

## 2022-04-02 NOTE — Patient Instructions (Addendum)
Check the  blood pressure regularly BP GOAL is between 110/65 and  135/85. If it is consistently higher or lower, let me know      GO TO THE LAB : Get the blood work     Gregory Sweeney, Gregory Sweeney Come back for   a physical exam by May 2024, fasting

## 2022-04-02 NOTE — Progress Notes (Signed)
   Subjective:    Patient ID: Gregory Sweeney, male    DOB: January 17, 1961, 62 y.o.   MRN: [id number]  DOS:  04/02/2022 Type of visit - description: f/u  Follow-up for evaluation of hyperglycemia, mild dyslipidemia.   Review of Systems See above   Past Medical History:  Diagnosis Date   Appendicitis    Bowel obstruction (HCC)    Gastric ulcer    GERD (gastroesophageal reflux disease)    History of kidney stones    Hypertension    not on meds     Past Surgical History:  Procedure Laterality Date   APPENDECTOMY     BALLOON DILATION N/A 05/24/2019   Procedure: BALLOON DILATION;  Surgeon: Carol Ada, MD;  Location: [place];  Service: Endoscopy;  Laterality: N/A;   BOWEL OBSTRUCTION     CHOLECYSTECTOMY     ESOPHAGEAL DILATION     ESOPHAGOGASTRODUODENOSCOPY (EGD) WITH PROPOFOL N/A 07/21/2013   Procedure: ESOPHAGOGASTRODUODENOSCOPY (EGD) WITH PROPOFOL;  Surgeon: Beryle Beams, MD;  Location[facility] ENDOSCOPY;  Service: Endoscopy;  Laterality: N/A;   ESOPHAGOGASTRODUODENOSCOPY (EGD) WITH PROPOFOL N/A 05/24/2019   Procedure: ESOPHAGOGASTRODUODENOSCOPY (EGD) WITH PROPOFOL;  Surgeon: Carol Ada, MD;  Location: [place];  Service: Endoscopy;  Laterality: N/A;   FOREIGN BODY REMOVAL  05/24/2019   Procedure: FOREIGN BODY REMOVAL;  Surgeon: Carol Ada, MD;  Location: [place];  Service: Endoscopy;;   KIDNEY STONE SURGERY     PARATHYROIDECTOMY N/A 12/30/2015   Procedure: NECK EXPLORATION AND PARATHYROIDECTOMY;  Surgeon: Armandina Gemma, MD;  Location: [facility] ORS;  Service: General;  Laterality: N/A;    No current outpatient medications     Objective:   Physical Exam BP 138/86   Pulse 91   Temp 97.8 F (36.6 C) (Oral)   Resp 16   Ht 6\' 3"  (1.905 m)   Wt 213 lb 2 oz (96.7 kg)   SpO2 98%   BMI 26.64 kg/m  General:   Well developed, NAD, BMI noted. HEENT:  Normocephalic . Face symmetric, atraumatic Skin: Not pale. Not jaundice Neurologic:  alert & oriented X3.  Speech  normal, gait appropriate for age and unassisted Psych--  Cognition and judgment appear intact.  Cooperative with normal attention span and concentration.  Behavior appropriate. No anxious or depressed appearing.      Assessment     ASSESSMENT (transferred to this location 08-2020) HTN BPH GI: Dr. Burnadette Pop Schatzki ring (sees GI, s/p dilatation) History of exploratory laparotomy at age 57 due to gastric ulcer, h/o  SBO Vitamin D deficiency BPH, cystoscopy 2019, LUTS.  Sees urology.  PLAN HTN: Lifestyle controlled, BPs in the 130s at home.  We had a long conversation about low-salt diet Hyperglycemia: We had a long conversation about low carbohydrate diet and the benefits of exercise.  I explained in detail what the A1c means.  Labs. Dyslipidemia: Mild, current cardiovascular risk is 10.8%.  Borderline indication for statins.  Rechecking FLP today. RTC May 2024 for CPX

## 2022-07-13 ENCOUNTER — Ambulatory Visit (INDEPENDENT_AMBULATORY_CARE_PROVIDER_SITE_OTHER): Payer: 59 | Admitting: Internal Medicine

## 2022-07-13 ENCOUNTER — Encounter: Payer: Self-pay | Admitting: Internal Medicine

## 2022-07-13 VITALS — BP 144/86 | HR 83 | Temp 98.1°F | Resp 16 | Ht 75.0 in | Wt 216.1 lb

## 2022-07-13 DIAGNOSIS — E785 Hyperlipidemia, unspecified: Secondary | ICD-10-CM | POA: Diagnosis not present

## 2022-07-13 DIAGNOSIS — Z0000 Encounter for general adult medical examination without abnormal findings: Secondary | ICD-10-CM | POA: Diagnosis not present

## 2022-07-13 DIAGNOSIS — I10 Essential (primary) hypertension: Secondary | ICD-10-CM

## 2022-07-13 LAB — BASIC METABOLIC PANEL
BUN: 14 mg/dL (ref 6–23)
CO2: 30 mEq/L (ref 19–32)
Calcium: 9.2 mg/dL (ref 8.4–10.5)
Chloride: 103 mEq/L (ref 96–112)
Creatinine, Ser: 1.29 mg/dL (ref 0.40–1.50)
GFR: 58.69 mL/min — ABNORMAL LOW (ref >60.00)
Glucose, Bld: 112 mg/dL — ABNORMAL HIGH (ref 70–99)
Potassium: 4.5 mEq/L (ref 3.5–5.1)
Sodium: 141 mEq/L (ref 135–145)

## 2022-07-13 LAB — LIPID PANEL
Cholesterol: 174 mg/dL (ref 0–200)
HDL: 43.4 mg/dL (ref >39.00)
LDL Cholesterol: 101 mg/dL — ABNORMAL HIGH (ref 0–99)
NonHDL: 130.95
Total CHOL/HDL Ratio: 4
Triglycerides: 151 mg/dL — ABNORMAL HIGH (ref 0.0–149.0)
VLDL: 30.2 mg/dL (ref 0.0–40.0)

## 2022-07-13 MED ORDER — ASPIRIN 81 MG PO TBEC: 81.0000 mg | DELAYED_RELEASE_TABLET | Freq: Every day | ORAL | Status: DC

## 2022-07-13 NOTE — Assessment & Plan Note (Signed)
Tdap 2016 Vaccines I recommend: COVID, Shingrix, RSV.  Flu shot every fall. CCS: multiple cscopes , last colonoscopy 08/04/2018, next 10 years per report. Prostate cancer screening: No FH, currently with no symptoms, PSA was 0.29 January 2022. Labs:BMP FLP Patient education: Diet and exercise discussed, encourage to decrease carbohydrate intake and walk 3 hours a week. Healthcare power of attorney - see AVS

## 2022-07-13 NOTE — Patient Instructions (Addendum)
Start aspirin 81 mg, 1 tablet a day with food.  Vaccines I recommend: Covid booster if not done by 10-2021 Shingrix (shingles) RSV vaccine Flu shot every fall  Watch your salt or sugar intake   Check the  blood pressure regularly BP GOAL is between 110/65 and  135/85. If it is consistently higher or lower, let me know     GO TO THE LAB : Get the blood work     GO TO THE FRONT DESK, PLEASE SCHEDULE YOUR APPOINTMENTS Come back for checkup in 6 months    "Health Care Power of attorney" ,  "Living will" (Advance care planning documents)  If you already have a living will or healthcare power of attorney, is recommended you bring the copy to be scanned in your chart.   The document will be available to all the doctors you see in the system.  Advance care planning is a process that supports adults in  understanding and sharing their preferences regarding future medical care.  The patient's preferences are recorded in documents called Advance Directives and the can be modified at any time while the patient is in full mental capacity.   If you don't have one, please consider create one.      More information at: [email or web address]

## 2022-07-13 NOTE — Assessment & Plan Note (Signed)
Here for CPX, see separate documentation. Hyperglycemia: Last A1c 5.9, we talk about the need to decrease carbohydrate intakes including avoid regular sodas. HTN: Currently on no meds.  BP slightly elevated upon arrival, recheck 144/86, at home in the 130s.  He is already following a low-salt diet.  No change for now Cardiovascular risk: Calculated at 11.6%. - Aspirin benefits discussed, h/o remote  PUD, we agreed to start 81 mg daily with strict GI precautions - Statins are recommended, patient will be very reluctant to take them.  Checking FLP today.  Reassess in 6 months RTC 6 months

## 2022-07-13 NOTE — Progress Notes (Signed)
Subjective:    Patient ID: Gregory Sweeney, male    DOB: 08/08/1960, 62 y.o.   MRN: [id number]  DOS:  07/13/2022 Type of visit - description: CPX  Here for CPX. In general feels well. BP was slightly elevated upon arrival. Today he found about a major problem at his work and is upset about it. He thinks elevated BP today is due to stress today, ambulatory BPs in the 130s usually.  Denies chest pain or difficulty breathing.  Review of Systems  Other than above, a 14 point review of systems is negative    Past Medical History:  Diagnosis Date   Appendicitis    Bowel obstruction (HCC)    Gastric ulcer    GERD (gastroesophageal reflux disease)    History of kidney stones    Hypertension    not on meds     Past Surgical History:  Procedure Laterality Date   APPENDECTOMY     BALLOON DILATION N/A 05/24/2019   Procedure: BALLOON DILATION;  Surgeon: Jeani Hawking, MD;  Location: [facility] ENDOSCOPY;  Service: Endoscopy;  Laterality: N/A;   BOWEL OBSTRUCTION     CHOLECYSTECTOMY     ESOPHAGEAL DILATION     ESOPHAGOGASTRODUODENOSCOPY (EGD) WITH PROPOFOL N/A 07/21/2013   Procedure: ESOPHAGOGASTRODUODENOSCOPY (EGD) WITH PROPOFOL;  Surgeon: Theda Belfast, MD;  Location: [facility] ENDOSCOPY;  Service: Endoscopy;  Laterality: N/A;   ESOPHAGOGASTRODUODENOSCOPY (EGD) WITH PROPOFOL N/A 05/24/2019   Procedure: ESOPHAGOGASTRODUODENOSCOPY (EGD) WITH PROPOFOL;  Surgeon: Jeani Hawking, MD;  Location: [facility] ENDOSCOPY;  Service: Endoscopy;  Laterality: N/A;   FOREIGN BODY REMOVAL  05/24/2019   Procedure: FOREIGN BODY REMOVAL;  Surgeon: Jeani Hawking, MD;  Location: [facility] ENDOSCOPY;  Service: Endoscopy;;   KIDNEY STONE SURGERY     PARATHYROIDECTOMY N/A 12/30/2015   Procedure: NECK EXPLORATION AND PARATHYROIDECTOMY;  Surgeon: Darnell Level, MD;  Location: [facility] ORS;  Service: General;  Laterality: N/A;   Social History   Socioeconomic History   Marital status: Single    Spouse name: Not on file   Number of children: 0    Years of education: Not on file   Highest education level: Not on file  Occupational History   Occupation: Mail carrier    Employer: [employer]   Occupation: Representative    Comment: Chiropractor  Tobacco Use   Smoking status: Never   Smokeless tobacco: Never  Vaping Use   Vaping Use: Never used  Substance and Sexual Activity   Alcohol use: No    Alcohol/week: 0.0 standard drinks of alcohol   Drug use: No   Sexual activity: Not Currently  Other Topics Concern   Not on file  Social History Narrative   Lives by himself, has a g-friend    Social Determinants of Corporate investment banker Strain: Not on file  Food Insecurity: Not on file  Transportation Needs: Not on file  Physical Activity: Not on file  Stress: Not on file  Social Connections: Not on file  Intimate Partner Violence: Not on file    Current Outpatient Medications  Medication Instructions   aspirin EC 81 mg, Oral, Daily, Swallow whole.       Objective:   Physical Exam BP (!) 144/86   Pulse 83   Temp 98.1 F (36.7 C) (Oral)   Resp 16   Ht 6\' 3"  (1.905 m)   Wt 216 lb 2 oz (98 kg)   SpO2 97%   BMI 27.01 kg/m  General: Well developed, NAD, BMI noted  Neck: No  thyromegaly  HEENT:  Normocephalic . Face symmetric, atraumatic Lungs:  CTA B Normal respiratory effort, no intercostal retractions, no accessory muscle use. Heart: RRR,  no murmur.  Abdomen:  Not distended, soft, non-tender. No rebound or rigidity.   Lower extremities: no pretibial edema bilaterally  Skin: Exposed areas without rash. Not pale. Not jaundice Neurologic:  alert & oriented X3.  Speech normal, gait appropriate for age and unassisted Strength symmetric and appropriate for age.  Psych: Cognition and judgment appear intact.  Cooperative with normal attention span and concentration.  Behavior appropriate. No anxious or depressed appearing.     Assessment    ASSESSMENT (transferred to this location  08-2020) Hyperglycemia - A1C 6.1 HTN BPH GI: Dr. Tonette Lederer Schatzki ring (sees GI, s/p dilatation) History of exploratory laparotomy at age 61 due to gastric ulcer, h/o  SBO Vitamin D deficiency BPH, cystoscopy 2019, LUTS.  Sees urology.  PLAN Here for CPX, see separate documentation. Hyperglycemia: Last A1c 5.9, we talk about the need to decrease carbohydrate intakes including avoid regular sodas. HTN: Currently on no meds.  BP slightly elevated upon arrival, recheck 144/86, at home in the 130s.  He is already following a low-salt diet.  No change for now Cardiovascular risk: Calculated at 11.6%. - Aspirin benefits discussed, h/o remote  PUD, we agreed to start 81 mg daily with strict GI precautions - Statins are recommended, patient will be very reluctant to take them.  Checking FLP today.  Reassess in 6 months RTC 6 months

## 2022-07-15 MED ORDER — ATORVASTATIN CALCIUM 20 MG PO TABS: 20.0000 mg | ORAL_TABLET | Freq: Every day | ORAL | 0 refills | Status: DC

## 2022-07-15 NOTE — Addendum Note (Signed)
Addended byConrad Tignall D on: 07/15/2022 09:38 AM   Modules accepted: Orders

## 2022-12-06 ENCOUNTER — Emergency Department (HOSPITAL_BASED_OUTPATIENT_CLINIC_OR_DEPARTMENT_OTHER)
Admission: EM | Admit: 2022-12-06 | Discharge: 2022-12-06 | Disposition: A | Discharge status: Home / Self Care | Payer: 59 | Attending: Emergency Medicine | Admitting: Emergency Medicine

## 2022-12-06 ENCOUNTER — Encounter (HOSPITAL_BASED_OUTPATIENT_CLINIC_OR_DEPARTMENT_OTHER): Payer: Self-pay | Admitting: Emergency Medicine

## 2022-12-06 DIAGNOSIS — I10 Essential (primary) hypertension: Secondary | ICD-10-CM | POA: Insufficient documentation

## 2022-12-06 DIAGNOSIS — M546 Pain in thoracic spine: Secondary | ICD-10-CM | POA: Diagnosis not present

## 2022-12-06 DIAGNOSIS — Z7982 Long term (current) use of aspirin: Secondary | ICD-10-CM | POA: Diagnosis not present

## 2022-12-06 DIAGNOSIS — Z91040 Latex allergy status: Secondary | ICD-10-CM | POA: Diagnosis not present

## 2022-12-06 LAB — URINALYSIS, ROUTINE W REFLEX MICROSCOPIC
Bilirubin Urine: NEGATIVE
Glucose, UA: NEGATIVE mg/dL
Hgb urine dipstick: NEGATIVE
Ketones, ur: NEGATIVE mg/dL
Leukocytes,Ua: NEGATIVE
Nitrite: NEGATIVE
Protein, ur: NEGATIVE mg/dL
Specific Gravity, Urine: 1.01 (ref 1.005–1.030)
pH: 6.5 (ref 5.0–8.0)

## 2022-12-06 LAB — CBC WITH DIFFERENTIAL/PLATELET
Abs Immature Granulocytes: 0.01 10*3/uL (ref 0.00–0.07)
Basophils Absolute: 0 10*3/uL (ref 0.0–0.1)
Basophils Relative: 1 %
Eosinophils Absolute: 0.1 10*3/uL (ref 0.0–0.5)
Eosinophils Relative: 2 %
HCT: 42.4 % (ref 39.0–52.0)
Hemoglobin: 14.4 g/dL (ref 13.0–17.0)
Immature Granulocytes: 0 %
Lymphocytes Relative: 27 %
Lymphs Abs: 1.8 10*3/uL (ref 0.7–4.0)
MCH: 30.4 pg (ref 26.0–34.0)
MCHC: 34 g/dL (ref 30.0–36.0)
MCV: 89.6 fL (ref 80.0–100.0)
Monocytes Absolute: 0.6 10*3/uL (ref 0.1–1.0)
Monocytes Relative: 8 %
Neutro Abs: 4.1 10*3/uL (ref 1.7–7.7)
Neutrophils Relative %: 62 %
Platelets: 220 10*3/uL (ref 150–400)
RBC: 4.73 MIL/uL (ref 4.22–5.81)
RDW: 12.5 % (ref 11.5–15.5)
WBC: 6.6 10*3/uL (ref 4.0–10.5)
nRBC: 0 % (ref 0.0–0.2)

## 2022-12-06 LAB — COMPREHENSIVE METABOLIC PANEL
ALT: 20 U/L (ref 0–44)
AST: 21 U/L (ref 15–41)
Albumin: 3.8 g/dL (ref 3.5–5.0)
Alkaline Phosphatase: 39 U/L (ref 38–126)
Anion gap: 9 (ref 5–15)
BUN: 13 mg/dL (ref 8–23)
CO2: 27 mmol/L (ref 22–32)
Calcium: 8.8 mg/dL — ABNORMAL LOW (ref 8.9–10.3)
Chloride: 102 mmol/L (ref 98–111)
Creatinine, Ser: 1.22 mg/dL (ref 0.61–1.24)
GFR, Estimated: >60 mL/min (ref >60)
Glucose, Bld: 92 mg/dL (ref 70–99)
Potassium: 3.9 mmol/L (ref 3.5–5.1)
Sodium: 138 mmol/L (ref 135–145)
Total Bilirubin: 1.1 mg/dL (ref 0.3–1.2)
Total Protein: 6.9 g/dL (ref 6.5–8.1)

## 2022-12-06 MED ORDER — TAMSULOSIN HCL 0.4 MG PO CAPS: 0.4000 mg | ORAL_CAPSULE | Freq: Every day | ORAL | 0 refills | Status: DC

## 2022-12-06 MED ORDER — ACETAMINOPHEN 500 MG PO TABS
1000.0000 mg | ORAL_TABLET | Freq: Once | ORAL | Status: AC
  Administered 2022-12-06: 1000 mg via ORAL
  Filled 2022-12-06: qty 2

## 2022-12-06 NOTE — Discharge Instructions (Signed)
We evaluated you for your back pain and your urinary symptoms.  Your laboratory testing and your physical exam was reassuring.  We did notice that you had trouble emptying your bladder.  I believe this is most likely due to a prostate issue.  I have prescribed you a medication called Flomax which you can take daily to help with this.  We did not need to place a urinary catheter.  I do not think your back pain is anything dangerous.  Your symptoms are most likely due to a muscle strain.  Please take at 1000 mg of Tylenol every 6 hours as needed for pain.  Please keep a close eye on your symptoms.  If you notice any new or worsening symptoms such as trouble walking, weakness, trouble urinating, incontinence, fevers or chills, numbness or tingling, or any other new symptoms, please return to the emergency department for reassessment.  Your blood pressure was slightly high in the emergency department.  Please keep a log of your blood pressure at home and follow-up with your primary doctor.

## 2022-12-06 NOTE — ED Triage Notes (Addendum)
Pt reports lower back pain and decreased urine output x 2 days; also noted BP was elevated today; no hx HTN; also reports slight HA

## 2022-12-06 NOTE — ED Provider Notes (Signed)
[place] [facility] Provider Note  CSN: [id number] Arrival date & time: 12/06/22 1303  Chief Complaint(s) Back Pain  HPI Gregory Sweeney is a 62 y.o. male with history of hypertension, BPH presenting to the emergency department with back pain.  He reports that he developed midthoracic back pain a few days ago.  Also had sensation of decreased urine output.  Today noticed high blood pressure.  No fevers or chills, abdominal pain, numbness or tingling, weakness, falls or trauma, trouble walking, bowel or bladder incontinence.  Was concerned about blood pressure decided to come to get evaluated.  Has not taken anything for symptoms at home. Reports very mild headache, no visual changes, weakness, numbness, tingling, speech changes, head injury, neck stiffness, fevers, chills, or any other symptoms.    Past Medical History Past Medical History:  Diagnosis Date   Appendicitis    Bowel obstruction (HCC)    Gastric ulcer    GERD (gastroesophageal reflux disease)    History of kidney stones    Hypertension    not on meds    Patient Active Problem List   Diagnosis Date Noted   Dysphagia 03/12/2021   Gastroesophageal reflux disease 03/12/2021   Stricture of esophagus 03/12/2021   PCP NOTES >>>>>>>>>>>> 12/03/2020   Benign prostatic hyperplasia with urinary hesitancy 04/21/2018   History of small bowel obstruction 06/14/2017   Visit for preventive health examination 06/12/2015   Annual physical exam 06/12/2015   Essential hypertension 10/21/2014   Home Medication(s) Prior to Admission medications   Medication Sig Start Date End Date Taking? Authorizing Provider  tamsulosin (FLOMAX) 0.4 MG CAPS capsule Take 1 capsule (0.4 mg total) by mouth daily after supper. 12/06/22  Yes Lonell Grandchild, MD  aspirin EC 81 MG tablet Take 1 tablet (81 mg total) by mouth daily. Swallow whole. 07/13/22   Wanda Plump, MD  atorvastatin (LIPITOR) 20 MG tablet Take 1  tablet (20 mg total) by mouth at bedtime. 07/15/22   Wanda Plump, MD                                                                                                                                    Past Surgical History Past Surgical History:  Procedure Laterality Date   APPENDECTOMY     BALLOON DILATION N/A 05/24/2019   Procedure: BALLOON DILATION;  Surgeon: Jeani Hawking, MD;  Location: [facility] ENDOSCOPY;  Service: Endoscopy;  Laterality: N/A;   BOWEL OBSTRUCTION     CHOLECYSTECTOMY     ESOPHAGEAL DILATION     ESOPHAGOGASTRODUODENOSCOPY (EGD) WITH PROPOFOL N/A 07/21/2013   Procedure: ESOPHAGOGASTRODUODENOSCOPY (EGD) WITH PROPOFOL;  Surgeon: Theda Belfast, MD;  Location: [facility] ENDOSCOPY;  Service: Endoscopy;  Laterality: N/A;   ESOPHAGOGASTRODUODENOSCOPY (EGD) WITH PROPOFOL N/A 05/24/2019   Procedure: ESOPHAGOGASTRODUODENOSCOPY (EGD) WITH PROPOFOL;  Surgeon: Jeani Hawking, MD;  Location: [facility] ENDOSCOPY;  Service: Endoscopy;  Laterality: N/A;  FOREIGN BODY REMOVAL  05/24/2019   Procedure: FOREIGN BODY REMOVAL;  Surgeon: Jeani Hawking, MD;  Location: [facility] ENDOSCOPY;  Service: Endoscopy;;   KIDNEY STONE SURGERY     PARATHYROIDECTOMY N/A 12/30/2015   Procedure: NECK EXPLORATION AND PARATHYROIDECTOMY;  Surgeon: Darnell Level, MD;  Location: [facility] ORS;  Service: General;  Laterality: N/A;   Family History Family History  Problem Relation Age of Onset   Healthy Mother    Healthy Father    Healthy Sister        x2   Healthy Brother        x1   Diabetes Paternal Aunt        Great Aunt   Cancer Neg Hx    Heart disease Neg Hx    Hypercalcemia Neg Hx    Hypertension Neg Hx     Social History Social History   Tobacco Use   Smoking status: Never   Smokeless tobacco: Never  Vaping Use   Vaping status: Never Used  Substance Use Topics   Alcohol use: No    Alcohol/week: 0.0 standard drinks of alcohol   Drug use: No   Allergies Ampicillin, Banana, Latex, Morphine and codeine, Other, Penicillins,  Rapaflo [silodosin], Shrimp [shellfish allergy], and Hibiclens [chlorhexidine gluconate]  Review of Systems Review of Systems  All other systems reviewed and are negative.   Physical Exam Vital Signs  I have reviewed the triage vital signs BP (!) 163/96   Pulse (!) 58   Temp (!) 97.5 F (36.4 C) (Oral)   Resp 18   Ht 6' 3.5" (1.918 m)   Wt 93.9 kg   SpO2 100%   BMI 25.53 kg/m  Physical Exam Vitals and nursing note reviewed.  Constitutional:      General: He is not in acute distress.    Appearance: Normal appearance.  HENT:     Mouth/Throat:     Mouth: Mucous membranes are moist.  Eyes:     Conjunctiva/sclera: Conjunctivae normal.  Cardiovascular:     Rate and Rhythm: Normal rate and regular rhythm.  Pulmonary:     Effort: Pulmonary effort is normal. No respiratory distress.     Breath sounds: Normal breath sounds.  Abdominal:     General: Abdomen is flat.     Palpations: Abdomen is soft.     Tenderness: There is no abdominal tenderness. There is no right CVA tenderness or left CVA tenderness.  Musculoskeletal:     Right lower leg: No edema.     Left lower leg: No edema.     Comments: No midline C, T, L-spine tenderness. Patient points to lateral mid thoracic/cva region as location of his back pain on bilateral sides but no appreciable tenderness  Skin:    General: Skin is warm and dry.     Capillary Refill: Capillary refill takes less than 2 seconds.  Neurological:     Mental Status: He is alert and oriented to person, place, and time. Mental status is at baseline.     Comments: Cranial nerves II through XII intact, strength 5 out of 5 in the bilateral upper and lower extremities, no sensory deficit to light touch, no dysmetria on finger-nose-finger testing, ambulatory with steady gait.   Psychiatric:        Mood and Affect: Mood normal.        Behavior: Behavior normal.     ED Results and Treatments Labs (all labs ordered are listed, but only abnormal  results are displayed) Labs Reviewed  COMPREHENSIVE METABOLIC PANEL - Abnormal; Notable for the following components:      Result Value   Calcium 8.8 (*)    All other components within normal limits  URINALYSIS, ROUTINE W REFLEX MICROSCOPIC  CBC WITH DIFFERENTIAL/PLATELET                                                                                                                          Radiology No results found.  Pertinent labs & imaging results that were available during my care of the patient were reviewed by me and considered in my medical decision making (see MDM for details).  Medications Ordered in ED Medications  acetaminophen (TYLENOL) tablet 1,000 mg (1,000 mg Oral Given 12/06/22 1605)                                                                                                                                     Procedures Procedures  (including critical care time)  Medical Decision Making / ED Course   MDM:  62 year old male presenting to the emergency department with urinary symptoms, back pain.  Patient well-appearing, physical exam with no focal tenderness.  No CVA tenderness.  No abdominal tenderness.  Unclear cause of symptoms, urinalysis without evidence of urinary infection or hematuria.  Doubt nephrolithiasis, symptoms are mild and no hematuria.  Reports back pain but very low concern for any dangerous cause of back pain such as spinal cord compression or cauda equina syndrome, strength, sensation normal.  Doubt urinary retention but patient does have history of BPH so we will check post void residual.  Will reassess.  Anticipate likely discharge with outpatient follow-up. Clinical Course as of 12/06/22 1704  Sun Dec 06, 2022  1700 Initial PVR was 300.  Patient encouraged to urinate more, was able to get PVR down to 200.  Patient does have history of BPH.  He is not on any medications for this currently.  Will prescribe Flomax.  Overall very low concern  for any dangerous cause of urinary retention such as spinal issue.  He has no midline back pain, normal neurologic exam.  Labs reassuring including normal kidney function.  Blood pressure high and encouraged patient to keep a log of this at home.  Discussed strict return precautions for any new neurologic symptoms but overall doubt this is related to any spinal cord issue or cauda equina syndrome. Will discharge patient to  home. All questions answered. Patient comfortable with plan of discharge. Return precautions discussed with patient and specified on the after visit summary.  [WS]    Clinical Course User Index [WS] Lonell Grandchild, MD     Additional history obtained: -Additional history obtained from spouse -External records from outside source obtained and reviewed including: Chart review including previous notes, labs, imaging, consultation notes including prior PMD notes    Lab Tests: -I ordered, reviewed, and interpreted labs.   The pertinent results include:   Labs Reviewed  COMPREHENSIVE METABOLIC PANEL - Abnormal; Notable for the following components:      Result Value   Calcium 8.8 (*)    All other components within normal limits  URINALYSIS, ROUTINE W REFLEX MICROSCOPIC  CBC WITH DIFFERENTIAL/PLATELET    Notable for normal UA, Normal CBC, minimal hypocalcemia   Medicines ordered and prescription drug management: Meds ordered this encounter  Medications   acetaminophen (TYLENOL) tablet 1,000 mg   tamsulosin (FLOMAX) 0.4 MG CAPS capsule    Sig: Take 1 capsule (0.4 mg total) by mouth daily after supper.    Dispense:  30 capsule    Refill:  0    -I have reviewed the patients home medicines and have made adjustments as needed   Reevaluation: After the interventions noted above, I reevaluated the patient and found that their symptoms have resolved  Co morbidities that complicate the patient evaluation  Past Medical History:  Diagnosis Date   Appendicitis     Bowel obstruction (HCC)    Gastric ulcer    GERD (gastroesophageal reflux disease)    History of kidney stones    Hypertension    not on meds       Dispostion: Disposition decision including need for hospitalization was considered, and patient discharged from emergency department.    Final Clinical Impression(s) / ED Diagnoses Final diagnoses:  Bilateral thoracic back pain, unspecified chronicity     This chart was dictated using voice recognition software.  Despite best efforts to proofread,  errors can occur which can change the documentation meaning.    Lonell Grandchild, MD 12/06/22 [phone]

## 2022-12-23 ENCOUNTER — Ambulatory Visit: Payer: 59 | Admitting: Internal Medicine

## 2022-12-23 ENCOUNTER — Encounter: Payer: Self-pay | Admitting: Internal Medicine

## 2022-12-23 VITALS — BP 162/90 | HR 76 | Temp 97.9°F | Resp 16 | Ht 75.0 in | Wt 213.2 lb

## 2022-12-23 DIAGNOSIS — Z23 Encounter for immunization: Secondary | ICD-10-CM | POA: Diagnosis not present

## 2022-12-23 DIAGNOSIS — R399 Unspecified symptoms and signs involving the genitourinary system: Secondary | ICD-10-CM

## 2022-12-23 DIAGNOSIS — I10 Essential (primary) hypertension: Secondary | ICD-10-CM

## 2022-12-23 LAB — URINALYSIS, ROUTINE W REFLEX MICROSCOPIC
Bilirubin Urine: NEGATIVE
Hgb urine dipstick: NEGATIVE
Ketones, ur: NEGATIVE
Leukocytes,Ua: NEGATIVE
Nitrite: NEGATIVE
RBC / HPF: NONE SEEN (ref 0–?)
Specific Gravity, Urine: 1.015 (ref 1.000–1.030)
Total Protein, Urine: NEGATIVE
Urine Glucose: NEGATIVE
Urobilinogen, UA: 0.2 (ref 0.0–1.0)
pH: 6.5 (ref 5.0–8.0)

## 2022-12-23 LAB — PSA: PSA: 1.29 ng/mL (ref 0.10–4.00)

## 2022-12-23 MED ORDER — AMLODIPINE BESYLATE 2.5 MG PO TABS: 2.5000 mg | ORAL_TABLET | Freq: Every day | ORAL | 3 refills | Status: DC

## 2022-12-23 NOTE — Patient Instructions (Addendum)
Start amlodipine 2.5 mg 1 tablet daily. Low-salt diet  Check the  blood pressure regularly Blood pressure goal:  between 110/65 and  135/85. If it is consistently higher or lower, let me know     GO TO THE LAB : Get the blood work     See you  next month

## 2022-12-23 NOTE — Assessment & Plan Note (Signed)
Recently since at the ER  HTN: Despite the diagnosis, BP has been normal until recently. Now  elevated  in the context of his stress. Today BP is 144/84, having similar readings at home. EKG today: NSR, normal, similar to previous. Plan: Amlodipine 2.5, low-salt diet, monitor BPs, keep appointment with me next month. LUTS: Some frequency urinating without other symptoms.  RX Flomax at the ER, was rec  to f/u w/  urology, Flomax has helped.  DRE today: benign except for possible mild tenderness on the right side of the prostate. Will check a UA, urine culture and PSA.  Further advised with results. RTC in November as recommended

## 2022-12-23 NOTE — Progress Notes (Signed)
Subjective:    Patient ID: Gregory Sweeney, male    DOB: [birth year], 62 y.o.   MRN: [id number]  DOS:  12/23/2022 Type of visit - description: Acute  Several weeks ago, he was under a lot of stress.  He admits that he stopped exercising, was not eating healthy.  Denies any NSAIDs.  For that reason, started to check his BP and it was elevated:  163/96, 152/101, 147/93 Also felt that he has some difficulty urinating, but denies dysuria or gross hematuria. No fever or chills. Denies headache, chest pain or difficulty breathing.  Went to the ER 12/06/2022 d/t elevated BP, LUTS, he also had midthoracic back pain. At the ER, BP was 163/96, heart rate 58. UA, CBC, CMP essentially normal except for calcium of 8.8.  At this point, BP remains slightly elevated at home but he feels much better emotionally.   Review of Systems See above   Past Medical History:  Diagnosis Date   Appendicitis    Bowel obstruction (HCC)    Gastric ulcer    GERD (gastroesophageal reflux disease)    History of kidney stones    Hypertension    not on meds     Past Surgical History:  Procedure Laterality Date   APPENDECTOMY     BALLOON DILATION N/A 05/24/2019   Procedure: BALLOON DILATION;  Surgeon: Jeani Hawking, MD;  Location: [facility] ENDOSCOPY;  Service: Endoscopy;  Laterality: N/A;   BOWEL OBSTRUCTION     CHOLECYSTECTOMY     ESOPHAGEAL DILATION     ESOPHAGOGASTRODUODENOSCOPY (EGD) WITH PROPOFOL N/A 07/21/2013   Procedure: ESOPHAGOGASTRODUODENOSCOPY (EGD) WITH PROPOFOL;  Surgeon: Theda Belfast, MD;  Location: [facility] ENDOSCOPY;  Service: Endoscopy;  Laterality: N/A;   ESOPHAGOGASTRODUODENOSCOPY (EGD) WITH PROPOFOL N/A 05/24/2019   Procedure: ESOPHAGOGASTRODUODENOSCOPY (EGD) WITH PROPOFOL;  Surgeon: Jeani Hawking, MD;  Location: [facility] ENDOSCOPY;  Service: Endoscopy;  Laterality: N/A;   FOREIGN BODY REMOVAL  05/24/2019   Procedure: FOREIGN BODY REMOVAL;  Surgeon: Jeani Hawking, MD;  Location: [facility] ENDOSCOPY;  Service:  Endoscopy;;   KIDNEY STONE SURGERY     PARATHYROIDECTOMY N/A 12/30/2015   Procedure: NECK EXPLORATION AND PARATHYROIDECTOMY;  Surgeon: Darnell Level, MD;  Location: [facility] ORS;  Service: General;  Laterality: N/A;    Current Outpatient Medications  Medication Instructions   aspirin EC 81 mg, Oral, Daily, Swallow whole.   atorvastatin (LIPITOR) 20 mg, Oral, Daily at bedtime   tamsulosin (FLOMAX) 0.4 mg, Oral, Daily after supper       Objective:   Physical Exam BP (!) 144/84   Pulse 76   Temp 97.9 F (36.6 C) (Oral)   Resp 16   Ht 6\' 3"  (1.905 m)   Wt 213 lb 4 oz (96.7 kg)   SpO2 96%   BMI 26.65 kg/m  General:   Well developed, NAD, BMI noted.  HEENT:  Normocephalic . Face symmetric, atraumatic Lungs:  CTA B Normal respiratory effort, no intercostal retractions, no accessory muscle use. Heart: RRR,  no murmur.  Abdomen:  Not distended, soft, non-tender. No rebound or rigidity.   Skin: Not pale. Not jaundice DRE: Normal sphincter tone, no stools, prostate is not increasing size, not nodular.  Slightly tender on the right side?. Lower extremities: no pretibial edema bilaterally  Neurologic:  alert & oriented X3.  Speech normal, gait appropriate for age and unassisted Psych--  Cognition and judgment appear intact.  Cooperative with normal attention span and concentration.  Behavior appropriate. No anxious or depressed appearing.  Assessment     ASSESSMENT (transferred to this location 08-2020) Hyperglycemia - A1C 6.1 HTN BPH GI: Dr. Tonette Lederer Schatzki ring (sees GI, s/p dilatation) History of exploratory laparotomy at age 25 due to gastric ulcer, h/o  SBO Vitamin D deficiency BPH, cystoscopy 2019, LUTS.  Sees urology.  PLAN Recently since at the ER  HTN: Despite the diagnosis, BP has been normal until recently. Now  elevated  in the context of his stress. Today BP is 144/84, having similar readings at home. EKG today: NSR, normal, similar to previous. Plan:  Amlodipine 2.5, low-salt diet, monitor BPs, keep appointment with me next month. LUTS: Some frequency urinating without other symptoms.  RX Flomax at the ER, was rec  to f/u w/  urology, Flomax has helped.  DRE today: benign except for possible mild tenderness on the right side of the prostate. Will check a UA, urine culture and PSA.  Further advised with results. RTC in November as recommended

## 2022-12-24 LAB — URINE CULTURE
MICRO NUMBER:: 15663702
Result:: NO GROWTH
SPECIMEN QUALITY:: ADEQUATE

## 2023-01-11 ENCOUNTER — Ambulatory Visit: Payer: 59 | Admitting: Internal Medicine

## 2023-01-13 ENCOUNTER — Ambulatory Visit: Payer: 59 | Admitting: Internal Medicine

## 2023-01-15 ENCOUNTER — Ambulatory Visit: Payer: 59 | Admitting: Internal Medicine

## 2023-01-15 VITALS — BP 132/80 | HR 67 | Temp 98.0°F | Resp 16 | Ht 75.0 in | Wt 215.0 lb

## 2023-01-15 DIAGNOSIS — Z0000 Encounter for general adult medical examination without abnormal findings: Secondary | ICD-10-CM

## 2023-01-15 DIAGNOSIS — I10 Essential (primary) hypertension: Secondary | ICD-10-CM

## 2023-01-15 DIAGNOSIS — E785 Hyperlipidemia, unspecified: Secondary | ICD-10-CM | POA: Diagnosis not present

## 2023-01-15 DIAGNOSIS — R399 Unspecified symptoms and signs involving the genitourinary system: Secondary | ICD-10-CM

## 2023-01-15 DIAGNOSIS — Z0001 Encounter for general adult medical examination with abnormal findings: Secondary | ICD-10-CM | POA: Diagnosis not present

## 2023-01-15 DIAGNOSIS — R739 Hyperglycemia, unspecified: Secondary | ICD-10-CM

## 2023-01-15 MED ORDER — ATORVASTATIN CALCIUM 20 MG PO TABS: 20.0000 mg | ORAL_TABLET | Freq: Every day | ORAL | 0 refills | Status: DC

## 2023-01-15 MED ORDER — TAMSULOSIN HCL 0.4 MG PO CAPS: 0.4000 mg | ORAL_CAPSULE | Freq: Every day | ORAL | 1 refills | Status: DC

## 2023-01-15 MED ORDER — AMLODIPINE BESYLATE 2.5 MG PO TABS: 2.5000 mg | ORAL_TABLET | Freq: Every day | ORAL | 1 refills | Status: DC

## 2023-01-15 MED ORDER — EPINEPHRINE 0.3 MG/0.3ML IJ SOAJ: 0.3000 mg | INTRAMUSCULAR | 3 refills | Status: DC | PRN

## 2023-01-15 NOTE — Patient Instructions (Addendum)
Vaccines I recommend: Covid booster Shingrix (shingles)  Atorvastatin 20 mg: Cholesterol medication Amlodipine 2.5 mg: Blood pressure medication Tamsulosin 0.4 mg: Urinary, bladder medication. EPIPEN:  Only to be used if you have a severe allergic reaction with generalized rash, difficulty breathing, lip or tongue swelling.  Go to the emergency room.  Check the  blood pressure regularly Blood pressure goal:  between 110/65 and  135/85. If it is consistently higher or lower, let me know    Come back in 6 weeks for blood work only. Next visit with me in 4 months.     Please schedule it at the front desk   Epinephrine Auto-Injector What is this medication? EPINEPHRINE (ep i NEF rin) treats severe allergic reactions (anaphylaxis). It may also be used to treat sudden asthma attacks. It reduces the effects of an allergic reaction, such as trouble breathing or swelling of the face, lips, and throat. Call emergency services after injection. You may need additional treatment. This medicine may be used for other purposes; ask your health care provider or pharmacist if you have questions. COMMON BRAND NAME(S): Adrenaclick, Auvi-Q, Epinephrine Professional EMS, Epinephrine Professional with Safety Seal, epinephrinesnap, epinephrinesnap-v, EpiPen, Epipen Jr, EPIsnap Epinephrine, SYMJEPI, Twinject What should I tell my care team before I take this medication? They need to know if you have any of the following conditions: Diabetes (high blood sugar) Glaucoma Heart disease High blood pressure Kidney disease Parkinson disease Pheochromocytoma Thyroid disease An unusual or allergic reaction to epinephrine, other medications, foods, dyes, or preservatives Pregnant or trying to get pregnant Breast-feeding How should I use this medication? This medication is injected into a muscle or under the skin. You will be taught how to prepare and give it. Take it as directed on the prescription label. Do not  use it more often than directed. It is important that you put your used needles and syringes in a special sharps container. Do not put them in a trash can. If you do not have a sharps container, call your pharmacist or care team to get one. This medication comes with INSTRUCTIONS FOR USE. Ask your pharmacist for directions on how to use this medication. Read the information carefully. Talk to your pharmacist or care team if you have questions. Talk to your care team about the use of this medication in children. While it may be prescribed for selected conditions, precautions do apply. Overdosage: If you think you have taken too much of this medicine contact a poison control center or emergency room at once. NOTE: This medicine is only for you. Do not share this medicine with others. What if I miss a dose? This does not apply. This medication is not for regular use. It should only be used as needed. What may interact with this medication? Do not take this medication with any of the following: General anesthetics, such as desflurane, isoflurane, sevoflurane This medication may also interact with the following: Antihistamines for allergy, cough, and cold Certain medications for blood pressure, heart disease, irregular heart beat Certain medications for mental health conditions Certain medications for Parkinson disease, such as entacapone Digoxin Diuretics Doxapram Ergot alkaloids, such as dihydroergotamine, ergonovine, ergotamine, methylergonovine Levothyroxine MAOIs, such as Marplan, Nardil, and Parnate Oxytocin Phenothiazines, such as chlorpromazine, prochlorperazine, thioridazine Steroid medications, such as prednisone or cortisone Theophylline This list may not describe all possible interactions. Give your health care provider a list of all the medicines, herbs, non-prescription drugs, or dietary supplements you use. Also tell them if you smoke, drink  alcohol, or use illegal drugs. Some  items may interact with your medicine. What should I watch for while using this medication? Visit your care team for regular checks on your progress. Tell your care team if your symptoms do not start to get better or if they get worse. Call emergency services if you have trouble breathing. What side effects may I notice from receiving this medication? Side effects that you should report to your care team as soon as possible: Allergic reactions--skin rash, itching, hives, swelling of the face, lips, tongue, or throat Heart attack--pain or tightness in the chest, shoulders, arms, or jaw, nausea, shortness of breath, cold or clammy skin, feeling faint or lightheaded Heart rhythm changes--fast or irregular heartbeat, dizziness, feeling faint or lightheaded, chest pain, trouble breathing Kidney injury--decrease in the amount of urine, swelling of the ankles, hands, or feet Pain, redness, or irritation at injection site Side effects that usually do not require medical attention (report to your care team if they continue or are bothersome): Anxiety, nervousness Dizziness Headache Heart palpitations--rapid, pounding, or irregular heartbeat Muscle weakness Nausea Pale skin, loss of color in lining of the eyelids, inner mouth, or nails Sweating Tremors or shaking Vomiting This list may not describe all possible side effects. Call your doctor for medical advice about side effects. You may report side effects to FDA at 1-800-FDA-1088. Where should I keep my medication? Keep out of the reach of children and pets. Store at room temperature between 20 and 25 degrees C (68 and 77 degrees F). Do not freeze. Avoid exposure to extreme heat or cold. For example, do not keep it in a vehicle's glove box. Protect from light. Keep it in the outer case until you are ready to take it. Get rid of any unused medication after the expiration date. To get rid of medications that are no longer needed or have  expired: Take the medication to a medication take-back program. Check with your pharmacy or law enforcement to find a location. If you cannot return the medication, ask your pharmacist or care team how to get rid of this medication safely. NOTE: This sheet is a summary. It may not cover all possible information. If you have questions about this medicine, talk to your doctor, pharmacist, or health care provider.  2024 Elsevier/Gold Standard (2022-04-24 00:00:00)

## 2023-01-15 NOTE — Progress Notes (Addendum)
 Subjective:    Patient ID: Gregory Sweeney, male    DOB: Sep 23, 1960, 62 y.o.   MRN: [id number]  DOS:  01/15/2023 Type of visit - description: Here for CPX  Here for CPE, chronic medical problems addressed. He was taking tamsulosin  and amlodipine  but ran out about a week ago. Had no side effects. While on tamsulosin , had no LUTS, now feels that his flow is slightly slow.  Is not taking atorvastatin .  Review of Systems  Other than above, a 14 point review of systems is negative    Past Medical History:  Diagnosis Date   Appendicitis    Bowel obstruction (HCC)    Gastric ulcer    GERD (gastroesophageal reflux disease)    History of kidney stones    Hypertension    not on meds     Past Surgical History:  Procedure Laterality Date   APPENDECTOMY     BALLOON DILATION N/A 05/24/2019   Procedure: BALLOON DILATION;  Surgeon: Alvis Jourdain, MD;  Location: [facility] ENDOSCOPY;  Service: Endoscopy;  Laterality: N/A;   BOWEL OBSTRUCTION     CHOLECYSTECTOMY     ESOPHAGEAL DILATION     ESOPHAGOGASTRODUODENOSCOPY (EGD) WITH PROPOFOL  N/A 07/21/2013   Procedure: ESOPHAGOGASTRODUODENOSCOPY (EGD) WITH PROPOFOL ;  Surgeon: Almeda Aris, MD;  Location: [facility] ENDOSCOPY;  Service: Endoscopy;  Laterality: N/A;   ESOPHAGOGASTRODUODENOSCOPY (EGD) WITH PROPOFOL  N/A 05/24/2019   Procedure: ESOPHAGOGASTRODUODENOSCOPY (EGD) WITH PROPOFOL ;  Surgeon: Alvis Jourdain, MD;  Location: [facility] ENDOSCOPY;  Service: Endoscopy;  Laterality: N/A;   FOREIGN BODY REMOVAL  05/24/2019   Procedure: FOREIGN BODY REMOVAL;  Surgeon: Alvis Jourdain, MD;  Location: [facility] ENDOSCOPY;  Service: Endoscopy;;   KIDNEY STONE SURGERY     PARATHYROIDECTOMY N/A 12/30/2015   Procedure: NECK EXPLORATION AND PARATHYROIDECTOMY;  Surgeon: Oralee Billow, MD;  Location: [facility] ORS;  Service: General;  Laterality: N/A;   Social History   Socioeconomic History   Marital status: Single    Spouse name: Not on file   Number of children: 0   Years of education: Not  on file   Highest education level: Bachelor's degree (e.g., BA, AB, BS)  Occupational History   Occupation: Mail carrier    Employer: [employer]   Occupation: Technical brewer    Comment: Chiropractor  Tobacco Use   Smoking status: Never   Smokeless tobacco: Never  Vaping Use   Vaping status: Never Used  Substance and Sexual Activity   Alcohol use: No    Alcohol/week: 0.0 standard drinks of alcohol   Drug use: No   Sexual activity: Not Currently  Other Topics Concern   Not on file  Social History Narrative   Lives by himself, has a g-friend    Social Determinants of Health   Financial Resource Strain: Low Risk  (01/15/2023)   Overall Financial Resource Strain (CARDIA)    Difficulty of Paying Living Expenses: Not hard at all  Food Insecurity: No Food Insecurity (01/15/2023)   Hunger Vital Sign    Worried About Running Out of Food in the Last Year: Never true    Ran Out of Food in the Last Year: Never true  Transportation Needs: No Transportation Needs (01/15/2023)   PRAPARE - Administrator, Civil Service (Medical): No    Lack of Transportation (Non-Medical): No  Physical Activity: Insufficiently Active (01/15/2023)   Exercise Vital Sign    Days of Exercise per Week: 1 day    Minutes of Exercise per Session: 30 min  Stress: No Stress Concern Present (01/15/2023)   [employer] of Occupational Health - Occupational Stress Questionnaire    Feeling of Stress : Not at all  Social Connections: Moderately Integrated (01/15/2023)   Social Connection and Isolation Panel [NHANES]    Frequency of Communication with Friends and Family: More than three times a week    Frequency of Social Gatherings with Friends and Family: More than three times a week    Attends Religious Services: More than 4 times per year    Active Member of [employer] or Organizations: Yes    Attends Banker Meetings: 1 to 4 times per year    Marital Status: Never married   Catering manager Violence: Not on file    Current Outpatient Medications  Medication Instructions   amLODipine  (NORVASC ) 2.5 mg, Oral, Daily   atorvastatin  (LIPITOR) 20 mg, Oral, Daily at bedtime   EPINEPHrine  (EPIPEN  2-PAK) 0.3 mg, Intramuscular, As needed   tamsulosin  (FLOMAX ) 0.4 mg, Oral, Daily after supper       Objective:   Physical Exam BP 132/80   Pulse 67   Temp 98 F (36.7 C) (Oral)   Resp 16   Ht 6\' 3"  (1.905 m)   Wt 215 lb (97.5 kg)   SpO2 97%   BMI 26.87 kg/m  General: Well developed, NAD, BMI noted Neck: No  thyromegaly  HEENT:  Normocephalic . Face symmetric, atraumatic Lungs:  CTA B Normal respiratory effort, no intercostal retractions, no accessory muscle use. Heart: RRR,  no murmur.  Abdomen:  Not distended, soft, non-tender. No rebound or rigidity.   Lower extremities: no pretibial edema bilaterally  Skin: Exposed areas without rash. Not pale. Not jaundice Neurologic:  alert & oriented X3.  Speech normal, gait appropriate for age and unassisted Strength symmetric and appropriate for age.  Psych: Cognition and judgment appear intact.  Cooperative with normal attention span and concentration.  Behavior appropriate. No anxious or depressed appearing.     Assessment   ASSESSMENT (transferred to this location 08-2020) Hyperglycemia - A1C 6.1 HTN BPH GI: Dr. Valinda Gault Schatzki ring (sees GI, s/p dilatation) History of exploratory laparotomy at age 41 due to gastric ulcer, h/o  SBO Vitamin D  deficiency BPH, cystoscopy 2019, LUTS.  Sees urology.  PLAN Here for CPX Tdap 2024 Had a flu shot Vaccines I recommend: COVID, Shingrix.   CCS: multiple cscopes , last colonoscopy 08/04/2018, next 10 years per report. Prostate cancer screening: See comments under LUTS Future labs: FLP AST ALT A1c PSA Patient education: Diet and exercise, low salt intake. HTN: Took amlodipine  2.5 mg without problems, BP was 118/70.  Ran out of amlodipine  a week  ago, BP today 132/80.  Plan: RF amlodipine  2.5 mg. LUTS:  See last visit, he had some urinary frequency, DRE benign except for possible mild tenderness.  PSA was 1.29, urine culture negative.  Symptoms improved with Flomax . Plan: Rx Flomax , recheck PSA in 6 weeks. High cholesterol: CV risk 16.4%, benefit of statins discussed.  Previously was prescribed atorvastatin  but did not start, is willing to try at this time.  Rx sent, labs in 6 weeks. Severe allergies: Recommend EpiPen , how to use it explained to the patient. Patient education: We discussed everything single medication, how to take it and what it is for. RTC labs 6 weeks RTC office visit 4 months

## 2023-01-17 ENCOUNTER — Encounter: Payer: Self-pay | Admitting: Internal Medicine

## 2023-01-17 NOTE — Assessment & Plan Note (Signed)
Here for CPX HTN: Took amlodipine 2.5 mg without problems, BP was 118/70.  Ran out of amlodipine a week ago, BP today 132/80.  Plan: RF amlodipine 2.5 mg. LUTS:  See last visit, he had some urinary frequency, DRE benign except for possible mild tenderness.  PSA was 1.29, urine culture negative.  Symptoms improved with Flomax. Plan: Rx Flomax, recheck PSA in 6 weeks. High cholesterol: CV risk 16.4%, benefit of statins discussed.  Previously was prescribed atorvastatin but did not start, is willing to try at this time.  Rx sent, labs in 6 weeks. Severe allergies: Recommend EpiPen, how to use it explained to the patient. Patient education: We discussed everything single medication, how to take it and what it is for. RTC labs 6 weeks RTC office visit 4 months

## 2023-01-17 NOTE — Assessment & Plan Note (Signed)
Here for CPX Tdap 2024 Had a flu shot Vaccines I recommend: COVID, Shingrix.   CCS: multiple cscopes , last colonoscopy 08/04/2018, next 10 years per report. Prostate cancer screening: See comments under LUTS Future labs: FLP AST ALT A1c PSA Patient education: Diet and exercise, low salt intake.

## 2023-02-26 ENCOUNTER — Ambulatory Visit: Payer: 59 | Admitting: Internal Medicine

## 2023-03-17 ENCOUNTER — Other Ambulatory Visit: Payer: Self-pay | Admitting: Internal Medicine

## 2023-03-28 ENCOUNTER — Encounter: Payer: Self-pay | Admitting: Internal Medicine

## 2023-06-10 ENCOUNTER — Encounter: Payer: Self-pay | Admitting: Internal Medicine

## 2023-06-15 ENCOUNTER — Ambulatory Visit (INDEPENDENT_AMBULATORY_CARE_PROVIDER_SITE_OTHER): Admitting: Internal Medicine

## 2023-06-15 ENCOUNTER — Encounter: Payer: Self-pay | Admitting: Internal Medicine

## 2023-06-15 VITALS — BP 136/70 | HR 69 | Temp 97.9°F | Resp 16 | Ht 75.0 in | Wt 216.0 lb

## 2023-06-15 DIAGNOSIS — E785 Hyperlipidemia, unspecified: Secondary | ICD-10-CM

## 2023-06-15 DIAGNOSIS — R7989 Other specified abnormal findings of blood chemistry: Secondary | ICD-10-CM

## 2023-06-15 DIAGNOSIS — I10 Essential (primary) hypertension: Secondary | ICD-10-CM

## 2023-06-15 DIAGNOSIS — R399 Unspecified symptoms and signs involving the genitourinary system: Secondary | ICD-10-CM

## 2023-06-15 DIAGNOSIS — R739 Hyperglycemia, unspecified: Secondary | ICD-10-CM | POA: Diagnosis not present

## 2023-06-15 NOTE — Patient Instructions (Addendum)
 INSTRUCTIONS  FOR TODAY       GO TO THE LAB : Get the blood work and provide a urine sample   Next office visit for a checkup in 5 to 6 months Please make an appointment before you leave today

## 2023-06-15 NOTE — Progress Notes (Signed)
 Subjective:    Patient ID: Gregory Sweeney, male    DOB: Mar 22, 1960, 63 y.o.   MRN: [id number]  DOS:  06/15/2023 Type of visit - description: follow up  Routine follow-up. Started Lipitor, good compliance, denies any myalgias or unusual arthralgias.  Has developed however a pain at the perineum between the anus and the scrotum. Denies any rash, lump. Has occasional dysuria but no blood in the urine, no difficulty urinating, urinary frequency is actually better since he started Flomax . Denies any recent sexual activity specifically no unprotected sex. No penile discharge.   Review of Systems See above   Past Medical History:  Diagnosis Date   Appendicitis    Bowel obstruction (HCC)    Gastric ulcer    GERD (gastroesophageal reflux disease)    History of kidney stones    Hypertension    not on meds     Past Surgical History:  Procedure Laterality Date   APPENDECTOMY     BALLOON DILATION N/A 05/24/2019   Procedure: BALLOON DILATION;  Surgeon: Alvis Jourdain, MD;  Location: [facility] ENDOSCOPY;  Service: Endoscopy;  Laterality: N/A;   BOWEL OBSTRUCTION     CHOLECYSTECTOMY     ESOPHAGEAL DILATION     ESOPHAGOGASTRODUODENOSCOPY (EGD) WITH PROPOFOL  N/A 07/21/2013   Procedure: ESOPHAGOGASTRODUODENOSCOPY (EGD) WITH PROPOFOL ;  Surgeon: Almeda Aris, MD;  Location: [facility] ENDOSCOPY;  Service: Endoscopy;  Laterality: N/A;   ESOPHAGOGASTRODUODENOSCOPY (EGD) WITH PROPOFOL  N/A 05/24/2019   Procedure: ESOPHAGOGASTRODUODENOSCOPY (EGD) WITH PROPOFOL ;  Surgeon: Alvis Jourdain, MD;  Location[facility] ENDOSCOPY;  Service: Endoscopy;  Laterality: N/A;   FOREIGN BODY REMOVAL  05/24/2019   Procedure: FOREIGN BODY REMOVAL;  Surgeon: Alvis Jourdain, MD;  Location: [facility] ENDOSCOPY;  Service: Endoscopy;;   KIDNEY STONE SURGERY     PARATHYROIDECTOMY N/A 12/30/2015   Procedure: NECK EXPLORATION AND PARATHYROIDECTOMY;  Surgeon: Oralee Billow, MD;  Location: [facility] ORS;  Service: General;  Laterality: N/A;    Current Outpatient  Medications  Medication Instructions   amLODipine  (NORVASC ) 2.5 mg, Oral, Daily   atorvastatin  (LIPITOR) 20 mg, Oral, Daily at bedtime   EPINEPHrine  (EPIPEN  2-PAK) 0.3 mg, Intramuscular, As needed   tamsulosin  (FLOMAX ) 0.4 mg, Oral, Daily after supper       Objective:   Physical Exam BP 136/70   Pulse 69   Temp 97.9 F (36.6 C) (Oral)   Resp 16   Ht 6\' 3"  (1.905 m)   Wt 216 lb (98 kg)   SpO2 97%   BMI 27.00 kg/m  General:   Well developed, NAD, BMI noted. HEENT:  Normocephalic . Face symmetric, atraumatic Lungs:  CTA B Normal respiratory effort, no intercostal retractions, no accessory muscle use. Heart: RRR,  no murmur.  Lower extremities: no pretibial edema bilaterally GU: Normal scrotal contents, penis normal. DRE: Normal to inspection, normal rectal tone without mass.  Prostate slightly enlarged but nontender. Skin: Not pale. Not jaundice Neurologic:  alert & oriented X3.  Speech normal, gait appropriate for age and unassisted Psych--  Cognition and judgment appear intact.  Cooperative with normal attention span and concentration.  Behavior appropriate. No anxious or depressed appearing.      Assessment     ASSESSMENT (transferred to this location 08-2020) Hyperglycemia - A1C 6.1 HTN High cholesterol BPH GI: Dr. Valinda Gault Schatzki ring (sees GI, s/p dilatation) History of exploratory laparotomy at age 15 due to gastric ulcer, h/o  SBO Vitamin D  deficiency BPH, cystoscopy 2019, LUTS.  Sees urology.  PLAN HTN: Good compliance with  amlodipine , BP looks very good, no change.  Check a BMP High cholesterol: See last visit, 4 months ago, started atorvastatin , check FLP AST ALT Hyperglycemia: Checking A1c LUTS: Overall symptoms are better with Flomax , has however developed perineal pain for the last few months (he thinks since he started atorvastatin ). GU exam is actually benign.  No risk for STDs. Will continue Flomax , check PSA, UA urine culture.   Further advised for results. RTC 4 to 6 months

## 2023-06-16 LAB — LIPID PANEL
Cholesterol: 111 mg/dL (ref 0–200)
HDL: 38.1 mg/dL — ABNORMAL LOW (ref >39.00)
LDL Cholesterol: 50 mg/dL (ref 0–99)
NonHDL: 72.49
Total CHOL/HDL Ratio: 3
Triglycerides: 110 mg/dL (ref 0.0–149.0)
VLDL: 22 mg/dL (ref 0.0–40.0)

## 2023-06-16 LAB — URINALYSIS, ROUTINE W REFLEX MICROSCOPIC
Bilirubin Urine: NEGATIVE
Hgb urine dipstick: NEGATIVE
Ketones, ur: NEGATIVE
Leukocytes,Ua: NEGATIVE
Nitrite: NEGATIVE
RBC / HPF: NONE SEEN (ref 0–?)
Specific Gravity, Urine: 1.02 (ref 1.000–1.030)
Total Protein, Urine: NEGATIVE
Urine Glucose: NEGATIVE
Urobilinogen, UA: 0.2 (ref 0.0–1.0)
pH: 6.5 (ref 5.0–8.0)

## 2023-06-16 LAB — URINE CULTURE
MICRO NUMBER:: 16359460
Result:: NO GROWTH
SPECIMEN QUALITY:: ADEQUATE

## 2023-06-16 LAB — BASIC METABOLIC PANEL WITH GFR
BUN: 16 mg/dL (ref 6–23)
CO2: 28 meq/L (ref 19–32)
Calcium: 9.1 mg/dL (ref 8.4–10.5)
Chloride: 106 meq/L (ref 96–112)
Creatinine, Ser: 1.32 mg/dL (ref 0.40–1.50)
GFR: 56.72 mL/min — ABNORMAL LOW (ref >60.00)
Glucose, Bld: 86 mg/dL (ref 70–99)
Potassium: 4.3 meq/L (ref 3.5–5.1)
Sodium: 142 meq/L (ref 135–145)

## 2023-06-16 LAB — HEMOGLOBIN A1C: Hgb A1c MFr Bld: 6.3 % (ref 4.6–6.5)

## 2023-06-16 LAB — ALT: ALT: 67 U/L — ABNORMAL HIGH (ref 0–53)

## 2023-06-16 LAB — PSA: PSA: 1.08 ng/mL (ref 0.10–4.00)

## 2023-06-16 LAB — AST: AST: 32 U/L (ref 0–37)

## 2023-06-16 NOTE — Assessment & Plan Note (Signed)
 HTN: Good compliance with amlodipine , BP looks very good, no change.  Check a BMP High cholesterol: See last visit, 4 months ago, started atorvastatin , check FLP AST ALT Hyperglycemia: Checking A1c LUTS: Overall symptoms are better with Flomax , has however developed perineal pain for the last few months (he thinks since he started atorvastatin ). GU exam is actually benign.  No risk for STDs. Will continue Flomax , check PSA, UA urine culture.  Further advised for results. RTC 4 to 6 months

## 2023-06-18 ENCOUNTER — Encounter: Payer: Self-pay | Admitting: Internal Medicine

## 2023-06-18 MED ORDER — ATORVASTATIN CALCIUM 20 MG PO TABS: 20.0000 mg | ORAL_TABLET | Freq: Every day | ORAL | 1 refills | Status: DC

## 2023-06-18 NOTE — Addendum Note (Signed)
 Addended by: Cobain Morici D on: 06/18/2023 03:03 PM   Modules accepted: Orders

## 2023-06-21 ENCOUNTER — Other Ambulatory Visit: Payer: Self-pay | Admitting: Internal Medicine

## 2023-06-21 NOTE — Telephone Encounter (Signed)
 Copied from CRM [id number]. Topic: Clinical - Medication Refill >> Jun 21, 2023 11:26 AM Howard Macho wrote: Most Recent Primary Care Visit:  Provider: Devonna Foley E  Department: [facility]  Visit Type: OFFICE VISIT  Date: 06/15/2023  Medication: tamsulosin  (FLOMAX ) 0.4 MG CAPS capsule, amLODipine  (NORVASC ) 2.5 MG tablet, EPINEPHrine  (EPIPEN  2-PAK) 0.3 mg/0.3 mL IJ SOAJ injection  Has the patient contacted their pharmacy? Yes (Agent: If no, request that the patient contact the pharmacy for the refill. If patient does not wish to contact the pharmacy document the reason why and proceed with request.) (Agent: If yes, when and what did the pharmacy advise?)  Is this the correct pharmacy for this prescription? Yes If no, delete pharmacy and type the correct one.  This is the patient's preferred pharmacy:  [id number], [place] - [address] AT [facility] [address] [place] [address] Phone: [phone] Fax: [phone]   Has the prescription been filled recently? No  Is the patient out of the medication? Yes  Has the patient been seen for an appointment in the last year OR does the patient have an upcoming appointment? Yes  Can we respond through MyChart? Yes  Agent: Please be advised that Rx refills may take up to 3 business days. We ask that you follow-up with your pharmacy.

## 2023-06-22 MED ORDER — TAMSULOSIN HCL 0.4 MG PO CAPS: 0.4000 mg | ORAL_CAPSULE | Freq: Every day | ORAL | 1 refills | Status: DC

## 2023-06-22 MED ORDER — EPINEPHRINE 0.3 MG/0.3ML IJ SOAJ: 0.3000 mg | INTRAMUSCULAR | 3 refills | Status: AC | PRN

## 2023-06-22 MED ORDER — AMLODIPINE BESYLATE 2.5 MG PO TABS: 2.5000 mg | ORAL_TABLET | Freq: Every day | ORAL | 1 refills | Status: DC

## 2023-07-22 ENCOUNTER — Ambulatory Visit: Admitting: *Deleted

## 2023-07-22 VITALS — Ht 75.0 in | Wt 216.0 lb

## 2023-07-22 DIAGNOSIS — Z0000 Encounter for general adult medical examination without abnormal findings: Secondary | ICD-10-CM | POA: Diagnosis not present

## 2023-07-22 NOTE — Patient Instructions (Addendum)
 Gregory Sweeney , Thank you for taking time out of your busy schedule to complete your Annual Wellness Visit with me. I enjoyed our conversation and look forward to speaking with you again next year. I, as well as your care team,  appreciate your ongoing commitment to your health goals. Please review the following plan we discussed and let me know if I can assist you in the future. Your Game plan/ To Do List     Follow up Visits: Next Medicare AWV with our clinical staff: 07/25/24 @ 8:40am   Next Office Visit with your provider: 10/27/23 @ 3:20  Clinician Recommendations:  Aim for 30 minutes of exercise or brisk walking, 6-8 glasses of water, and 5 servings of fruits and vegetables each day.       This is a list of the screening recommended for you and due dates:  Health Maintenance  Topic Date Due   Pneumonia Vaccine (1 of 1 - PCV) Never done   Zoster (Shingles) Vaccine (1 of 2) Never done   COVID-19 Vaccine (5 - 2024-25 season) 10/25/2022   Flu Shot  09/24/2023   Medicare Annual Wellness Visit  07/21/2024   Colon Cancer Screening  08/03/2028   DTaP/Tdap/Td vaccine (3 - Td or Tdap) 05/06/2032   Hepatitis C Screening  Completed   HIV Screening  Completed   HPV Vaccine  Aged Out   Meningitis B Vaccine  Aged Out   I made note for you to get your pneumonia vaccine at your next appointment with Dr Neomi Banks.  You will need to get the COVID and Shingles vaccines from your pharmacy and may want to do those on separate visits.  Advanced directives: (ACP Link)Information on Advanced Care Planning can be found at [place]  Secretary of [facility] Advance Health Care Directives Advance Health Care Directives. [email or web address]  Advance Care Planning is important because it:  [x]  Makes sure you receive the medical care that is consistent with your values, goals, and preferences  [x]  It provides guidance to your family and loved ones and reduces their decisional burden about whether or not they are making the right  decisions based on your wishes.  Follow the link provided in your after visit summary or read over the paperwork we have mailed to you to help you started getting your Advance Directives in place. If you need assistance in completing these, please reach out to us  so that we can help you!  See attachments for Preventive Care and Fall Prevention Tips.

## 2023-07-22 NOTE — Progress Notes (Signed)
 Please attest this visit in the absence of patient primary care provider.    Subjective:   Gregory Sweeney is a 63 y.o. who presents for a Medicare Wellness preventive visit.  As a reminder, Annual Wellness Visits don't include a physical exam, and some assessments may be limited, especially if this visit is performed virtually. We may recommend an in-person follow-up visit with your provider if needed.  Visit Complete: Virtual I connected with  [name] on 07/22/23 by a audio enabled telemedicine application and verified that I am speaking with the correct person using two identifiers.  Patient Location: Home  Provider Location: Office/Clinic  I discussed the limitations of evaluation and management by telemedicine. The patient expressed understanding and agreed to proceed.  Vital Signs: Because this visit was a virtual/telehealth visit, some criteria may be missing or patient reported. Any vitals not documented were not able to be obtained and vitals that have been documented are patient reported.  VideoDeclined- This patient declined Librarian, academic. Therefore the visit was completed with audio only.  Persons Participating in Visit: Patient.  AWV Questionnaire: No: Patient Medicare AWV questionnaire was not completed prior to this visit.  Cardiac Risk Factors include: advanced age (>26men, >61 women);male gender;hypertension     Objective:     Today's Vitals   07/22/23 0823  Weight: 216 lb (98 kg)  Height: 6\' 3"  (1.905 m)   Body mass index is 27 kg/m.     07/22/2023    8:53 AM 12/06/2022    1:29 PM 12/21/2020    6:32 PM 05/24/2019   11:36 AM 03/08/2018    4:04 PM 03/08/2018   10:36 AM 12/30/2015    6:00 PM  Advanced Directives  Does Patient Have a Medical Advance Directive? No No No No No No No  Would patient like information on creating a medical advance directive? Yes (MAU/Ambulatory/Procedural Areas - Information given)  No -  Patient declined No - Patient declined No - Patient declined No - Patient declined     Current Medications (verified) Outpatient Encounter Medications as of 07/22/2023  Medication Sig   amLODipine  (NORVASC ) 2.5 MG tablet Take 1 tablet (2.5 mg total) by mouth daily.   atorvastatin  (LIPITOR) 20 MG tablet Take 1 tablet (20 mg total) by mouth at bedtime.   EPINEPHrine  (EPIPEN  2-PAK) 0.3 mg/0.3 mL IJ SOAJ injection Inject 0.3 mg into the muscle as needed for anaphylaxis.   tamsulosin  (FLOMAX ) 0.4 MG CAPS capsule Take 1 capsule (0.4 mg total) by mouth daily after supper.   No facility-administered encounter medications on file as of 07/22/2023.    Allergies (verified) Ampicillin, Banana, Latex, Morphine and codeine, Other, Penicillins, Rapaflo  [silodosin ], Shrimp [shellfish allergy], and Hibiclens  [chlorhexidine  gluconate]   History: Past Medical History:  Diagnosis Date   Appendicitis    Bowel obstruction (HCC)    Gastric ulcer    GERD (gastroesophageal reflux disease)    History of kidney stones    Hypertension    not on meds    Past Surgical History:  Procedure Laterality Date   APPENDECTOMY     BALLOON DILATION N/A 05/24/2019   Procedure: BALLOON DILATION;  Surgeon: Alvis Jourdain, MD;  Location: [facility] ENDOSCOPY;  Service: Endoscopy;  Laterality: N/A;   BOWEL OBSTRUCTION     CHOLECYSTECTOMY     ESOPHAGEAL DILATION     ESOPHAGOGASTRODUODENOSCOPY (EGD) WITH PROPOFOL  N/A 07/21/2013   Procedure: ESOPHAGOGASTRODUODENOSCOPY (EGD) WITH PROPOFOL ;  Surgeon: Almeda Aris, MD;  Location: [facility]  ENDOS[facility];  Service: Endoscopy;  Laterality: N/A;   ESOPHAGOGASTRODUODENOSCOPY (EGD) WITH PROPOFOL  N/A 05/24/2019   Procedure: ESOPHAGOGASTRODUODENOSCOPY (EGD) WITH PROPOFOL ;  Surgeon: Alvis Jourdain, MD;  Location[facility] ENDOSCOPY;  Service: Endoscopy;  Laterality: N/A;   FOREIGN BODY REMOVAL  05/24/2019   Procedure: FOREIGN BODY REMOVAL;  Surgeon: Alvis Jourdain, MD;  Location: [facility] ENDOSCOPY;  Service:  Endoscopy;;   LITHOTRIPSY     PARATHYROIDECTOMY N/A 12/30/2015   Procedure: NECK EXPLORATION AND PARATHYROIDECTOMY;  Surgeon: Oralee Billow, MD;  Location: [facility] ORS;  Service: General;  Laterality: N/A;   Family History  Problem Relation Age of Onset   Healthy Mother    Healthy Father    Healthy Sister        x2   Healthy Brother        x1   Diabetes Paternal Aunt        Great Aunt   Cancer Neg Hx    Heart disease Neg Hx    Hypercalcemia Neg Hx    Hypertension Neg Hx    Social History   Socioeconomic History   Marital status: Single    Spouse name: Not on file   Number of children: 0   Years of education: Not on file   Highest education level: Bachelor's degree (e.g., BA, AB, BS)  Occupational History   Occupation: Mail carrier    Associate Professor: [employer]   Occupation: Technical brewer    Comment: Chiropractor  Tobacco Use   Smoking status: Never   Smokeless tobacco: Never  Vaping Use   Vaping status: Never Used  Substance and Sexual Activity   Alcohol use: No    Alcohol/week: 0.0 standard drinks of alcohol   Drug use: No   Sexual activity: Not Currently  Other Topics Concern   Not on file  Social History Narrative   Lives by himself, has a g-friend    Social Drivers of Corporate investment banker Strain: Low Risk  (07/22/2023)   Overall Financial Resource Strain (CARDIA)    Difficulty of Paying Living Expenses: Not hard at all  Food Insecurity: No Food Insecurity (07/22/2023)   Hunger Vital Sign    Worried About Running Out of Food in the Last Year: Never true    Ran Out of Food in the Last Year: Never true  Transportation Needs: No Transportation Needs (07/22/2023)   PRAPARE - Administrator, Civil Service (Medical): No    Lack of Transportation (Non-Medical): No  Physical Activity: Inactive (07/22/2023)   Exercise Vital Sign    Days of Exercise per Week: 0 days    Minutes of Exercise per Session: 0 min  Stress: No Stress Concern Present  (07/22/2023)   [employer] of Occupational Health - Occupational Stress Questionnaire    Feeling of Stress : Not at all  Social Connections: Moderately Integrated (07/22/2023)   Social Connection and Isolation Panel [NHANES]    Frequency of Communication with Friends and Family: More than three times a week    Frequency of Social Gatherings with Friends and Family: More than three times a week    Attends Religious Services: More than 4 times per year    Active Member of [employer] or Organizations: Yes    Attends Engineer, structural: More than 4 times per year    Marital Status: Never married    Tobacco Counseling Counseling given: Not Answered  Clinical Intake:  Pre-visit preparation completed: Yes  Pain : No/denies pain    BMI -  recorded: 27 Nutritional Status: BMI 25 -29 Overweight Nutritional Risks: None Diabetes: No  Lab Results  Component Value Date   HGBA1C 6.3 06/15/2023   HGBA1C 5.9 04/02/2022   HGBA1C 6.1 06/25/2021     How often do you need to have someone help you when you read instructions, pamphlets, or other written materials from your doctor or pharmacy?: 1 - Never What is the last grade level 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 0 0  Vision? 0 0  Difficulty concentrating or making decisions? 0 0  Walking or climbing stairs? 0 0  Dressing or bathing? 0 0  Doing errands, shopping? 0 0  Preparing Food and eating ? N N  Using the Toilet? N N  In the past six months, have you accidently leaked urine? N N  Do you have problems with loss of bowel control? N N  Managing your Medications? N N  Managing your Finances? N N  Housekeeping or managing your Housekeeping? N N     Patient Care Team: Ezell Hollow, MD as PCP - General (Internal Medicine) Alvis Jourdain, MD as Consulting Physician (Gastroenterology)  Indicate any recent Medical Services you may have received from other than [facility] providers in the past year (date may be approximate).     Assessment:    This is a routine wellness examination for Mell.  Hearing/Vision screen Hearing Screening - Comments:: Denies difficulty Vision Screening - Comments:: Never had   Goals Addressed             This Visit's Progress    Patient Stated       He will work on incorporating intentional walking in daily activities and will look into joining [employer]       Depression Screen     07/22/2023    8:40 AM 06/15/2023    3:56 PM 12/23/2022   10:21 AM 07/13/2022   10:52 AM 04/02/2022    8:28 AM 02/25/2022    8:56 AM 12/01/2021    3:31 PM  PHQ 2/9 Scores  PHQ - 2 Score 0 0 0 0 0 0 0  PHQ- 9 Score 0          Fall Risk     07/22/2023    8:55 AM 07/21/2023    2:41 PM 06/15/2023    3:56 PM 07/13/2022   10:52 AM 04/02/2022    8:28 AM  Fall Risk   Falls in the past year? 0 0 0 0 0  Number falls in past yr: 0  0 0 0  Injury with Fall? 0  0 0 0  Risk for fall due to : No Fall Risks      Follow up Falls prevention discussed  Falls evaluation completed;Education provided Falls evaluation completed Falls evaluation completed    MEDICARE RISK AT HOME:  Medicare Risk at Home Any stairs in or around the home?: Yes If so, are there any without handrails?: No Home free of loose throw rugs in walkways, pet beds, electrical cords, etc?: Yes Adequate lighting in your home to reduce risk of falls?: Yes Life alert?: No Use of a cane, walker or w/c?: No Grab bars in the bathroom?: No  Shower chair or bench in shower?: No Elevated toilet seat or a handicapped toilet?: No  TIMED UP AND GO:  Was the test performed?  No  Cognitive Function: 6CIT completed        07/22/2023    8:59 AM 07/22/2023    8:56 AM   6CIT Screen  What Year? 0 points 0 points  What month? 0 points 0 points  What time? 0 points 0 points  Count back from 20 0 points 0 points  Months in reverse 0 points 0 points  Repeat phrase 0 points   Total Score 0 points     Immunizations Immunization History  Administered Date(s) Administered   Influenza, Mdck, Trivalent,PF 6+ MOS(egg free) 12/23/2022   Influenza,inj,Quad PF,6+ Mos 11/14/2014, 01/20/2016, 11/06/2016, 04/21/2018, 12/06/2018, 12/14/2019, 12/03/2020, 12/01/2021   PFIZER(Purple Top)SARS-COV-2 Vaccination 05/05/2019, 05/29/2019, 03/14/2020   Pfizer Covid-19 Vaccine Bivalent Booster 84yrs & up 12/13/2020   Tdap 10/19/2014, 05/07/2022    Screening Tests Health Maintenance  Topic Date Due   Medicare Annual Wellness (AWV)  Never done   Pneumonia Vaccine 12+ Years old (1 of 1 - PCV) Never done   Zoster Vaccines- Shingrix (1 of 2) Never done   COVID-19 Vaccine (5 - 2024-25 season) 10/25/2022   INFLUENZA VACCINE  09/24/2023   Colonoscopy  08/03/2028   DTaP/Tdap/Td (3 - Td or Tdap) 05/06/2032   Hepatitis C Screening  Completed   HIV Screening  Completed   HPV VACCINES  Aged Out   Meningococcal B Vaccine  Aged Out    Health Maintenance  Health Maintenance Due  Topic Date Due   Medicare Annual Wellness (AWV)  Never done   Pneumonia Vaccine 53+ Years old (1 of 1 - PCV) Never done   Zoster Vaccines- Shingrix (1 of 2) Never done   COVID-19 Vaccine (5 - 2024-25 season) 10/25/2022   Health Maintenance Items Addressed: Pt will get Shingrix and COVID vaccine at pharmacy. He plans to get PCV 20 at next OV.  Additional Screening:  Vision Screening: Recommended annual ophthalmology exams for early detection of glaucoma and other disorders of the eye.  Dental Screening: Recommended annual dental exams for proper oral hygiene  Community Resource Referral / Chronic Care Management: CRR required this visit?  No   CCM required this visit?  No   Plan:    I  have personally reviewed and noted the following in the patient's chart:   Medical and social history Use of alcohol, tobacco or illicit drugs  Current medications and supplements including opioid prescriptions. Patient is not currently taking opioid prescriptions. Functional ability and status Nutritional status Physical activity Advanced directives List of other physicians Hospitalizations, surgeries, and ER visits in previous 12 months Vitals Screenings to include cognitive, depression, and falls Referrals and appointments  In addition, I have reviewed and discussed with patient certain preventive protocols, quality metrics, and best practice recommendations. A written personalized care plan for preventive services as well as general preventive health recommendations were provided to patient.   Susa Engman, CMA   07/22/2023   After Visit Summary: (MyChart) Due to this being a telephonic visit, the after visit summary with patients personalized plan was offered to patient via MyChart   Notes: Nothing significant to report at this time.

## 2023-09-19 ENCOUNTER — Other Ambulatory Visit: Payer: Self-pay | Admitting: Internal Medicine

## 2023-09-21 ENCOUNTER — Other Ambulatory Visit: Payer: Self-pay | Admitting: Internal Medicine

## 2023-10-08 ENCOUNTER — Encounter: Payer: Self-pay | Admitting: Internal Medicine

## 2023-10-08 ENCOUNTER — Ambulatory Visit (INDEPENDENT_AMBULATORY_CARE_PROVIDER_SITE_OTHER): Admitting: Internal Medicine

## 2023-10-08 VITALS — BP 152/90 | HR 91 | Temp 98.1°F | Resp 16 | Ht 75.0 in | Wt 222.2 lb

## 2023-10-08 DIAGNOSIS — M791 Myalgia, unspecified site: Secondary | ICD-10-CM

## 2023-10-08 DIAGNOSIS — I10 Essential (primary) hypertension: Secondary | ICD-10-CM

## 2023-10-08 DIAGNOSIS — E785 Hyperlipidemia, unspecified: Secondary | ICD-10-CM

## 2023-10-08 DIAGNOSIS — R3911 Hesitancy of micturition: Secondary | ICD-10-CM | POA: Diagnosis not present

## 2023-10-08 DIAGNOSIS — R399 Unspecified symptoms and signs involving the genitourinary system: Secondary | ICD-10-CM | POA: Diagnosis not present

## 2023-10-08 MED ORDER — AMLODIPINE BESYLATE 2.5 MG PO TABS: 2.5000 mg | ORAL_TABLET | Freq: Every day | ORAL | 0 refills | Status: DC

## 2023-10-08 NOTE — Assessment & Plan Note (Signed)
 HTN: BP elevated today, ran out of amlodipine .  Had a mild headache this week, that is gone. Plan: Restart amlodipine , BMP, monitor BPs closely. LUTS, pelvic pain: See LOV, UA essentially normal, urine culture normal, PSA normal.  He continue with dysuria, pelvic and lower back discomfort. Plan:  -CT abdomen and pelvis (pending BMP)  - urology referral Hyperlipidemia: Thinks atorvastatin  is causing aches and pains, hold it for now, reassess on RTC. RTC 3 months

## 2023-10-08 NOTE — Patient Instructions (Addendum)
 Go back on amlodipine  Start checking your blood pressure regularly Call me next week with your blood pressure reading  Will order CT of your abdomen  Will refer you to a urologist  Stop the cholesterol medication, when you come back you will let me know if that help with the aches and pains.   GO TO THE LAB :  Get the blood work   Your results will be posted on MyChart with my comments  Next office visit for a checkup in 3 months Please make an appointment before you leave today

## 2023-10-08 NOTE — Progress Notes (Signed)
 Subjective:    Patient ID: Gregory Sweeney, male    DOB: [birth year], 63 y.o.   MRN: [id number]  DOS:  10/08/2023 Type of visit - description: Acute, here with his fiance  Multiple issues: Not taking amlodipine , BP noted to be elevated.  Continue with a number of symptoms: Some dysuria, some flank pain, when asked admits to bilateral low pelvic pain. No fever or chills.  No blood in the urine.  No testicular pain or swelling.  Also, he ran out of atorvastatin  temporarily and noted that his generalized aches and pains got better.  Stop atorvastatin ?.  This week he had a headache, mild, dull, not the worst of his life.  Headache is gone  Review of Systems See above   Past Medical History:  Diagnosis Date   Appendicitis    Bowel obstruction (HCC)    Gastric ulcer    GERD (gastroesophageal reflux disease)    History of kidney stones    Hypertension    not on meds     Past Surgical History:  Procedure Laterality Date   APPENDECTOMY     BALLOON DILATION N/A 05/24/2019   Procedure: BALLOON DILATION;  Surgeon: Rollin Dover, MD;  Location: [facility] ENDOSCOPY;  Service: Endoscopy;  Laterality: N/A;   BOWEL OBSTRUCTION     CHOLECYSTECTOMY     ESOPHAGEAL DILATION     ESOPHAGOGASTRODUODENOSCOPY (EGD) WITH PROPOFOL  N/A 07/21/2013   Procedure: ESOPHAGOGASTRODUODENOSCOPY (EGD) WITH PROPOFOL ;  Surgeon: Dover JONETTA Rollin, MD;  Location: [facility] ENDOSCOPY;  Service: Endoscopy;  Laterality: N/A;   ESOPHAGOGASTRODUODENOSCOPY (EGD) WITH PROPOFOL  N/A 05/24/2019   Procedure: ESOPHAGOGASTRODUODENOSCOPY (EGD) WITH PROPOFOL ;  Surgeon: Rollin Dover, MD;  Location: [facility] ENDOSCOPY;  Service: Endoscopy;  Laterality: N/A;   FOREIGN BODY REMOVAL  05/24/2019   Procedure: FOREIGN BODY REMOVAL;  Surgeon: Rollin Dover, MD;  Location: [facility] ENDOSCOPY;  Service: Endoscopy;;   LITHOTRIPSY     PARATHYROIDECTOMY N/A 12/30/2015   Procedure: NECK EXPLORATION AND PARATHYROIDECTOMY;  Surgeon: Krystal Spinner, MD;  Location: [facility] ORS;   Service: General;  Laterality: N/A;    Current Outpatient Medications  Medication Instructions   amLODipine  (NORVASC ) 2.5 mg, Oral, Daily   EPINEPHrine  (EPIPEN  2-PAK) 0.3 mg, Intramuscular, As needed   tamsulosin  (FLOMAX ) 0.4 mg, Oral, Daily after supper       Objective:   Physical Exam BP (!) 152/90   Pulse 91   Temp 98.1 F (36.7 C) (Oral)   Resp 16   Ht 6' 3 (1.905 m)   Wt 222 lb 4 oz (100.8 kg)   SpO2 97%   BMI 27.78 kg/m  General:   Well developed, NAD, BMI noted.  HEENT:  Normocephalic . Face symmetric, atraumatic Lungs:  CTA B Normal respiratory effort, no intercostal retractions, no accessory muscle use. Heart: RRR,  no murmur.  Abdomen:  Not distended, soft, non-tender. No rebound or rigidity.   Skin: Not pale. Not jaundice Lower extremities: no pretibial edema bilaterally  Neurologic:  alert & oriented X3.  Speech normal, gait appropriate for age and unassisted Psych--  Cognition and judgment appear intact.  Cooperative with normal attention span and concentration.  Behavior appropriate. No anxious or depressed appearing.     Assessment     ASSESSMENT (transferred to this location 08-2020) Hyperglycemia - A1C 6.1 HTN High cholesterol BPH GI: Dr. Dover Pendleton Schatzki ring (sees GI, s/p dilatation) History of exploratory laparotomy at age 86 due to gastric ulcer, h/o  SBO Vitamin D  deficiency BPH, cystoscopy 2019, LUTS.  Sees  urology.  PLAN HTN: BP elevated today, ran out of amlodipine .  Had a mild headache this week, that is gone. Plan: Restart amlodipine , BMP, monitor BPs closely. LUTS, pelvic pain: See LOV, UA essentially normal, urine culture normal, PSA normal.  He continue with dysuria, pelvic and lower back discomfort. Plan:  -CT abdomen and pelvis (pending BMP)  - urology referral Hyperlipidemia: Thinks atorvastatin  is causing aches and pains, hold it for now, reassess on RTC. RTC 3 months

## 2023-10-09 LAB — BASIC METABOLIC PANEL WITH GFR
BUN/Creatinine Ratio: 9 (calc) (ref 6–22)
BUN: 14 mg/dL (ref 7–25)
CO2: 30 mmol/L (ref 20–32)
Calcium: 9.7 mg/dL (ref 8.6–10.3)
Chloride: 101 mmol/L (ref 98–110)
Creat: 1.56 mg/dL — ABNORMAL HIGH (ref 0.70–1.35)
Glucose, Bld: 95 mg/dL (ref 65–99)
Potassium: 4.7 mmol/L (ref 3.5–5.3)
Sodium: 139 mmol/L (ref 135–146)
eGFR: 49 mL/min/1.73m2 — ABNORMAL LOW (ref >=60)

## 2023-10-12 ENCOUNTER — Ambulatory Visit: Payer: Self-pay | Admitting: Internal Medicine

## 2023-10-12 ENCOUNTER — Encounter: Payer: Self-pay | Admitting: Internal Medicine

## 2023-10-12 DIAGNOSIS — R102 Pelvic and perineal pain: Secondary | ICD-10-CM

## 2023-10-12 DIAGNOSIS — R399 Unspecified symptoms and signs involving the genitourinary system: Secondary | ICD-10-CM

## 2023-10-12 DIAGNOSIS — R7989 Other specified abnormal findings of blood chemistry: Secondary | ICD-10-CM

## 2023-10-12 NOTE — Addendum Note (Signed)
 Addended by: Kalonji Zurawski D on: 10/12/2023 01:19 PM   Modules accepted: Orders

## 2023-10-13 ENCOUNTER — Ambulatory Visit (HOSPITAL_BASED_OUTPATIENT_CLINIC_OR_DEPARTMENT_OTHER)
Admission: RE | Admit: 2023-10-13 | Discharge: 2023-10-13 | Disposition: A | Discharge status: Home / Self Care | Source: Ambulatory Visit | Attending: Internal Medicine | Admitting: Internal Medicine

## 2023-10-13 DIAGNOSIS — R399 Unspecified symptoms and signs involving the genitourinary system: Secondary | ICD-10-CM | POA: Insufficient documentation

## 2023-10-13 DIAGNOSIS — N400 Enlarged prostate without lower urinary tract symptoms: Secondary | ICD-10-CM | POA: Diagnosis not present

## 2023-10-13 DIAGNOSIS — R102 Pelvic and perineal pain: Secondary | ICD-10-CM | POA: Diagnosis not present

## 2023-10-13 DIAGNOSIS — N200 Calculus of kidney: Secondary | ICD-10-CM | POA: Diagnosis not present

## 2023-10-26 ENCOUNTER — Other Ambulatory Visit: Payer: Self-pay

## 2023-10-26 DIAGNOSIS — I10 Essential (primary) hypertension: Secondary | ICD-10-CM

## 2023-10-26 NOTE — Addendum Note (Signed)
 Addended by: Kimbree Casanas D on: 10/26/2023 07:36 AM   Modules accepted: Orders

## 2023-10-27 ENCOUNTER — Other Ambulatory Visit

## 2023-10-28 LAB — CBC: RBC: 4.63 (ref 3.87–5.11)

## 2023-10-28 LAB — PSA: PSA: 0.8

## 2023-10-28 LAB — COMPREHENSIVE METABOLIC PANEL WITH GFR
Albumin: 4.3 (ref 3.5–5.0)
Calcium: 9 (ref 8.7–10.7)
Globulin: 2.7
eGFR: 71

## 2023-10-28 LAB — LIPID PANEL
Cholesterol: 134 (ref 0–200)
HDL: 40 (ref 35–70)
LDL Cholesterol: 78
Triglycerides: 76 (ref 40–160)

## 2023-10-28 LAB — TSH: TSH: 2.47 (ref 0.41–5.90)

## 2023-10-28 LAB — BASIC METABOLIC PANEL WITH GFR: Glucose: 95

## 2023-10-28 LAB — IRON,TIBC AND FERRITIN PANEL
%SAT: 25
Ferritin: 69
Iron: 86
TIBC: 345

## 2023-10-28 LAB — HEPATIC FUNCTION PANEL
ALT: 23 U/L (ref 10–40)
AST: 20 (ref 14–40)
Alkaline Phosphatase: 49 (ref 25–125)
Bilirubin, Direct: 0.2 (ref 0.01–0.4)
Bilirubin, Total: 0.7

## 2023-10-28 LAB — CBC AND DIFFERENTIAL
HCT: 43 (ref 41–53)
Hemoglobin: 14.1 (ref 13.5–17.5)
Neutrophils Absolute: 3010
Platelets: 233 K/uL (ref 150–400)
WBC: 5.7

## 2023-10-28 LAB — HEMOGLOBIN A1C: Hemoglobin A1C: 6.2

## 2023-10-28 LAB — VITAMIN D 25 HYDROXY (VIT D DEFICIENCY, FRACTURES): Vit D, 25-Hydroxy: 46

## 2023-11-15 ENCOUNTER — Encounter: Payer: Self-pay | Admitting: Internal Medicine

## 2023-11-16 ENCOUNTER — Encounter: Payer: Self-pay | Admitting: Internal Medicine

## 2023-11-16 ENCOUNTER — Ambulatory Visit (INDEPENDENT_AMBULATORY_CARE_PROVIDER_SITE_OTHER): Admitting: Internal Medicine

## 2023-11-16 VITALS — BP 132/80 | HR 74 | Temp 97.8°F | Resp 16 | Ht 75.0 in | Wt 221.4 lb

## 2023-11-16 DIAGNOSIS — Z23 Encounter for immunization: Secondary | ICD-10-CM

## 2023-11-16 DIAGNOSIS — I10 Essential (primary) hypertension: Secondary | ICD-10-CM

## 2023-11-16 DIAGNOSIS — E785 Hyperlipidemia, unspecified: Secondary | ICD-10-CM

## 2023-11-16 NOTE — Progress Notes (Unsigned)
 Subjective:    Patient ID: Gregory Sweeney, male    DOB: 09/25/1960, 63 y.o.   MRN: [id number]  DOS:  11/16/2023 Type of visit - description: Follow-up  Since the last office visit is feeling great.  Has no concerns. Ambulatory BPs WNL.  Review of Systems See above   Past Medical History:  Diagnosis Date   Appendicitis    Bowel obstruction (HCC)    Gastric ulcer    GERD (gastroesophageal reflux disease)    History of kidney stones    Hypertension    not on meds     Past Surgical History:  Procedure Laterality Date   APPENDECTOMY     BALLOON DILATION N/A 05/24/2019   Procedure: BALLOON DILATION;  Surgeon: Rollin Dover, MD;  Location: [facility] ENDOSCOPY;  Service: Endoscopy;  Laterality: N/A;   BOWEL OBSTRUCTION     CHOLECYSTECTOMY     ESOPHAGEAL DILATION     ESOPHAGOGASTRODUODENOSCOPY (EGD) WITH PROPOFOL  N/A 07/21/2013   Procedure: ESOPHAGOGASTRODUODENOSCOPY (EGD) WITH PROPOFOL ;  Surgeon: Dover JONETTA Rollin, MD;  Location: [facility] ENDOSCOPY;  Service: Endoscopy;  Laterality: N/A;   ESOPHAGOGASTRODUODENOSCOPY (EGD) WITH PROPOFOL  N/A 05/24/2019   Procedure: ESOPHAGOGASTRODUODENOSCOPY (EGD) WITH PROPOFOL ;  Surgeon: Rollin Dover, MD;  Location: [facility] ENDOSCOPY;  Service: Endoscopy;  Laterality: N/A;   FOREIGN BODY REMOVAL  05/24/2019   Procedure: FOREIGN BODY REMOVAL;  Surgeon: Rollin Dover, MD;  Location: [facility] ENDOSCOPY;  Service: Endoscopy;;   LITHOTRIPSY     PARATHYROIDECTOMY N/A 12/30/2015   Procedure: NECK EXPLORATION AND PARATHYROIDECTOMY;  Surgeon: Krystal Spinner, MD;  Location: [facility] ORS;  Service: General;  Laterality: N/A;    Current Outpatient Medications  Medication Instructions   amLODipine  (NORVASC ) 2.5 mg, Oral, Daily   EPINEPHrine  (EPIPEN  2-PAK) 0.3 mg, Intramuscular, As needed   tamsulosin  (FLOMAX ) 0.4 mg, Oral, Daily after supper       Objective:   Physical Exam BP 132/80   Pulse 74   Temp 97.8 F (36.6 C) (Oral)   Resp 16   Ht 6' 3 (1.905 m)   Wt 221 lb 6 oz (100.4  kg)   SpO2 96%   BMI 27.67 kg/m  General:   Well developed, NAD, BMI noted. HEENT:  Normocephalic . Face symmetric, atraumatic Lungs:  CTA B Normal respiratory effort, no intercostal retractions, no accessory muscle use. Heart: RRR,  no murmur.  Lower extremities: no pretibial edema bilaterally  Skin: Not pale. Not jaundice Neurologic:  alert & oriented X3.  Speech normal, gait appropriate for age and unassisted Psych--  Cognition and judgment appear intact.  Cooperative with normal attention span and concentration.  Behavior appropriate. No anxious or depressed appearing.      Assessment      ASSESSMENT (transferred to this location 08-2020) Hyperglycemia - A1C 6.1 HTN High cholesterol BPH GI: Dr. Dover Pendleton Schatzki ring (sees GI, s/p dilatation) History of exploratory laparotomy at age 58 due to gastric ulcer, h/o  SBO Vitamin D  deficiency BPH, cystoscopy 2019, LUTS.  Sees urology.  PLAN HTN: See LOV, was out of amlodipine , it was restarted, BP today looks good today, ambulatory BPs 120/70. Last BMP showed slight increase creatinine, will recheck today.  Continue with ambulatory BPs LUTS, pelvic pain: See LOV, CT abdomen pelvis, no acute.  Urology eval pending, recommend to call and set an appointment High cholesterol: Not taking atorvastatin  due to aches and pains.  Lifestyle: To start a program called 40 days healthier nutrition, aim to improve his lifestyle.  Reports he had extensive  blood work last week before the program.  Asked him to send me copies I am interesting to see his FLP and A1c. Preventive care: Flu shot today, recommend a COVID booster Addendum: I just received results from 10/28/2023: A1c 6.2, LFTs normal, creatinine 1.1, total cholesterol 134, LDL 78.  Hemoglobin and platelet count normal PSA 0.8 vitamin D  normal Will discuss results on RTC RTC 3 to 4 months CPX

## 2023-11-16 NOTE — Telephone Encounter (Signed)
 Labs abstracted

## 2023-11-16 NOTE — Patient Instructions (Addendum)
 You got a flu shot today. Recommend a COVID booster  Continue taking the same medications including amlodipine  Check the  blood pressure regularly Blood pressure goal:  between 110/65 and  135/85. If it is consistently higher or lower, let me know  Please get the results of the blood work done last week.   You are referred to [facility]. Please call them and set up a appointment [phone]   GO TO THE LAB :  Get the blood work   Your results will be posted on MyChart with my comments  Go to the front desk for the checkout Please make an appointment for a physical exam in 3 to 4 months    STOP BY THE FIRST FLOOR:  get the XR

## 2023-11-17 ENCOUNTER — Ambulatory Visit: Payer: Self-pay | Admitting: Internal Medicine

## 2023-11-17 DIAGNOSIS — E785 Hyperlipidemia, unspecified: Secondary | ICD-10-CM | POA: Insufficient documentation

## 2023-11-17 LAB — BASIC METABOLIC PANEL WITH GFR
BUN: 19 mg/dL (ref 6–23)
CO2: 28 meq/L (ref 19–32)
Calcium: 9.4 mg/dL (ref 8.4–10.5)
Chloride: 105 meq/L (ref 96–112)
Creatinine, Ser: 1.4 mg/dL (ref 0.40–1.50)
GFR: 52.7 mL/min — ABNORMAL LOW (ref >60.00)
Glucose, Bld: 90 mg/dL (ref 70–99)
Potassium: 4.5 meq/L (ref 3.5–5.1)
Sodium: 141 meq/L (ref 135–145)

## 2023-11-17 NOTE — Assessment & Plan Note (Signed)
 HTN: See LOV, was out of amlodipine , it was restarted, BP today looks good today, ambulatory BPs 120/70. Last BMP showed slight increase creatinine, will recheck today.  Continue with ambulatory BPs LUTS, pelvic pain: See LOV, CT abdomen pelvis, no acute.  Urology eval pending, recommend to call and set an appointment High cholesterol: Not taking atorvastatin  due to aches and pains.  Lifestyle: To start a program called 40 days healthier nutrition, aim to improve his lifestyle.  Reports he had extensive blood work last week before the program.  Asked him to send me copies I am interesting to see his FLP and A1c. Preventive care: Flu shot today, recommend a COVID booster Addendum: I just received results from 10/28/2023: A1c 6.2, LFTs normal, creatinine 1.1, total cholesterol 134, LDL 78.  Hemoglobin and platelet count normal PSA 0.8 vitamin D  normal Will discuss results on RTC RTC 3 to 4 months CPX

## 2023-11-19 DIAGNOSIS — R3912 Poor urinary stream: Secondary | ICD-10-CM | POA: Diagnosis not present

## 2023-11-19 DIAGNOSIS — N401 Enlarged prostate with lower urinary tract symptoms: Secondary | ICD-10-CM | POA: Diagnosis not present

## 2023-11-23 ENCOUNTER — Other Ambulatory Visit: Payer: Self-pay | Admitting: Internal Medicine

## 2023-12-10 ENCOUNTER — Encounter: Payer: Self-pay | Admitting: Internal Medicine

## 2024-01-03 ENCOUNTER — Other Ambulatory Visit: Payer: Self-pay | Admitting: Internal Medicine

## 2024-02-15 ENCOUNTER — Ambulatory Visit: Admitting: Internal Medicine

## 2024-02-15 ENCOUNTER — Other Ambulatory Visit (HOSPITAL_BASED_OUTPATIENT_CLINIC_OR_DEPARTMENT_OTHER): Payer: Self-pay

## 2024-02-15 ENCOUNTER — Encounter: Payer: Self-pay | Admitting: Internal Medicine

## 2024-02-15 VITALS — BP 124/80 | HR 82 | Temp 97.9°F | Resp 16 | Ht 75.0 in | Wt 200.0 lb

## 2024-02-15 DIAGNOSIS — R739 Hyperglycemia, unspecified: Secondary | ICD-10-CM

## 2024-02-15 DIAGNOSIS — Z0001 Encounter for general adult medical examination with abnormal findings: Secondary | ICD-10-CM | POA: Diagnosis not present

## 2024-02-15 DIAGNOSIS — Z0000 Encounter for general adult medical examination without abnormal findings: Secondary | ICD-10-CM

## 2024-02-15 DIAGNOSIS — R399 Unspecified symptoms and signs involving the genitourinary system: Secondary | ICD-10-CM

## 2024-02-15 DIAGNOSIS — I10 Essential (primary) hypertension: Secondary | ICD-10-CM

## 2024-02-15 DIAGNOSIS — E785 Hyperlipidemia, unspecified: Secondary | ICD-10-CM | POA: Diagnosis not present

## 2024-02-15 LAB — LIPID PANEL
Cholesterol: 153 mg/dL (ref 28–200)
HDL: 44.5 mg/dL
LDL Cholesterol: 93 mg/dL (ref 10–99)
NonHDL: 108.75
Total CHOL/HDL Ratio: 3
Triglycerides: 79 mg/dL (ref 10.0–149.0)
VLDL: 15.8 mg/dL (ref 0.0–40.0)

## 2024-02-15 LAB — HEMOGLOBIN A1C: Hgb A1c MFr Bld: 6 % (ref 4.6–6.5)

## 2024-02-15 MED ORDER — TAMSULOSIN HCL 0.4 MG PO CAPS: 0.4000 mg | ORAL_CAPSULE | Freq: Every day | ORAL | 1 refills | Status: AC

## 2024-02-15 MED ORDER — SHINGRIX 50 MCG/0.5ML IM SUSR
0.5000 mL | Freq: Once | INTRAMUSCULAR | 0 refills | Status: AC
  Filled 2024-02-15: qty 0.5, 1d supply, fill #0

## 2024-02-15 MED ORDER — COMIRNATY 30 MCG/0.3ML IM SUSY
0.3000 mL | PREFILLED_SYRINGE | Freq: Once | INTRAMUSCULAR | 0 refills | Status: AC
  Filled 2024-02-15: qty 0.3, 1d supply, fill #0

## 2024-02-15 NOTE — Assessment & Plan Note (Signed)
 Here for CPX Tdap 2024 Had a flu shot Vaccines I recommend: COVID, Shingrix .   CCS: multiple cscopes , last colonoscopy 08/04/2018, next 10 years per report. Prostate cancer screening:   Recent PSA normal Available labs reviewed, will get a FLP A1c and LP(a) Doing great with lifestyle

## 2024-02-15 NOTE — Patient Instructions (Signed)
 GO TO THE LAB :  Get the blood work    Then, go to the front desk for the checkout Please make an appointment for a checkup in 6 months   Vaccines are recommended COVID booster Shingrix  (shingles shot  Okay to hold amlodipine  but continue checking your blood pressure regularly Blood pressure goal:  between 110/65 and  135/85. If it is consistently higher or lower, let me know    HYPERTENSION: Your blood pressure is well-controlled with your current diet, and you are not taking your medication regularly. -Hold off on taking amlodipine  for now. -Monitor your blood pressure at home twice a week. -Continue with your current diet and lifestyle modifications.  HYPERLIPIDEMIA: You are not currently taking medication for your cholesterol, and your 10-year cardiovascular risk is 14.5%. -A lipid panel and LPA test have been ordered. -We will discuss the results and the potential need for cholesterol medication after the test results come in.  NEPHROLITHIASIS: You have kidney stones that are currently not causing any symptoms. -Continue to monitor for any symptoms of kidney stones. -We will obtain records from your urologist for further evaluation.  I sent a prescription for Flomax 

## 2024-02-15 NOTE — Assessment & Plan Note (Signed)
 Here for CPX Other issues HTN: Has amlodipine  but has not been taking it.  Home blood pressure readings are good. We agree on: Hold amlodipine  and monitor blood pressure twice a week.  Dyslipidemia: History of aches and pains with atorvastatin .  10-year CV risk 14.5%  >>>   It indicates the need for statins, he is aware, at this point he is not inclined to take medication but will order lipid lipid panel and LP(a) at his request. Further advised for results Nephrolithiasis 5 mm and 8 mm kidney stones noted on recent CT.  Asymptomatic. LUTS: See previous entries, he eventually saw urology, he is not sure if he was recommended to continue tamsulosin .  Will get urology records.  Not taking tamsulosin  regularly but request a refill.  Sent. RTC 6 months

## 2024-02-15 NOTE — Progress Notes (Signed)
 "  Subjective:    Patient ID: Gregory Sweeney, male    DOB: [birth year], 63 y.o.   MRN: [id number]  DOS:  02/15/2024 CPX  Discussed the use of AI scribe software for clinical note transcription with the patient, who gave verbal consent to proceed.  History of Present Illness HARL Sweeney is a 63 year old male who presents for an annual physical exam.  General health status - Feels well overall on a new diet - No fever, chest pain, shortness of breath, nausea, vomiting, diarrhea, or blood in stools  Hypertension - Not taking blood pressure medication consistently - Monitors blood pressure at home with good readings  Lower urinary tract symptoms - Has not taken Flomax  for about two months due to running out of medication - No current urinary symptoms - Adequate urinary stream  Nephrolithiasis and musculoskeletal pain - Pelvic and back pain in the past few months - Urologist identified kidney stones measuring 5 mm and 8 mm   Post-parathyroidectomy status - History of parathyroidectomy in 2017 - No current symptoms related to parathyroid  disease  Hyperlipidemia - Not taking available cholesterol medication    Review of Systems  Other than above, a 14 point review of systems is negative     Past Medical History:  Diagnosis Date   Appendicitis    Bowel obstruction (HCC)    Gastric ulcer    GERD (gastroesophageal reflux disease)    History of kidney stones    Hypertension    not on meds     Past Surgical History:  Procedure Laterality Date   APPENDECTOMY     BALLOON DILATION N/A 05/24/2019   Procedure: BALLOON DILATION;  Surgeon: Rollin Dover, MD;  Location: [facility] ENDOSCOPY;  Service: Endoscopy;  Laterality: N/A;   BOWEL OBSTRUCTION     CHOLECYSTECTOMY     ESOPHAGEAL DILATION     ESOPHAGOGASTRODUODENOSCOPY (EGD) WITH PROPOFOL  N/A 07/21/2013   Procedure: ESOPHAGOGASTRODUODENOSCOPY (EGD) WITH PROPOFOL ;  Surgeon: Dover JONETTA Rollin, MD;  Location: [facility] ENDOSCOPY;  Service:  Endoscopy;  Laterality: N/A;   ESOPHAGOGASTRODUODENOSCOPY (EGD) WITH PROPOFOL  N/A 05/24/2019   Procedure: ESOPHAGOGASTRODUODENOSCOPY (EGD) WITH PROPOFOL ;  Surgeon: Rollin Dover, MD;  Location[facility] ENDOSCOPY;  Service: Endoscopy;  Laterality: N/A;   FOREIGN BODY REMOVAL  05/24/2019   Procedure: FOREIGN BODY REMOVAL;  Surgeon: Rollin Dover, MD;  Location: [facility] ENDOSCOPY;  Service: Endoscopy;;   LITHOTRIPSY     PARATHYROIDECTOMY N/A 12/30/2015   Procedure: NECK EXPLORATION AND PARATHYROIDECTOMY;  Surgeon: Krystal Spinner, MD;  Location: [facility] ORS;  Service: General;  Laterality: N/A;    Current Outpatient Medications  Medication Instructions   amLODipine  (NORVASC ) 2.5 mg, Oral, Daily   COVID-19 mRNA vaccine, Pfizer, (COMIRNATY ) syringe 0.3 mLs, Intramuscular,  Once   EPINEPHrine  (EPIPEN  2-PAK) 0.3 mg, Intramuscular, As needed   tamsulosin  (FLOMAX ) 0.4 mg, Oral, Daily after supper   Zoster Vaccine Adjuvanted (SHINGRIX ) injection 0.5 mLs, Intramuscular,  Once       Objective:   Physical Exam BP 124/80   Pulse 82   Temp 97.9 F (36.6 C) (Oral)   Resp 16   Ht 6' 3 (1.905 m)   Wt 200 lb (90.7 kg)   SpO2 97%   BMI 25.00 kg/m  General: Well developed, NAD, BMI noted Neck: No  thyromegaly  HEENT:  Normocephalic . Face symmetric, atraumatic Lungs:  CTA B Normal respiratory effort, no intercostal retractions, no accessory muscle use. Heart: RRR,  no murmur.  Abdomen:  Not distended, soft, non-tender. No  rebound or rigidity.   Lower extremities: no pretibial edema bilaterally  Skin: Exposed areas without rash. Not pale. Not jaundice Neurologic:  alert & oriented X3.  Speech normal, gait appropriate for age and unassisted Strength symmetric and appropriate for age.  Psych: Cognition and judgment appear intact.  Cooperative with normal attention span and concentration.  Behavior appropriate. No anxious or depressed appearing.     Assessment    Problem list (transferred to this  location 08-2020) Hyperglycemia - A1C 6.1 HTN High cholesterol BPH GI: Dr. Belvie Pendleton Schatzki ring (sees GI, s/p dilatation) History of exploratory laparotomy at age 33 due to gastric ulcer, h/o  SBO Vitamin D  deficiency BPH, cystoscopy 2019, LUTS.  Sees urology. Nephrolithiasis per CT 2025 Parathyroidectomy 2027  Assessment & Plan Here for CPX Tdap 2024 Had a flu shot Vaccines I recommend: COVID, Shingrix .   CCS: multiple cscopes , last colonoscopy 08/04/2018, next 10 years per report. Prostate cancer screening:   Recent PSA normal Available labs reviewed, will get a FLP A1c and LP(a) Doing great with lifestyle  Other issues HTN: Has amlodipine  but has not been taking it.  Home blood pressure readings are good. We agree on: Hold amlodipine  and monitor blood pressure twice a week.  Dyslipidemia: History of aches and pains with atorvastatin .  10-year CV risk 14.5%  >>>   It indicates the need for statins, he is aware, at this point he is not inclined to take medication but will order lipid lipid panel and LP(a) at his request. Further advised for results Nephrolithiasis 5 mm and 8 mm kidney stones noted on recent CT.  Asymptomatic. LUTS: See previous entries, he eventually saw urology, he is not sure if he was recommended to continue tamsulosin .  Will get urology records.  Not taking tamsulosin  regularly but request a refill.  Sent. RTC 6 months     "

## 2024-02-20 LAB — LIPOPROTEIN A (LPA): Lipoprotein (a): 77 nmol/L — ABNORMAL HIGH

## 2024-02-22 ENCOUNTER — Ambulatory Visit: Payer: Self-pay | Admitting: Internal Medicine

## 2024-03-06 ENCOUNTER — Encounter: Payer: Self-pay | Admitting: Internal Medicine

## 2024-07-25 ENCOUNTER — Ambulatory Visit

## 2024-08-21 ENCOUNTER — Ambulatory Visit: Admitting: Internal Medicine
